# Patient Record
Sex: Female | Born: 1935 | Race: White | Hispanic: No | Marital: Married | Smoking: Never smoker
Health system: Southern US, Community
[De-identification: ages and names within clinical notes are randomized; demographics above are authoritative.]

## PROBLEM LIST (undated history)

## (undated) DIAGNOSIS — M1990 Unspecified osteoarthritis, unspecified site: Secondary | ICD-10-CM

## (undated) DIAGNOSIS — E785 Hyperlipidemia, unspecified: Secondary | ICD-10-CM

## (undated) DIAGNOSIS — Z90710 Acquired absence of both cervix and uterus: Secondary | ICD-10-CM

## (undated) DIAGNOSIS — K648 Other hemorrhoids: Secondary | ICD-10-CM

## (undated) DIAGNOSIS — E559 Vitamin D deficiency, unspecified: Secondary | ICD-10-CM

## (undated) DIAGNOSIS — R51 Headache: Secondary | ICD-10-CM

## (undated) DIAGNOSIS — I951 Orthostatic hypotension: Secondary | ICD-10-CM

## (undated) DIAGNOSIS — K5900 Constipation, unspecified: Secondary | ICD-10-CM

## (undated) DIAGNOSIS — J309 Allergic rhinitis, unspecified: Secondary | ICD-10-CM

## (undated) DIAGNOSIS — K599 Functional intestinal disorder, unspecified: Secondary | ICD-10-CM

## (undated) DIAGNOSIS — I872 Venous insufficiency (chronic) (peripheral): Secondary | ICD-10-CM

## (undated) DIAGNOSIS — I2510 Atherosclerotic heart disease of native coronary artery without angina pectoris: Secondary | ICD-10-CM

## (undated) DIAGNOSIS — I253 Aneurysm of heart: Secondary | ICD-10-CM

## (undated) DIAGNOSIS — Z800 Family history of malignant neoplasm of digestive organs: Secondary | ICD-10-CM

## (undated) DIAGNOSIS — I10 Essential (primary) hypertension: Secondary | ICD-10-CM

## (undated) DIAGNOSIS — F32A Depression, unspecified: Secondary | ICD-10-CM

## (undated) DIAGNOSIS — K219 Gastro-esophageal reflux disease without esophagitis: Secondary | ICD-10-CM

## (undated) DIAGNOSIS — I071 Rheumatic tricuspid insufficiency: Secondary | ICD-10-CM

## (undated) DIAGNOSIS — G8929 Other chronic pain: Secondary | ICD-10-CM

## (undated) DIAGNOSIS — I6523 Occlusion and stenosis of bilateral carotid arteries: Secondary | ICD-10-CM

## (undated) DIAGNOSIS — I87009 Postthrombotic syndrome without complications of unspecified extremity: Secondary | ICD-10-CM

## (undated) DIAGNOSIS — F329 Major depressive disorder, single episode, unspecified: Secondary | ICD-10-CM

## (undated) DIAGNOSIS — I83899 Varicose veins of unspecified lower extremities with other complications: Secondary | ICD-10-CM

## (undated) DIAGNOSIS — R519 Headache, unspecified: Secondary | ICD-10-CM

## (undated) DIAGNOSIS — I4891 Unspecified atrial fibrillation: Secondary | ICD-10-CM

## (undated) DIAGNOSIS — E039 Hypothyroidism, unspecified: Secondary | ICD-10-CM

## (undated) DIAGNOSIS — K625 Hemorrhage of anus and rectum: Secondary | ICD-10-CM

## (undated) DIAGNOSIS — F419 Anxiety disorder, unspecified: Secondary | ICD-10-CM

## (undated) HISTORY — DX: Constipation, unspecified: K59.00

## (undated) HISTORY — DX: Acquired absence of both cervix and uterus: Z90.710

## (undated) HISTORY — DX: Postthrombotic syndrome without complications of unspecified extremity: I87.009

## (undated) HISTORY — DX: Headache, unspecified: R51.9

## (undated) HISTORY — PX: TONSILLECTOMY: SUR1361

## (undated) HISTORY — PX: CARDIAC CATHETERIZATION: SHX172

## (undated) HISTORY — DX: Other hemorrhoids: K64.8

## (undated) HISTORY — PX: NASAL SINUS SURGERY: SHX719

## (undated) HISTORY — PX: KNEE SURGERY: SHX244

## (undated) HISTORY — PX: GANGLION CYST EXCISION: SHX1691

## (undated) HISTORY — DX: Venous insufficiency (chronic) (peripheral): I87.2

## (undated) HISTORY — DX: Varicose veins of unspecified lower extremity with other complications: I83.899

## (undated) HISTORY — DX: Essential (primary) hypertension: I10

## (undated) HISTORY — DX: Depression, unspecified: F32.A

## (undated) HISTORY — DX: Major depressive disorder, single episode, unspecified: F32.9

## (undated) HISTORY — DX: Headache: R51

## (undated) HISTORY — DX: Orthostatic hypotension: I95.1

## (undated) HISTORY — DX: Occlusion and stenosis of bilateral carotid arteries: I65.23

## (undated) HISTORY — DX: Hyperlipidemia, unspecified: E78.5

## (undated) HISTORY — PX: CERVICAL SPINE SURGERY: SHX589

## (undated) HISTORY — DX: Other chronic pain: G89.29

## (undated) HISTORY — DX: Unspecified osteoarthritis, unspecified site: M19.90

## (undated) HISTORY — PX: OTHER SURGICAL HISTORY: SHX169

## (undated) HISTORY — DX: Hemorrhage of anus and rectum: K62.5

## (undated) HISTORY — DX: Unspecified atrial fibrillation: I48.91

## (undated) HISTORY — DX: Allergic rhinitis, unspecified: J30.9

## (undated) HISTORY — DX: Atherosclerotic heart disease of native coronary artery without angina pectoris: I25.10

## (undated) HISTORY — DX: Family history of malignant neoplasm of digestive organs: Z80.0

## (undated) HISTORY — PX: CATARACT EXTRACTION W/ INTRAOCULAR LENS IMPLANT: SHX1309

## (undated) HISTORY — PX: APPENDECTOMY: SHX54

## (undated) HISTORY — DX: Functional intestinal disorder, unspecified: K59.9

## (undated) HISTORY — DX: Vitamin D deficiency, unspecified: E55.9

## (undated) HISTORY — DX: Rheumatic tricuspid insufficiency: I07.1

## (undated) HISTORY — DX: Gastro-esophageal reflux disease without esophagitis: K21.9

## (undated) HISTORY — PX: TOTAL KNEE ARTHROPLASTY: SHX125

## (undated) HISTORY — DX: Anxiety disorder, unspecified: F41.9

## (undated) HISTORY — DX: Aneurysm of heart: I25.3

## (undated) HISTORY — DX: Hypothyroidism, unspecified: E03.9

## (undated) HISTORY — PX: ABDOMINAL HYSTERECTOMY: SHX81

---

## 2001-07-31 ENCOUNTER — Other Ambulatory Visit: Admission: RE | Admit: 2001-07-31 | Discharge: 2001-07-31 | Payer: Self-pay | Admitting: Family Medicine

## 2001-08-25 ENCOUNTER — Encounter: Payer: Self-pay | Admitting: Orthopedic Surgery

## 2001-09-02 ENCOUNTER — Inpatient Hospital Stay (HOSPITAL_COMMUNITY): Admission: RE | Admit: 2001-09-02 | Discharge: 2001-09-07 | Payer: Self-pay | Admitting: Orthopedic Surgery

## 2001-10-15 ENCOUNTER — Encounter: Admission: RE | Admit: 2001-10-15 | Discharge: 2001-11-12 | Payer: Self-pay | Admitting: Orthopedic Surgery

## 2002-06-18 ENCOUNTER — Encounter: Admission: RE | Admit: 2002-06-18 | Discharge: 2002-06-18 | Payer: Self-pay | Admitting: Family Medicine

## 2002-06-18 ENCOUNTER — Encounter: Payer: Self-pay | Admitting: Family Medicine

## 2002-11-10 ENCOUNTER — Encounter: Payer: Self-pay | Admitting: Gastroenterology

## 2003-04-16 ENCOUNTER — Ambulatory Visit (HOSPITAL_COMMUNITY): Admission: RE | Admit: 2003-04-16 | Discharge: 2003-04-16 | Payer: Self-pay | Admitting: Family Medicine

## 2003-04-16 ENCOUNTER — Encounter: Payer: Self-pay | Admitting: Family Medicine

## 2003-07-01 ENCOUNTER — Encounter: Admission: RE | Admit: 2003-07-01 | Discharge: 2003-07-01 | Payer: Self-pay | Admitting: Family Medicine

## 2003-07-01 ENCOUNTER — Encounter: Payer: Self-pay | Admitting: Family Medicine

## 2003-07-19 ENCOUNTER — Ambulatory Visit (HOSPITAL_COMMUNITY): Admission: RE | Admit: 2003-07-19 | Discharge: 2003-07-19 | Payer: Self-pay | Admitting: Cardiology

## 2003-07-19 ENCOUNTER — Encounter: Payer: Self-pay | Admitting: Cardiology

## 2003-08-30 ENCOUNTER — Encounter: Payer: Self-pay | Admitting: Gastroenterology

## 2003-09-13 ENCOUNTER — Ambulatory Visit (HOSPITAL_COMMUNITY): Admission: RE | Admit: 2003-09-13 | Discharge: 2003-09-13 | Payer: Self-pay | Admitting: Gastroenterology

## 2003-09-13 ENCOUNTER — Encounter: Payer: Self-pay | Admitting: Gastroenterology

## 2004-07-06 ENCOUNTER — Encounter: Admission: RE | Admit: 2004-07-06 | Discharge: 2004-07-06 | Payer: Self-pay | Admitting: Family Medicine

## 2004-11-08 ENCOUNTER — Ambulatory Visit: Payer: Self-pay | Admitting: Family Medicine

## 2004-12-18 ENCOUNTER — Ambulatory Visit: Payer: Self-pay | Admitting: Family Medicine

## 2005-03-09 ENCOUNTER — Inpatient Hospital Stay (HOSPITAL_COMMUNITY): Admission: EM | Admit: 2005-03-09 | Discharge: 2005-03-11 | Payer: Self-pay | Admitting: Emergency Medicine

## 2005-03-09 ENCOUNTER — Ambulatory Visit: Payer: Self-pay | Admitting: Cardiology

## 2005-03-11 ENCOUNTER — Ambulatory Visit: Payer: Self-pay

## 2005-03-13 ENCOUNTER — Ambulatory Visit: Payer: Self-pay | Admitting: Cardiology

## 2005-03-20 ENCOUNTER — Ambulatory Visit: Payer: Self-pay | Admitting: Cardiology

## 2005-04-16 ENCOUNTER — Ambulatory Visit: Payer: Self-pay | Admitting: Family Medicine

## 2005-04-16 ENCOUNTER — Encounter: Admission: RE | Admit: 2005-04-16 | Discharge: 2005-04-16 | Payer: Self-pay | Admitting: Family Medicine

## 2005-08-01 ENCOUNTER — Ambulatory Visit: Payer: Self-pay | Admitting: Gastroenterology

## 2005-08-02 ENCOUNTER — Encounter: Admission: RE | Admit: 2005-08-02 | Discharge: 2005-08-02 | Payer: Self-pay | Admitting: Family Medicine

## 2005-08-06 ENCOUNTER — Ambulatory Visit: Payer: Self-pay | Admitting: Cardiology

## 2005-09-03 ENCOUNTER — Ambulatory Visit: Payer: Self-pay | Admitting: Gastroenterology

## 2005-09-10 ENCOUNTER — Ambulatory Visit: Payer: Self-pay | Admitting: Gastroenterology

## 2005-09-24 ENCOUNTER — Ambulatory Visit: Payer: Self-pay | Admitting: Gastroenterology

## 2005-10-08 ENCOUNTER — Encounter (INDEPENDENT_AMBULATORY_CARE_PROVIDER_SITE_OTHER): Payer: Self-pay | Admitting: *Deleted

## 2005-10-08 ENCOUNTER — Ambulatory Visit (HOSPITAL_COMMUNITY): Admission: RE | Admit: 2005-10-08 | Discharge: 2005-10-08 | Payer: Self-pay | Admitting: Obstetrics and Gynecology

## 2005-12-18 ENCOUNTER — Ambulatory Visit: Payer: Self-pay | Admitting: Family Medicine

## 2006-02-20 ENCOUNTER — Encounter: Admission: RE | Admit: 2006-02-20 | Discharge: 2006-02-20 | Payer: Self-pay | Admitting: Otolaryngology

## 2006-07-10 ENCOUNTER — Ambulatory Visit: Payer: Self-pay | Admitting: Cardiology

## 2006-08-05 ENCOUNTER — Encounter: Admission: RE | Admit: 2006-08-05 | Discharge: 2006-08-05 | Payer: Self-pay | Admitting: Family Medicine

## 2006-10-20 ENCOUNTER — Ambulatory Visit: Payer: Self-pay | Admitting: Gastroenterology

## 2006-10-21 ENCOUNTER — Ambulatory Visit: Payer: Self-pay | Admitting: Gastroenterology

## 2006-11-12 ENCOUNTER — Ambulatory Visit: Payer: Self-pay | Admitting: Gastroenterology

## 2006-12-06 ENCOUNTER — Emergency Department (HOSPITAL_COMMUNITY): Admission: EM | Admit: 2006-12-06 | Discharge: 2006-12-06 | Payer: Self-pay | Admitting: Emergency Medicine

## 2006-12-08 ENCOUNTER — Ambulatory Visit: Payer: Self-pay | Admitting: Family Medicine

## 2006-12-11 ENCOUNTER — Ambulatory Visit: Payer: Self-pay | Admitting: Family Medicine

## 2006-12-26 ENCOUNTER — Ambulatory Visit: Payer: Self-pay | Admitting: Family Medicine

## 2006-12-26 LAB — CONVERTED CEMR LAB
ALT: 12 units/L (ref 0–40)
AST: 18 units/L (ref 0–37)
Alkaline Phosphatase: 79 units/L (ref 39–117)
BUN: 18 mg/dL (ref 6–23)
Basophils Relative: 1 % (ref 0.0–1.0)
Calcium: 9.4 mg/dL (ref 8.4–10.5)
Cholesterol: 196 mg/dL (ref 0–200)
Creatinine, Ser: 1.1 mg/dL (ref 0.4–1.2)
HCT: 38.3 % (ref 36.0–46.0)
Hemoglobin: 13.4 g/dL (ref 12.0–15.0)
Lymphocytes Relative: 26.8 % (ref 12.0–46.0)
MCV: 89.9 fL (ref 78.0–100.0)
Platelets: 333 10*3/uL (ref 150–400)
Potassium: 3.6 meq/L (ref 3.5–5.1)
RDW: 12.8 % (ref 11.5–14.6)
Triglyceride fasting, serum: 128 mg/dL (ref 0–149)
VLDL: 26 mg/dL (ref 0–40)
WBC: 5.2 10*3/uL (ref 4.5–10.5)

## 2007-06-30 ENCOUNTER — Ambulatory Visit: Payer: Self-pay | Admitting: Cardiology

## 2007-08-10 ENCOUNTER — Encounter: Admission: RE | Admit: 2007-08-10 | Discharge: 2007-08-10 | Payer: Self-pay | Admitting: *Deleted

## 2007-08-11 ENCOUNTER — Ambulatory Visit: Payer: Self-pay

## 2007-08-12 DIAGNOSIS — M1990 Unspecified osteoarthritis, unspecified site: Secondary | ICD-10-CM

## 2007-08-12 DIAGNOSIS — I10 Essential (primary) hypertension: Secondary | ICD-10-CM

## 2007-08-12 DIAGNOSIS — J309 Allergic rhinitis, unspecified: Secondary | ICD-10-CM

## 2007-08-24 ENCOUNTER — Ambulatory Visit: Payer: Self-pay | Admitting: Cardiology

## 2008-08-05 ENCOUNTER — Ambulatory Visit: Payer: Self-pay | Admitting: Cardiology

## 2008-12-05 ENCOUNTER — Encounter: Admission: RE | Admit: 2008-12-05 | Discharge: 2008-12-05 | Payer: Self-pay | Admitting: Family Medicine

## 2009-03-07 ENCOUNTER — Ambulatory Visit: Payer: Self-pay | Admitting: Cardiology

## 2009-04-08 DIAGNOSIS — I951 Orthostatic hypotension: Secondary | ICD-10-CM

## 2009-04-08 DIAGNOSIS — E785 Hyperlipidemia, unspecified: Secondary | ICD-10-CM | POA: Insufficient documentation

## 2009-12-25 ENCOUNTER — Telehealth: Payer: Self-pay | Admitting: Gastroenterology

## 2009-12-25 ENCOUNTER — Encounter: Payer: Self-pay | Admitting: Gastroenterology

## 2009-12-27 ENCOUNTER — Encounter: Admission: RE | Admit: 2009-12-27 | Discharge: 2009-12-27 | Payer: Self-pay | Admitting: Family Medicine

## 2010-01-22 ENCOUNTER — Ambulatory Visit: Payer: Self-pay | Admitting: Gastroenterology

## 2010-01-23 ENCOUNTER — Ambulatory Visit: Payer: Self-pay | Admitting: Gastroenterology

## 2010-03-15 DIAGNOSIS — I2510 Atherosclerotic heart disease of native coronary artery without angina pectoris: Secondary | ICD-10-CM

## 2010-03-21 ENCOUNTER — Ambulatory Visit: Payer: Self-pay | Admitting: Cardiology

## 2010-08-21 ENCOUNTER — Encounter: Admission: RE | Admit: 2010-08-21 | Discharge: 2010-08-21 | Payer: Self-pay | Admitting: Family Medicine

## 2011-01-03 ENCOUNTER — Encounter
Admission: RE | Admit: 2011-01-03 | Discharge: 2011-01-03 | Payer: Self-pay | Source: Home / Self Care | Attending: Internal Medicine | Admitting: Internal Medicine

## 2011-01-29 NOTE — Procedures (Signed)
Summary: EGD   EGD  Procedure date:  09/13/2003  Findings:      Location: [facility]  211.9: Neoplasia,Benign,GI Tract Patient Name: [name] [name], [name] A. MRN: [id number] Procedure Procedures: Panendoscopy (EGD) CPT: 43235.    with Banding of Polyp Personnel: Endoscopist: Barbette Hair. Arlyce Dice, MD.  Indications  Surveillance of: Gastric polyp(s).  Comments: Bleeding gastric polyp History  Pre-Exam Physical: Performed Sep 13, 2003  Entire physical exam was normal.  Exam Exam Info: Maximum depth of insertion Duodenum, intended Duodenum. Vocal cords visualized. Gastric retroflexion performed. ASA Classification: I. Tolerance: good.  Sedation Meds: Robinul 0.2 given IV. Fentanyl 50 mcg. given IV. Versed 5 mg. given IV. Cetacaine Spray 2 sprays given aerosolized.  Monitoring: BP and pulse monitoring done. Oximetry used. Supplemental O2 given at 2 Liters.  Findings POLYP: in Antrum. Maximum size: 15 mm. ICD9: Neoplasia, Benign, GI Tract, NEC/NOS: 211.9.  - Banding: Antrum. 2 bands were placed, Outcome: successful.   Assessment Abnormal examination, see findings above.  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   Events  Unplanned Intervention: No unplanned interventions were required.  Unplanned Events: There were no complications. Plans Medication(s): Continue current medications.  Scheduling: Office Visit, to [employer]. Arlyce Dice, MD, around Oct 13, 2003.  Blood Tests, CBC on Sep 13, 2003.    This report was created from the original endoscopy report, which was reviewed and signed by the above listed endoscopist.    cc. Tinnie Gens Todd,MD

## 2011-01-29 NOTE — Procedures (Signed)
Summary: Colonoscopy/GCDD  Colonoscopy/GCDD   Imported By: Sherian Rein 01/23/2010 09:58:50  _____________________________________________________________________  External Attachment:    Type:   Image     Comment:   External Document

## 2011-01-29 NOTE — Procedures (Signed)
Summary: Colonoscopy   Colonoscopy  Procedure date:  09/10/2005  Findings:      Results: Hemorrhoids.     Location:  [facility].   Patient Name: [name] [name], [name] A. MRN: [id number] Procedure Procedures: Colonoscopy CPT: [phone].  Personnel: Endoscopist: Barbette Hair. Arlyce Dice, MD.  Patient Consent: Procedure, Alternatives, Risks and Benefits discussed, consent obtained, from patient.  Indications Symptoms: Abdominal pain / bloating. Change in bowel habits.  History  Current Medications: Patient is not currently taking Coumadin.  Pre-Exam Physical: Performed Sep 10, 2005. Cardio-pulmonary exam, HEENT exam , Abdominal exam, Mental status exam WNL.  Exam Exam: Extent of exam reached: Cecum, extent intended: Cecum.  The cecum was identified by IC valve. Colon retroflexion performed. ASA Classification: II. Tolerance: good.  Monitoring: Pulse and BP monitoring, Oximetry used. Supplemental O2 given. at 2 Liters.  Colon Prep Used Miralax for colon prep. Prep results: good.  Sedation Meds: Patient assessed and found to be appropriate for moderate (conscious) sedation. Sedation was managed by the Endoscopist. Fentanyl 100 mcg. given IV. Versed 10 mg. given IV.  Findings - MELANOSIS: Cecum to Rectum.  HEMORRHOIDS: Internal. ICD9: Hemorrhoids, Internal: 455.0.   Assessment Abnormal examination, see findings above.  Diagnoses: 455.0: Hemorrhoids, Internal.   Events  Unplanned Interventions: No intervention was required.  Unplanned Events: There were no complications. Plans  Post Exam Instructions: Post sedation instructions given.  Medication Plan: Fiber supplements: Bran 1 Tbsp QD, starting Sep 10, 2005   Disposition: After procedure patient sent to recovery. After recovery patient sent home.  Scheduling/Referral: Office Visit, to [employer]. Arlyce Dice, MD, around Sep 24, 2005.    This report was created from the original endoscopy report, which was reviewed  and signed by the above listed endoscopist.    cc. Tinnie Gens Todd,MD     Annice Pih

## 2011-01-29 NOTE — Procedures (Signed)
Summary: Colonoscopy  Patient: [name] [name] Note: All result statuses are Final unless otherwise noted.  Tests: (1) Colonoscopy (COL)   COL Colonoscopy           DONE     [facility]     [address]     [place], [place]  [address]           COLONOSCOPY PROCEDURE REPORT           PATIENT:  [name], [name]  MR#:  [id number]     BIRTHDATE:  [birth year], 77 yrs. old  GENDER:  female           ENDOSCOPIST:  Barbette Hair. Arlyce Dice, MD     Referred by:           PROCEDURE DATE:  01/23/2010     PROCEDURE:  Colonoscopy, Diagnostic     ASA CLASS:  Class II     INDICATIONS:  family history of colon cancer (mother), change in     bowel habits, Elevated Risk Screening           MEDICATIONS:   Fentanyl 75 mcg IV, Versed 9 mg IV, Benadryl 12.5     mg IV           DESCRIPTION OF PROCEDURE:   After the risks benefits and     alternatives of the procedure were thoroughly explained, informed     consent was obtained.  Digital rectal exam was performed and     revealed no abnormalities.   The LB [id number] endoscope     was introduced through the anus and advanced to the cecum, which     was identified by both the appendix and ileocecal valve, without     limitations.  The quality of the prep was excellent, using     MoviPrep.  The instrument was then slowly withdrawn as the colon     was fully examined.     <<PROCEDUREIMAGES>>           FINDINGS:  Moderate diverticulosis was found in the sigmoid colon     (see image10). Short segment of diverticula with muscular     hypertrophy and lumenal narrowing  This was otherwise a normal     examination of the colon (see image1, image2, image3, image6,     image7, image8, image11, and image12).   Retroflexed views in the     rectum revealed no abnormalities.    The scope was then withdrawn     from the patient and the procedure completed.           COMPLICATIONS:  None           ENDOSCOPIC IMPRESSION:     1) Moderate diverticulosis in the  sigmoid colon     2) Otherwise normal examination     RECOMMENDATIONS:     1) high fiber diet     2) Given your significant family history of colon cancer, you     should have a repeat colonoscopy in 5 years           REPEAT EXAM:  In 5 year(s) for Colonoscopy.           ______________________________     Barbette Hair. Arlyce Dice, MD           CC:  Joselyn Arrow, MD           n.     Rosalie DoctorBarbette Hair. Mandrell Vangilder at 01/23/2010 04:09 PM  [address] [place], [phone]  Note: An exclamation mark (!) indicates a result that was not dispersed into the flowsheet. Document Creation Date: 01/23/2010 4:09 PM _______________________________________________________________________  (1) Order result status: Final Collection or observation date-time: 01/23/2010 16:01 Requested date-time:  Receipt date-time:  Reported date-time:  Referring Physician:   Ordering Physician: Melvia Heaps [id number]) Specimen Source:  Source: Launa Grill Order Number: [id number] Lab site:   Appended Document: Colonoscopy    Clinical Lists Changes  Observations: Added new observation of COLONNXTDUE: 12/2014 (01/23/2010 17:24)

## 2011-01-29 NOTE — Letter (Signed)
Summary: [facility] Instructions  [facility]  [address] [place], [place] [address]   Phone: [phone]  Fax: [phone]       [name] [name]    [birth year]    MRN: [id number]        Procedure Day /Date:TUESDAY 01/23/2010     Arrival Time:3PM     Procedure Time:4PM    Location of Procedure:                    X  [facility] (4th Floor)   PREPARATION FOR COLONOSCOPY WITH MOVIPREP   Starting 5 days prior to your procedure 01/18/2010 do not eat nuts, seeds, popcorn, corn, beans, peas,  salads, or any raw vegetables.  Do not take any fiber supplements (e.g. Metamucil, Citrucel, and Benefiber).  THE DAY BEFORE YOUR PROCEDURE         DATE:01/22/2010  DAY: MONDAY  1.  Drink clear liquids the entire day-NO SOLID FOOD  2.  Do not drink anything colored red or purple.  Avoid juices with pulp.  No orange juice.  3.  Drink at least 64 oz. (8 glasses) of fluid/clear liquids during the day to prevent dehydration and help the prep work efficiently.  CLEAR LIQUIDS INCLUDE: Water Jello Ice Popsicles Tea (sugar ok, no milk/cream) Powdered fruit flavored drinks Coffee (sugar ok, no milk/cream) Gatorade Juice: apple, white grape, white cranberry  Lemonade Clear bullion, consomm, broth Carbonated beverages (any kind) Strained chicken noodle soup Hard Candy                             4.  In the morning, mix first dose of MoviPrep solution:    Empty 1 Pouch A and 1 Pouch B into the disposable container    Add lukewarm drinking water to the top line of the container. Mix to dissolve    Refrigerate (mixed solution should be used within 24 hrs)  5.  Begin drinking the prep at 5:00 p.m. The MoviPrep container is divided by 4 marks.   Every 15 minutes drink the solution down to the next mark (approximately 8 oz) until the full liter is complete.   6.  Follow completed prep with 16 oz of clear liquid of your choice (Nothing red or purple).  Continue to drink  clear liquids until bedtime.  7.  Before going to bed, mix second dose of MoviPrep solution:    Empty 1 Pouch A and 1 Pouch B into the disposable container    Add lukewarm drinking water to the top line of the container. Mix to dissolve    Refrigerate  THE DAY OF YOUR PROCEDURE      DATE:01/23/2010 DAY: TUESDAY  Beginning at 11:00_a.m. (5 hours before procedure):         1. Every 15 minutes, drink the solution down to the next mark (approx 8 oz) until the full liter is complete.  2. Follow completed prep with 16 oz. of clear liquid of your choice.    3. You may drink clear liquids until 2PM (2 HOURS BEFORE PROCEDURE).   MEDICATION INSTRUCTIONS  Unless otherwise instructed, you should take regular prescription medications with a small sip of water   as early as possible the morning of your procedure.        OTHER INSTRUCTIONS  You will need a responsible adult at least 75 years of age to accompany you and drive you home.  This person must remain in the waiting room during your procedure.  Wear loose fitting clothing that is easily removed.  Leave jewelry and other valuables at home.  However, you may wish to bring a book to read or  an iPod/MP3 player to listen to music as you wait for your procedure to start.  Remove all body piercing jewelry and leave at home.  Total time from sign-in until discharge is approximately 2-3 hours.  You should go home directly after your procedure and rest.  You can resume normal activities the  day after your procedure.  The day of your procedure you should not:   Drive   Make legal decisions   Operate machinery   Drink alcohol   Return to work  You will receive specific instructions about eating, activities and medications before you leave.    The above instructions have been reviewed and explained to me by   _______________________    I fully understand and can verbalize these instructions  _____________________________ Date _________

## 2011-01-29 NOTE — Procedures (Signed)
Summary: EGD   EGD  Procedure date:  10/21/2006  Findings:      Findings: Normal  Location: [facility]   Patient Name: [name] [name], [name] A. MRN: [id number] Procedure Procedures: Panendoscopy (EGD) CPT: 43235.  Personnel: Endoscopist: Barbette Hair. Arlyce Dice, MD.  Indications Symptoms: Abdominal pain,  History  Current Medications: Patient is not currently taking Coumadin.  Comments: Patient history reviewed and updated, pre-procedure physical performed prior to initiation of sedation? Pre-Exam Physical: Performed Oct 21, 2006  Cardio-pulmonary exam, HEENT exam, Abdominal exam, Mental status exam WNL.  Comments: Patient history reviewed and updated, pre-procedure physical performed prior to initiation of sedation?yes Exam Exam Info: Maximum depth of insertion Duodenum, intended Duodenum. Vocal cords visualized. Gastric retroflexion performed. ASA Classification: II. Tolerance: excellent.  Sedation Meds: Fentanyl 50 mcg. given IV. Versed 6 mg. given IV. Cetacaine Spray 2 sprays given aerosolized. Mylicon 1 cc  Monitoring: BP and pulse monitoring done. Oximetry used. Supplemental O2 given at 2 Liters.  Findings - Normal: Proximal Esophagus to Duodenal 2nd Portion.   Assessment Normal examination.  Events  Unplanned Intervention: No unplanned interventions were required.  Unplanned Events: There were no complications. Plans Instructions: No aspirin or non-steroidal containing medications: Hold mobic for 5 days.  Medication(s): Continue current medications.  Comments: Call if not improved in 5 days. Disposition: After procedure patient sent to recovery. After recovery patient sent home.  Scheduling: Office Visit, to [employer]. Arlyce Dice, MD, around Nov 11, 2006.    This report was created from the original endoscopy report, which was reviewed and signed by the above listed endoscopist.     cc.Jeffrey Todd,MD    Annice Pih

## 2011-01-29 NOTE — Procedures (Signed)
Summary: EGD/GCDD  EGD/GCDD   Imported By: Sherian Rein 01/23/2010 10:00:46  _____________________________________________________________________  External Attachment:    Type:   Image     Comment:   External Document

## 2011-01-29 NOTE — Assessment & Plan Note (Signed)
Summary: ABD.PAIN X 3 YEARS. FAMILY HX. COLON CANCER.        [name] [name]    History of Present Illness Visit Type: Initial Visit Primary GI MD: Melvia Heaps MD [facility] Primary Provider: Toney Rakes Requesting Provider: n/a Chief Complaint: Abdominal pain, has small bowel movements and she has pain trying to have a BM History of Present Illness:   [name] [name] has returned for evaluation of change in bowel habits.  Last colonoscopy was 2006 which demonstrated hemorrhoids.  She is now complaining of decreased stool caliber, nonspecific lower bowel discomfort and straining at the stool.  She may develop nausea and limited vomiting and having a bowel movement.  She denies melena or hematochezia.  She is also complaining of weight gain.  Her mother died from colon cancer.   GI Review of Systems    Reports abdominal pain, acid reflux, bloating, heartburn, loss of appetite, nausea, vomiting, and  weight gain.      Denies belching, chest pain, dysphagia with liquids, dysphagia with solids, vomiting blood, and  weight loss.      Reports change in bowel habits, constipation, fecal incontinence, hemorrhoids, and  rectal bleeding.     Denies anal fissure, black tarry stools, diarrhea, diverticulosis, heme positive stool, irritable bowel syndrome, jaundice, light color stool, liver problems, and  rectal pain.    Current Medications (verified): 1)  Trandolapril 1 Mg Tabs (Trandolapril) 2)  Flonase 50 Mcg/act  Susp (Fluticasone Propionate) 3)  Lipitor 20 Mg  Tabs (Atorvastatin Calcium) 4)  Zyrtec Allergy 10 Mg  Tabs (Cetirizine Hcl) 5)  Premarin 0.3 Mg  Tabs (Estrogens Conjugated) 6)  Topamax 100 Mg  Tabs (Topiramate) 7)  Mavik 1 Mg  Tabs (Trandolapril) 8)  Nexium 40 Mg  Pack (Esomeprazole Magnesium) 9)  Levoxyl 100 Mcg  Tabs (Levothyroxine Sodium) 10)  Cymbalta 20 Mg Cpep (Duloxetine Hcl) .Marland Kitchen.. 1 By Mouth Once Daily 11)  Tenoretic 50 50-25 Mg  Tabs (Atenolol-Chlorthalidone)  Allergies (verified): 1)  !  Sulfa 2)  ! Asa 3)  ! Prednisone  Past History:  Past Medical History: Last updated: 01/18/2010 Current Problems:  CAD, UNSPECIFIED SITE (ICD-414.00) HYPERLIPIDEMIA-MIXED (ICD-272.4) HYPOTENSION, ORTHOSTATIC (ICD-458.0) OSTEOARTHRITIS (ICD-715.90) HYPERTENSION (ICD-401.9) FAMILY HISTORY OF COLON CA 1ST DEGREE RELATIVE <60 (ICD-V16.0) ALLERGIC RHINITIS (ICD-477.9) Internal Hemorrhoids Adenomatous Polyp 11/10/2002  Past Surgical History: CB x1 Cardiac Cath Total knee replacement Sinus surgery Cyst Removal on back Tonsillectomy Hysterectomy Appendectomy  Review of Systems       The patient complains of allergy/sinus, arthritis/joint pain, back pain, cough, depression-new, fatigue, headaches-new, hearing problems, itching, shortness of breath, skin rash, and urine leakage.  The patient denies anemia, anxiety-new, blood in urine, breast changes/lumps, change in vision, confusion, coughing up blood, fainting, fever, heart murmur, heart rhythm changes, menstrual pain, muscle pains/cramps, night sweats, nosebleeds, pregnancy symptoms, sleeping problems, sore throat, swelling of feet/legs, swollen lymph glands, thirst - excessive , urination - excessive , urination changes/pain, vision changes, and voice change.         All other systems were reviewed and were negative   Vital Signs:  Patient profile:   77 year old female Height:      66 inches Weight:      183 pounds BMI:     29.64 BSA:     1.93 Pulse rate:   64 / minute Pulse rhythm:   regular BP sitting:   122 / 78  (left arm)  Vitals Entered By: Merri Ray CMA Duncan Dull) (January 22, 2010 2:45 PM)  Physical Exam  Additional Exam:  She is a well-developed well-nourished female  skin: anicteric HEENT: normocephalic; PEERLA; no nasal or pharyngeal abnormalities neck: supple nodes: no cervical lymphadenopathy chest: clear to ausculatation and percussion heart: no murmurs, gallops, or rubs abd: soft, nontender; BS  normoactive; no abdominal masses, organomegaly; there is minimal tenderness in the right and left lower quadrant to palpation rectal: deferred ext: no cynanosis, clubbing, edema skeletal: no deformities neuro: oriented x 3; no focal abnormalities    Impression & Recommendations:  Problem # 1:  OTHER CONSTIPATION (ICD-564.09)  Patient most likely has functional constipation.  A structural abnormality of the colon is less likely.  Recommendations #1 fiber supplementation #2 colonoscopy  Orders: Colonoscopy (Colon)  Problem # 2:  FAMILY HISTORY OF COLON CA 1ST DEGREE RELATIVE <60 (ICD-V16.0)  Plan colonoscopy  Orders: Colonoscopy (Colon)  Patient Instructions: 1)  CC Dr. Elesa Massed 2)  Colonoscopy and Flexible Sigmoidoscopy brochure given.  3)  Conscious Sedation brochure given.  4)  Your colonoscopy is scheduled for 01/23/2010 at 4pm 5)  You can pick up your moviprep from your pharmacy today 6)  The medication list was reviewed and reconciled.  All changed / newly prescribed medications were explained.  A complete medication list was provided to the patient / caregiver. Prescriptions: MOVIPREP 100 GM  SOLR (PEG-KCL-NACL-NASULF-NA ASC-C) As per prep instructions.  #1 x 0   Entered by:   Merri Ray CMA (AAMA)   Authorized by:   Louis Meckel MD   Signed by:   Merri Ray CMA (AAMA) on 01/22/2010   Method used:   Print then Give to Patient   RxID:   [id number]

## 2011-01-29 NOTE — Assessment & Plan Note (Signed)
Summary: yearly f/u /cy  Medications Added LIPITOR 20 MG  TABS (ATORVASTATIN CALCIUM) 1 tab at bedtime PREMARIN 0.3 MG  TABS (ESTROGENS CONJUGATED) 1 tab once daily TOPAMAX 100 MG  TABS (TOPIRAMATE) 1 tab once daily NEXIUM 40 MG  PACK (ESOMEPRAZOLE MAGNESIUM) 1 tab once daily LEVOXYL 100 MCG  TABS (LEVOTHYROXINE SODIUM) 1 tab once daily CYMBALTA 30 MG CPEP (DULOXETINE HCL) 1 tab once daily ATENOLOL 50 MG TABS (ATENOLOL) 1 tab once daily CELEBREX 200 MG CAPS (CELECOXIB) 1 cap once daily        Visit Type:  1 yr f/u Referring Provider:  n/a Primary Provider:  Toney Rakes  CC:  sob w/walking...pt has recently broke out in a rash...edema/left foot...pt says she is having her "veins worked on"..denies any cp.  History of Present Illness: Mrs Hosterman returns today for followup of her history coronary artery disease and hypertension.  She's been doing remarkably well. She is totally asymptomatic. She denies any angina, dyspnea on exertion, orthopnea, PND. She has had some peripheral edema.  Current Medications (verified): 1)  Lipitor 20 Mg  Tabs (Atorvastatin Calcium) .Marland Kitchen.. 1 Tab At Bedtime 2)  Premarin 0.3 Mg  Tabs (Estrogens Conjugated) .Marland Kitchen.. 1 Tab Once Daily 3)  Topamax 100 Mg  Tabs (Topiramate) .Marland Kitchen.. 1 Tab Once Daily 4)  Nexium 40 Mg  Pack (Esomeprazole Magnesium) .Marland Kitchen.. 1 Tab Once Daily 5)  Levoxyl 100 Mcg  Tabs (Levothyroxine Sodium) .Marland Kitchen.. 1 Tab Once Daily 6)  Cymbalta 30 Mg Cpep (Duloxetine Hcl) .Marland Kitchen.. 1 Tab Once Daily 7)  Atenolol 50 Mg Tabs (Atenolol) .Marland Kitchen.. 1 Tab Once Daily 8)  Celebrex 200 Mg Caps (Celecoxib) .Marland Kitchen.. 1 Cap Once Daily  Allergies: 1)  ! Sulfa 2)  ! Asa 3)  ! Prednisone  Past History:  Past Medical History: Last updated: 03/15/2010 CAD, NATIVE VESSEL (ICD-414.01) HYPERLIPIDEMIA-MIXED (ICD-272.4) HYPOTENSION, ORTHOSTATIC (ICD-458.0) HYPERTENSION (ICD-401.9) OSTEOARTHRITIS (ICD-715.90) FAMILY HISTORY OF COLON CA 1ST DEGREE RELATIVE <60 (ICD-V16.0) ALLERGIC RHINITIS  (ICD-477.9) OTHER CONSTIPATION (ICD-564.09)    Past Surgical History: Last updated: 01/22/2010 CB x1 Cardiac Cath Total knee replacement Sinus surgery Cyst Removal on back Tonsillectomy Hysterectomy Appendectomy  Family History: Last updated: 08/12/2007 Family History of Brain Cancer Family History of Flu Epidemic Family History of Colon CA 1st degree relative <60 Family History of Cardiovascular disorder  Social History: Last updated: 08/12/2007 Retired Single Never Smoked Alcohol use-no  Risk Factors: Smoking Status: never (08/12/2007)  Review of Systems       negative other than history of present illness  Vital Signs:  Patient profile:   77 year old female Height:      66 inches Weight:      182 pounds BMI:     29.48 Pulse rate:   61 / minute Pulse rhythm:   irregular BP sitting:   106 / 70  (left arm) Cuff size:   large  Vitals Entered By: Danielle Rankin, CMA (March 21, 2010 2:22 PM)  Physical Exam  General:  obese.   Head:  normocephalic and atraumatic Eyes:  PERRLA/EOM intact; conjunctiva and lids normal. Neck:  Neck supple, no JVD. No masses, thyromegaly or abnormal cervical nodes. Chest Jarrod Mcenery:  no deformities or breast masses noted Lungs:  Clear bilaterally to auscultation and percussion. Heart:  Non-displaced PMI, chest non-tender; regular rate and rhythm, S1, S2 without murmurs, rubs or gallops. Carotid upstroke normal, no bruit. Normal abdominal aortic size, no bruits. Femorals normal pulses, no bruits. Pedals normal pulses. No edema, no varicosities. Abdomen:  Bowel sounds positive; abdomen soft and non-tender without masses, organomegaly, or hernias noted. No hepatosplenomegaly. Msk:  Back normal, normal gait. Muscle strength and tone normal. Pulses:  pulses normal in all 4 extremities Extremities:  trace left pedal edema and 1+ right pedal edema.   Neurologic:  Alert and oriented x 3. Skin:  Intact without lesions or rashes. Psych:  Normal  affect.   EKG  Procedure date:  03/21/2010  Findings:      normal sinus rhythm, low voltage QRS, septal infarct pattern, no change  Impression & Recommendations:  Problem # 1:  CAD, NATIVE VESSEL (ICD-414.01) Assessment Unchanged  The following medications were removed from the medication list:    Trandolapril 1 Mg Tabs (Trandolapril)    Mavik 1 Mg Tabs (Trandolapril) Her updated medication list for this problem includes:    Atenolol 50 Mg Tabs (Atenolol) .Marland Kitchen... 1 tab once daily  Orders: EKG w/ Interpretation (93000)  Problem # 2:  HYPOTENSION, ORTHOSTATIC (ICD-458.0) Assessment: Improved  Problem # 3:  HYPERTENSION (ICD-401.9) Assessment: Improved  The following medications were removed from the medication list:    Trandolapril 1 Mg Tabs (Trandolapril)    Mavik 1 Mg Tabs (Trandolapril) Her updated medication list for this problem includes:    Atenolol 50 Mg Tabs (Atenolol) .Marland Kitchen... 1 tab once daily  Patient Instructions: 1)  Your physician recommends that you schedule a follow-up appointment in: YEAR WITH DR Agatha Duplechain 2)  Your physician recommends that you continue on your current medications as directed. Please refer to the Current Medication list given to you today.

## 2011-05-01 ENCOUNTER — Encounter: Payer: Self-pay | Admitting: Cardiology

## 2011-05-02 ENCOUNTER — Encounter: Payer: Self-pay | Admitting: Cardiology

## 2011-05-02 ENCOUNTER — Encounter: Payer: Self-pay | Admitting: *Deleted

## 2011-05-02 ENCOUNTER — Ambulatory Visit (INDEPENDENT_AMBULATORY_CARE_PROVIDER_SITE_OTHER): Payer: Medicare Other | Admitting: Cardiology

## 2011-05-02 VITALS — BP 150/78 | HR 59 | Ht 64.0 in | Wt 180.0 lb

## 2011-05-02 DIAGNOSIS — I2510 Atherosclerotic heart disease of native coronary artery without angina pectoris: Secondary | ICD-10-CM

## 2011-05-02 DIAGNOSIS — R079 Chest pain, unspecified: Secondary | ICD-10-CM

## 2011-05-02 MED ORDER — ISOSORBIDE MONONITRATE ER 60 MG PO TB24: 60.0000 mg | ORAL_TABLET | ORAL | Status: DC

## 2011-05-02 MED ORDER — NITROGLYCERIN 0.4 MG SL SUBL: 0.4000 mg | SUBLINGUAL_TABLET | SUBLINGUAL | Status: DC | PRN

## 2011-05-02 MED ORDER — ASPIRIN EC 81 MG PO TBEC: 81.0000 mg | DELAYED_RELEASE_TABLET | Freq: Every day | ORAL | Status: DC

## 2011-05-02 NOTE — Patient Instructions (Addendum)
Your physician has requested that you have a cardiac catheterization. Cardiac catheterization is used to diagnose and/or treat various heart conditions. Doctors may recommend this procedure for a number of different reasons. The most common reason is to evaluate chest pain. Chest pain can be a symptom of coronary artery disease (CAD), and cardiac catheterization can show whether plaque is narrowing or blocking your heart's arteries. This procedure is also used to evaluate the valves, as well as measure the blood flow and oxygen levels in different parts of your heart. For further information please visit [email or web address] Please follow instruction sheet, as given.  Your catheterization is scheduled for Monday May 7,2012 at 10:30 am  Your physician has recommended you make the following change in your medication:  START:  Imdur and Aspirin.   Nitroglycerin

## 2011-05-02 NOTE — Assessment & Plan Note (Signed)
Nonobstructive by cath in 2004.  As noted above, proceed with cardiac cath.

## 2011-05-02 NOTE — Progress Notes (Signed)
Addended by: Lisabeth Devoid on: 05/02/2011 04:49 PM   Modules accepted: Orders

## 2011-05-02 NOTE — Assessment & Plan Note (Signed)
She is describing symptoms that sound like exertional angina.  Her EKG is nonacute.  She is not having unstable symptoms or rest symptoms.  She was also seen by Dr. Daleen Squibb today.  We have recommended proceeding with cardiac cath.  Risks and benefits of cardiac catheterization have been discussed with the patient.  These include bleeding, infection, kidney damage, stroke, heart attack, death.  The patient understands these risks and is willing to proceed.  She has been intolerant to ASA in the past due to GI side effects.  She is on Nexium now and will try ASA 81 mg QD.  We will give her NTG to use PRN.  Also, start her on Imdur 60 mg QD.

## 2011-05-02 NOTE — Assessment & Plan Note (Signed)
Managed by PCP

## 2011-05-02 NOTE — Assessment & Plan Note (Signed)
Elevated today.  Start Imdur as noted.

## 2011-05-02 NOTE — Progress Notes (Addendum)
History of Present Illness: Primary Cardiologist:  Dr. Valera Castle  [name] [name] is a 77 y.o. female with a h/o nonobstructive CAD at cath in 2004 who presents for annual follow up.  Cardiac cath in 7/04 demonstrated LAD 30%, then 50-60% leading into the apical LAD, ostial D1 50% and mid D1 50%.  Myoview in 8/08 was negative for ischemia.  She notes over the last few months the onset of exertional chest tightness and dyspnea that resolves with rest.  No radiating symptoms or associated nausea, diaphoresis or syncope.  She does not feel that her symptoms are particularly getting worse.  She denies rest symptoms.  She denies orthopnea, PND or edema.    Past Medical History  Diagnosis Date  . Coronary artery disease   . Hyperlipidemia   . Orthostatic hypotension   . Hypertension   . Osteoarthrosis, unspecified whether generalized or localized, unspecified site   . Family history of malignant neoplasm of gastrointestinal tract   . Allergic rhinitis, cause unspecified   . Unspecified functional disorder of intestine   . H/O: hysterectomy   . Osteoporosis   . Anxiety and depression   . GERD (gastroesophageal reflux disease)   . Hypothyroidism   . Chronic headaches     Current Outpatient Prescriptions  Medication Sig Dispense Refill  . acetaminophen (TYLENOL) 325 MG tablet Take one half tablet by mouth as needed       . alendronate (FOSAMAX) 70 MG tablet Take 70 mg by mouth every 7 (seven) days. Take with a full glass of water on an empty stomach.       Marland Kitchen atenolol (TENORMIN) 50 MG tablet Take 50 mg by mouth daily.        Marland Kitchen atorvastatin (LIPITOR) 20 MG tablet Take 20 mg by mouth daily.        . celecoxib (CELEBREX) 200 MG capsule Take 200 mg by mouth daily.        . DULoxetine (CYMBALTA) 30 MG capsule Take 30 mg by mouth daily.        . ergocalciferol (VITAMIN D2) 50000 UNITS capsule Take 50,000 Units by mouth once a week.        . esomeprazole (NEXIUM) 40 MG capsule Take 40 mg by mouth  daily.        Marland Kitchen estrogens, conjugated, (PREMARIN) 0.3 MG tablet Take 0.3 mg by mouth daily.        Marland Kitchen levothyroxine (SYNTHROID, LEVOTHROID) 112 MCG tablet Take 112 mcg by mouth daily.        . NON FORMULARY Allergy shot weekly, allergic to grass, some trees, dust mites       . topiramate (TOPAMAX) 100 MG tablet Take 100 mg by mouth daily.        Marland Kitchen DISCONTD: levothyroxine (SYNTHROID, LEVOTHROID) 100 MCG tablet Take 100 mcg by mouth daily.          Allergies  Allergen Reactions  . Aspirin Other (See Comments)  . Prednisone   . Sulfonamide Derivatives    History  Substance Use Topics  . Smoking status: Never Smoker   . Smokeless tobacco: Not on file   Comment: was a passive smoker for 15 yrs  . Alcohol Use: No    Family History  Problem Relation Age of Onset  . Cancer Other     brain, colon 1st relative < 60  . Heart disease Other   . Colon cancer Mother   . Heart attack Father 59    ROS:  Please see HPI.  She has had some tingling in her legs.  She has varicosities.  No claudication symptoms.  No fevers, cough, melena, hematochezia, hematuria.  All other systems reviewed and negative.  Vital Signs: BP 150/78  Pulse 59  Ht 5\' 4"  (1.626 m)  Wt 180 lb (81.647 kg)  BMI 30.90 kg/m2  PHYSICAL EXAM: Well nourished, well developed, in no acute distress HEENT: normal Vascular: no carotid bruits Endocrine: no thyromegaly Neck: no JVD Cardiac:  normal S1, S2; RRR; no murmur, no gallops Lungs:  clear to auscultation bilaterally, no wheezing, rhonchi or rales Abd: soft, nontender, no hepatomegaly Ext: no edema, several varicosities and spider veins noted Skin: warm and dry Neuro:  CNs 2-12 intact, no focal abnormalities noted Psych: normal affect  EKG:  Sinus bradycardia, HR 59, normal axis, no ischemic changes.  ASSESSMENT AND PLAN: I have evaluated patient with Tereso Newcomer [facility] and agree with need for adding ASA and Imdur. Diagnostic cath next week recommended to patient.

## 2011-05-03 ENCOUNTER — Telehealth: Payer: Self-pay | Admitting: Cardiology

## 2011-05-03 LAB — BASIC METABOLIC PANEL
BUN: 16 mg/dL (ref 6–23)
Chloride: 107 mEq/L (ref 96–112)
Creatinine, Ser: 1.1 mg/dL (ref 0.4–1.2)
Glucose, Bld: 88 mg/dL (ref 70–99)

## 2011-05-03 LAB — CBC WITH DIFFERENTIAL/PLATELET
Basophils Relative: 2.8 % (ref 0.0–3.0)
Eosinophils Absolute: 0.2 10*3/uL (ref 0.0–0.7)
Eosinophils Relative: 2.5 % (ref 0.0–5.0)
Hemoglobin: 12.3 g/dL (ref 12.0–15.0)
MCHC: 33.8 g/dL (ref 30.0–36.0)
MCV: 89.6 fl (ref 78.0–100.0)
Monocytes Absolute: 0.5 10*3/uL (ref 0.1–1.0)
Neutro Abs: 4.1 10*3/uL (ref 1.4–7.7)
RBC: 4.05 Mil/uL (ref 3.87–5.11)
WBC: 6.8 10*3/uL (ref 4.5–10.5)

## 2011-05-03 LAB — PROTIME-INR: Prothrombin Time: 12.3 s (ref 10.2–12.4)

## 2011-05-03 NOTE — Telephone Encounter (Signed)
MISSED YOUR RETURNED CALL AT HOME NOW. HAS QUESTION ABOUT MEDS

## 2011-05-03 NOTE — Telephone Encounter (Signed)
Pt is schedule for heart cath on Monday. Pt wants to know if she needs any meds before getting the cath done on Monday.

## 2011-05-03 NOTE — Telephone Encounter (Signed)
Pt wants to make sure we know that she normally has to take antibiotics ( Amoxcillin) prior to dental procedures. She had a right total knee replacement 7 years ago ( Dr. Priscille Kluver).  She was told by him to always take antibiotics prior to invasive procedure.  I told her I would forward this to both Dr. Daleen Squibb  And Dr. Excell Seltzer.  Also, if she were to need pre-cath antibiotics they would administer those IV prior to procedure.  Pt reassured. Mylo Red RN

## 2011-05-06 ENCOUNTER — Inpatient Hospital Stay (HOSPITAL_BASED_OUTPATIENT_CLINIC_OR_DEPARTMENT_OTHER)
Admission: RE | Admit: 2011-05-06 | Discharge: 2011-05-06 | Disposition: A | Discharge status: Home / Self Care | Payer: Medicare Other | Source: Ambulatory Visit | Attending: Cardiovascular Disease | Admitting: Cardiovascular Disease

## 2011-05-06 DIAGNOSIS — I2510 Atherosclerotic heart disease of native coronary artery without angina pectoris: Secondary | ICD-10-CM | POA: Insufficient documentation

## 2011-05-06 DIAGNOSIS — E785 Hyperlipidemia, unspecified: Secondary | ICD-10-CM | POA: Insufficient documentation

## 2011-05-06 DIAGNOSIS — R0789 Other chest pain: Secondary | ICD-10-CM | POA: Insufficient documentation

## 2011-05-06 DIAGNOSIS — R0989 Other specified symptoms and signs involving the circulatory and respiratory systems: Secondary | ICD-10-CM | POA: Insufficient documentation

## 2011-05-06 DIAGNOSIS — R0609 Other forms of dyspnea: Secondary | ICD-10-CM | POA: Insufficient documentation

## 2011-05-06 DIAGNOSIS — I10 Essential (primary) hypertension: Secondary | ICD-10-CM | POA: Insufficient documentation

## 2011-05-12 NOTE — Cardiovascular Report (Signed)
[name] [name], [name] [name]                 ACCOUNT NO.:  [id number]  MEDICAL RECORD NO.:  [id number]           PATIENT TYPE:  LOCATION:                                 FACILITY:  PHYSICIAN:  Verne Carrow, MDDATE OF BIRTH:  09-09-1933  DATE OF PROCEDURE:  05/06/2011 DATE OF DISCHARGE:                           CARDIAC CATHETERIZATION   PRIMARY CARDIOLOGIST:  Maisie Fus C. Wall, MD, [facility]  PROCEDURE PERFORMED: 1. Left heart catheterization. 2. Selective coronary angiography. 3. Left ventricular angiogram.  OPERATOR:  Verne Carrow, MD  INDICATIONS:  This is a 76 year old Caucasian female with a history of coronary artery disease by cath 2004 as well as a history of hyperlipidemia and hypertension who has had recent complaints of exertional chest tightness and dyspnea that resolves with rest.  The patient was seen in the office by Tereso Newcomer, PA-C and it was felt that a diagnostic cardiac catheterization was indicated to exclude progression of her moderate nonobstructive coronary artery disease.  I was asked to perform the diagnostic catheterization.  PROCEDURE IN DETAIL:  The patient was brought to the outpatient cardiac catheterization laboratory after signing informed consent for the procedure.  The right groin was prepped and draped in sterile fashion. Lidocaine 1% was used for local anesthesia.  A 4-French sheath was inserted into the right femoral artery without difficulty.  Selective coronary angiography was performed with standard diagnostic catheters. A pigtail catheter was used to perform a left ventricular angiogram. The patient tolerated the procedure well and was taken to the recovery area in stable condition.  HEMODYNAMIC FINDINGS:  Central aortic pressure 158/68.  Left ventricular pressure 178/16/18.  ANGIOGRAPHIC FINDINGS: 1. The left main coronary artery bifurcated into the circumflex and     left anterior descending artery and had no obstructive  disease. 2. The left anterior descending was a large vessel that coursed to the     apex and gave off a moderate-sized diagonal branch that arose from     the proximal portion of the vessel and a moderate-sized diagonal     branch that arose from the midportion of the vessel.  The first     diagonal branch had a 50% stenosis.  The second diagonal branch had     plaque disease only.  The left anterior descending artery had an     ostial 20% stenosis.  There were serial 30% lesions throughout the     mid vessel.  None of these lesions appear to be flow-limiting. 3. Circumflex artery gave off a small-caliber early obtuse marginal     branch and a moderate-sized second obtuse marginal branch.  There     were no lesions noted in this vessel. 4. The right coronary artery was a large dominant vessel with several     30% stenoses in the mid vessel but no flow-limiting lesions. 5. Left ventricular angiogram was performed in the RAO projection and     showed normal left ventricular systolic function with ejection     fraction of 65%. IMPRESSION: 1. Mild-to-moderate nonobstructive coronary artery disease. 2. Normal left ventricular systolic function.  RECOMMENDATIONS:  I do not  see any lesions that would be causing chest pain and dyspnea in this patient.  I would recommend continued medical management.     Verne Carrow, MD     CM/MEDQ  D:  05/06/2011  T:  05/07/2011  Job:  [id number]  cc:   Thomas C. Daleen Squibb, MD, [facility]  Electronically Signed by Verne Carrow MD on 05/12/2011 10:15:23 PM

## 2011-05-14 NOTE — Assessment & Plan Note (Signed)
[facility]                            CARDIOLOGY OFFICE NOTE   NAME:[name] [name], [name] [name]                        MRN:          [id number]  DATE:06/30/2007                            DOB:          11/02/1933    REASON FOR VISIT:  Ms. Garver returns today for management of the  following issues:  1. Nonobstructive coronary disease by heart catheterization July 19, 2003.  She is having no angina or ischemic equivalence at present.      Last stress Myoview March 11, 2005 with ejection fraction 70% with      no ischemia.  2. Hyperlipidemia, being followed by primary care.  3. Hypertension.   Her biggest complaint is that of dizziness when she gets up from a  sitting position or gets out of her car.  She says her blood pressure  has been running lower than usual.  She denies any other symptoms.   MEDICATIONS:  Her medications are:  1. Mavik 1 mg per day.  2. Premarin.  3. Nexium 40 mg daily.  4. Lipitor 20 mg daily.  5. Levoxyl 100 mcg per day.  6. Cymbalta 30 mg a day.  7. Celebrex 200 mg a day.  8. Allergy shots q. weekly.   PHYSICAL EXAMINATION:  VITAL SIGNS:  Her blood pressure was initially  80/58.  We rechecked it after she had been in for a while and in the  left arm it was about 102 systolic.  Her pulse was 67 and regular.  Weight is 181.  HEENT:  Normocephalic, atraumatic.  Pupils equal, round, reactive to  light and accommodation. Extraocular movements intact. Sclerae clear.  Facial symmetry is normal.  NECK:  Carotids are full without bruits.  LUNGS:  Clear.  HEART:  Reveals a regular rate and rhythm without murmurs, rubs or  gallops.  PMI was poorly appreciated.  ABDOMEN:  Soft with good bowel sounds.  EXTREMITIES:  Revealed no edema.  Pulses are intact.  NEUROLOGICAL:  Exam is intact.   CLINICAL DATA:  Electrocardiogram is normal.   IMPRESSION AND PLANS:  Ms. Winburn is worrisome in that her blood pressure  is running low and she is  symptomatic with orthostatic hypotension.  I  have asked her to eliminate her Mavik since she has normal left  ventricular function and has no diabetes.  She will continue with her  Atenolol/HCTZ.   I have arranged for her to have an exercise rest stress Myoview for  followup.  If this is negative for ischemia, I will see her back in a  year.     Thomas C. Daleen Squibb, MD, [facility]  Electronically Signed    TCW/MedQ  DD: 06/30/2007  DT: 07/01/2007  Job #: [id number]

## 2011-05-14 NOTE — Assessment & Plan Note (Signed)
[facility]                            CARDIOLOGY OFFICE NOTE   NAME:[name] [name]                        MRN:          [id number]  DATE:08/05/2008                            DOB:          08-25-1933    [name] [name] returns today for management of the following issues:  1. Nonobstructive coronary artery disease by cath, July 19, 2003.  She      is currently having no angina or ischemic equivalents.  Stress      Myoview August 11, 2007, showed good exercise tolerance, EF 80%.      There is a false drop in her O2 sat which we confirmed later by      walking her in the office.  2. History of orthostatic hypotension.  3. Hypertension.  4. Hyperlipidemia being followed by primary care.   Last time she was here, we discontinued her Mavik.  She feels better.  Her only cardiac meds at present are atenolol 50 mg a day and Lipitor 20  mg daily.  Her labs have been stable.   She denies any orthopnea, PND, peripheral edema, and no orthostatic  symptoms.   PHYSICAL EXAMINATION:  VITAL SIGNS:  Her blood pressure today is 136/75.  Her pulse is 65 and regular.  Weight is 178, down 5.  HEENT:  Unchanged.  NECK:  Carotids upstrokes are equal bilaterally without bruits.  No JVD.  Thyroid is not enlarged.  Trachea is midline.  LUNGS:  Clear.  HEART:  Regular rate and rhythm.  No gallop.  ABDOMEN:  Soft, good bowel sounds.  EXTREMITIES:  No cyanosis, clubbing, or edema.  Pulses are intact.  NEURO:  Intact.   EKG shows sinus rhythm with no ST-segment changes.   [name] [name] is doing well.  I have made no changes in her medical program.  We will plan on seeing her back again in a year.     Thomas C. Daleen Squibb, MD, [facility]  Electronically Signed    TCW/MedQ  DD: 08/05/2008  DT: 08/06/2008  Job #: [id number]

## 2011-05-14 NOTE — Assessment & Plan Note (Signed)
[name] [name]                            CARDIOLOGY OFFICE NOTE   NAME:[name] [name], [name] [name]                        MRN:          [id number]  DATE:03/07/2009                            DOB:          08/10/1933    [name] [name] comes in today for followup.  She has had some atypical well-  localized chest discomfort.  At this time, she describes as a pinprick.  She has no exertional discomfort.   She has a history of nonobstructive coronary artery disease by cath in  July 2004.  Stress Myoview on August 11, 2007, showed good exercise  tolerance, EF of 80%.   She has a history of orthostatic hypotension.  Her blood pressure is  actually elevated today.  We had to stop her Mavik in the past because  of the orthostasis.   MEDICATIONS:  1. Cymbalta 30 mg daily.  2. Celebrex 200 mg per day.  3. Allergy shots every week.  4. Atenolol 50 mg a day.  5. Nexium 40 mg a day.  6. Topamax 100 mg per day.  7. Zyrtec 10 mg per day.  8. Premarin 0.3 mg per day.  9. Lipitor 20 mg per day.  10.Levoxyl 112 mcg a day.   PHYSICAL EXAMINATION:  VITAL SIGNS:  Her blood pressure today is 158/90,  pulse is 60 and regular, and weight is 177.  HEENT:  Normal.  NECK:  Supple.  Carotid upstrokes were equal bilaterally without bruits,  no JVD.  Thyroid is not enlarged.  Trachea is midline.  LUNGS:  Clear to  auscultation and percussion.  HEART:  A poorly appreciated PMI.  Normal S1 and S2.  ABDOMEN:  Soft, good bowel sounds.  No midline bruit.  No tenderness.  EXTREMITIES:  No cyanosis or clubbing, but there is trace edema.  Pulses  are intact.  NEURO:  Intact.   EKG showed sinus rhythm with septal infarct pattern, but no change from  before.   ASSESSMENT AND PLAN:  [name] [name] is stable from my standpoint.  I have  asked to watch her blood pressure closely and if it is running  consistently above 140/90, to let us know or her primary care physician.  I will plan on seeing her back  again in a year.      Thomas C. Daleen Squibb, MD, [facility]  Electronically Signed    TCW/MedQ  DD: 03/07/2009  DT: 03/08/2009  Job #: [id number]

## 2011-05-17 NOTE — Cardiovascular Report (Signed)
NAMEKAMIRAH, SHUGRUE                             ACCOUNT NO.:  [id number]   MEDICAL RECORD NO.:  [id number]                   PATIENT TYPE:  OIB   LOCATION:  2899                                 FACILITY:  [facility]   PHYSICIAN:  Arturo Morton. Riley Kill, M.D.             DATE OF BIRTH:  06-30-1933   DATE OF PROCEDURE:  07/19/2003  DATE OF DISCHARGE:                              CARDIAC CATHETERIZATION   INDICATIONS:  Ms. Hipps is a 77 year old who has had some recurrent episodes  of chest pain.  Her Cardiolite was nonischemic.  However, she is referred  for further evaluation because of continued discomfort.  The current study  was done to assess coronary anatomy.   PROCEDURES:  1. Left heart catheterization.  2. Selective coronary arteriography.  3. Selective left ventriculography.  4. Intracoronary nitroglycerin.   DESCRIPTION OF PROCEDURE:  The procedure was performed from the right  femoral artery using 6 French catheters.  She tolerated the procedure well.  She was taken to the holding area in satisfactory clinical condition.   HEMODYNAMIC DATA:  1. Central aorta:  167/73, mean 109.  2. Left ventricle:  161/11.  3. No gradient on pullback across the aortic valve.   ANGIOGRAPHIC DATA:  1. Left ventriculography revealed normal global systolic function.  No     segmental abnormalities or contraction were identified.  Ejection     fraction was felt to be normal on post PVC beat.  2. The left main coronary artery was free of critical disease.  3. There is a focal eccentric stenosis of 30% in the mid vessel. Distally     after the second diagonal takeoff, there is an eccentric 50-60% stenosis     leading to the apical LAD.  The apical LAD is widely patent.  There is a     first diagonal which also has about 50% ostial narrowing and a 50% mid     narrowing.  The remainder of the vessel is without critical narrowing.  4. The circumflex provides two marginal branches and is free of  critical     disease.  5. The right coronary artery demonstrates posterior descending and posterior     lateral system, all of which are free of critical disease.   CONCLUSIONS:  1. Well preserved left ventricular function.  2. Scattered lesions of the left coronary system as noted above.    DISPOSITION:  I would favor medical regimen.  The LAD lesions do not appear  to be critical enough to recommend a percutaneous stent procedure.  The  ostium of the diagonal is at least moderate and could be the source of chest  pain, especially if its exercise provoked. A medical regimen would be most  appropriate for this at the present time.  I will have her see Dr. Corinda Gubler  back in followup to reassess her progress.  Arturo Morton. Riley Kill, M.D.    TDS/MEDQ  D:  07/19/2003  T:  07/19/2003  Job:  [id number]  CV laboratory   E. Graceann Congress, M.D.   cc:   CV laboratory   E. Graceann Congress, M.D.

## 2011-05-17 NOTE — Op Note (Signed)
NAME:  [name] [name]                 ACCOUNT NO.:  [id number]   MEDICAL RECORD NO.:  [id number]          PATIENT TYPE:  AMB   LOCATION:  SDC                           FACILITY:  [facility]   PHYSICIAN:  Leighton Roach Meisinger, M.D.DATE OF BIRTH:  Aug 08, 1933   DATE OF PROCEDURE:  10/08/2005  DATE OF DISCHARGE:                                 OPERATIVE REPORT   PREOPERATIVE DIAGNOSIS:  Abdominal and pelvic pain and possible retained  ovaries.   POSTOPERATIVE DIAGNOSIS:  Abdominal and pelvic adhesions and probable  retained portions of bilateral fallopian tubes.   PROCEDURE:  Open laparoscopy with adhesiolysis and removal of bilateral  pelvic masses.   SURGEON:  Zenaida Niece, M.D.   ASSISTANTS:  None.   ANESTHESIA:  General endotracheal tube.   SPECIMENS:  Bilateral pelvic masses.   ESTIMATED BLOOD LOSS:  Minimal.   COMPLICATIONS:  None.   FINDINGS:  She had extensive adhesions of the omentum and sigmoid colon to  the left anterior abdominal wall and pelvis. There was a tubular structure  on the right pelvic sidewall that appeared to be a retained portion on the  right fallopian tube. There was a structure in the left pelvis which  appeared to be a retained portion of the distal fallopian tube.   PROCEDURE IN DETAIL:  The patient was taken to the operating room and placed  in the dorsal supine position. She was then placed in mobile stirrups.  General anesthesia was induced. The abdomen was prepped and draped in the  usual sterile fashion and a Foley catheter inserted. Infraumbilical skin was  infiltrated with 0.25% Marcaine and a 3 cm horizontal incision was made. The  fascia was identified and elevated and entered sharply. The peritoneum was  entered bluntly. There were no significant adhesions beneath this incision.  A pursestring suture of #0 Vicryl was placed around this and the Hassan  cannula was placed. The abdomen was insufflated with CO2 gas. A 5 mm port  was then placed  under direct visualization on the left side. Inspection  revealed the above-mentioned findings. Using the harmonic scalpel Ace  device, the adhesions of the omentum to the anterior abdominal wall were  taken down to the pelvis. There were extensive adhesions of the omentum and  sigmoid colon to the left pelvis and across to the right side. These were  taken down with careful dissection with the harmonic scalpel. A small  portion of these adhesions remained in the right pelvis that were too dense  to take down through the scope. Most of the adhesions were freed up. There  was a structure along the right pelvic sidewall which appeared to be a  retained portion on the right fallopian tube. This was grasped with a  grasper and then using the harmonic scalpel, it was removed from the pelvic  sidewall. The ureter was visualized inferior to any incision lines.  Hemostasis was achieved with the harmonic scalpel and this mass was removed  through the scope and sent as a specimen from the right pelvic sidewall.  This appeared again to be  a portion of the right fallopian tube. In the left  pelvis, there was a structure which appeared to be the distal left fallopian  tube. This was grasped and freed from surrounding tissues with the harmonic  scalpel with adequate hemostasis. Indigo carmine was given IV and when she  did have blue urine the right ureter appeared intact and I was not able to  identify but do not feel I was close to the left ureter. The bladder also  appeared intact. All sites were irrigated and found to be hemostatic. The 5  mm port was then removed under direct visualization. The Hassan cannula was  then removed and gas allowed to deflate from the abdomen. The previously  placed pursestring suture was tied. There was a small defect in the fascia  closed with another suture of zero Vicryl. After this the fascia was felt to  be intact. Skin incisions were closed with interrupted  subcuticular sutures  of 4-0 Vicryl followed by Dermabond. The patient was extubated in the  operating room and taken to the recovery room in stable condition after  tolerating the procedure well. The counts were correct x2 and she had PAS  hose on throughout the procedure. She did receive 1 gram of Ancef  prophylaxis due to the fact that she has had a total right knee replacement.      Zenaida Niece, M.D.  Electronically Signed     TDM/MEDQ  D:  10/08/2005  T:  10/08/2005  Job:  [id number]

## 2011-05-17 NOTE — Assessment & Plan Note (Signed)
 HEALTHCARE                           GASTROENTEROLOGY OFFICE NOTE   NAME:Jarrett, [name] [name]                        MRN:          [id number]  DATE:11/12/2006                            DOB:          March 04, 1933    Ms. Scaletta has returned for a scheduled follow up.  She underwent upper  endoscopy October 23 which was entirely unremarkable.  She has been pain  free since discontinuing her Mobic.  She continues to feel well.   PHYSICAL EXAMINATION:  VITAL SIGNS:  Pulse 64, blood pressure 100/66, weight  175.  HEENT:  EOMI. PERRLA. Sclerae are anicteric.  Conjunctivae are pink.  NECK:  Supple without thyromegaly, adenopathy or carotid bruits.  CHEST:  Clear to auscultation and percussion without adventitious sounds.  CARDIAC:  Regular rhythm; normal S1 S2.  There are no murmurs, gallops or  rubs.  ABDOMEN:  Bowel sounds are normoactive.  Abdomen is soft, non-tender and non-  distended.  There are no abdominal masses, tenderness, splenic enlargement  or hepatomegaly.  EXTREMITIES:  Full range of motion.  No cyanosis, clubbing or edema.  RECTAL:  There are no masses.  Stool is Hemoccult negative.   IMPRESSION:  Abdominal discomfort - very likely related to her NSAIDs.   RECOMMENDATION:  Trial of Celebrex 200 mg a day.  Should she develop  recurrent abdominal pain, then this will have to be discontinued, as well.     Barbette Hair. Arlyce Dice, MD,FACG  Electronically Signed    RDK/MedQ  DD: 11/13/2006  DT: 11/13/2006  Job #: [id number]   cc:   Tinnie Gens A. Tawanna Cooler, MD

## 2011-05-17 NOTE — Discharge Summary (Signed)
[place]. [facility]  Patient:    JERMIYA, REICHL Visit Number: [id number] MRN: [id number]          Service Type: SUR Location: [id number] Attending Physician:  Carolan Shiver Ii Dictated by:   Jamelle Rushing, P.A. Admit Date:  09/02/2001 Discharge Date: 09/07/2001   CC:         Evette Georges, M.D. [facility]   Discharge Summary  ADMISSION DIAGNOSES: 1. End-stage osteoarthritis, right knee. 2. Hypertension. 3. Hypothyroidism. 4. Asthma. 5. Migraines. 6. History of cervical cancer.  DISCHARGE DIAGNOSES: 1. Right total knee arthroplasty. 2. Postoperative blood loss, anemia. 3. Hypertension. 4. Hyperthyroidism. 5. Asthma 6. Migraines.  HISTORY OF PRESENT ILLNESS:  The patient is a 77 year old white female who presents with a 1-1/2-year history of gradual onset of progressively worsening right knee pain. The patient has no history of surgery or a fall on her knee in the past.  The pain is described as a constant ache with sharp pains over the joint with radiation to the proximal tibia. It increases with walking and decreases with Darvocet. She does state that it gives way.  She does have edema, night pain, and she is currently using a cane for ambulation.  DRUG ALLERGIES:  SULFA  CURRENT MEDICATIONS:  1. Ziac 25 mg p.o.q.d.  2. Levoxyl 0.125 mg q.d.  3. Darvocet q.6 h. p.r.n.  4. Vioxx 25 mg q.h.s.  5. Topamax 50 mg 2 tablets p.o. q.h.s.  6. Premarin 0.625 mg q.h.s.  7. Celexa 20 mg q.h.s.  8. Vitaplex Plus 1 tablet q.h.s.  9. Zomig 2.5 mg p.r.n. 10. Claritin 10 mg q.h.s. 11. Flonase 1 spray each nare q.d. p.r.n. 12. Azmacort 2 puffs q.d. p.r.n.  SURGICAL PROCEDURE:  On September 07, 2001, the patient was taken to the OR by Jonny Ruiz L. Rendall, M.D.  assisted by Arnoldo Morale, P.A.C.. Under general anesthesia, the patient underwent a right LCS total knee replacement.  The patient tolerated the procedure well. There were no complications.  Operative time was 1 hour and 10 minutes.  A femoral nerve block was in place prior to leaving the recovery room and the patient was transferred to the recovery room in good condition.  CONSULTS:  On September 02, 2001, the following routine consults were requested:  Physical therapy, occupational therapy, rehab, care management.  HOSPITAL COURSE:  On September 02, 2001, the patient was admitted to [facility] in the care of Dr. Jonny Ruiz L. Rendall. The patient was taken to the OR where a right total knee arthroplasty cemented was performed. The patient tolerated this procedure well and was transferred to the recovery room in good condition.  The patient was placed on Arixtra 2.5 mg subcu q.24 h. for a total of seven days for routine DVT prophylaxis. The patient incurred a five day postoperative course in which she did develop some postoperative blood loss anemia. Her H&H dropped to 7.7/22.8, but after two units of packed blood cells she did improve to 9.2/26.5.  The patient had no other untoward events during her hospitalization. She worked well with physical therapy and it was felt on postop day #5, she was ready for discharge to home with vital signs stable, her leg neurovascularly intact, her wound benign without any signs of infection.  LABORATORIES:  On September 6th, the venous Doppler was performed to rule out DVT. There is no evidence of a DVT thrombus or Bakers cyst bilaterally. EKG on admission: Sinus bradycardia, anterior infarct, age  undetermined at 55 beats per minute.  On September 9th, WBC 7.0, hemoglobin 8.0, hematocrit 23.2, platelets 257.  Routine chemistries: Sodium 130, potassium 3.8, glucose 108, BUN 8, creatinine 0.9.  Urinalysis: Normal on admission.  MEDICATIONS ON DISCHARGE FROM HOSPITAL:  1. Tylenol 650 mg p.o. q.4 h. p.r.n.  2. Beclomethasone spray 1 spray q.d. p.r.n.  3. Ziac 1 tab p.o. q.d.  4. Celexa 20 mg p.o. q.h.s.  5. Premarin 0.625 mg  p.o. q.h.s.  6. Benadryl 25 mg p.o. q.6 h. p.r.n.  7. Colace 100 mg p.o. b.i.d.  8. Flovent one puff MDI p.r.n.  9. Arixtra 2.5 mg subcu q.24 h. for a total of seven days. 10. Synthroid 0.125 mg p.o. q.d. 11. Claritin 10 mg p.o. q.h.s. 12. Multivitamins with minerals one capsule p.o. q.h.s. 13. Zomig 2.5 mg p.o. p.r.n. 14. Percocet 1 or 2 tablets every 4 to 6 hours p.r.n. pain. 15. Topamax 50 mg p.o. q.h.s. 16. Trinsicon 1 capsule p.o.t.i.d.  DISCHARGE INSTRUCTIONS: MEDICATIONS:  1. Arixtra 2.5 mg subcu for two more days.  2. Percocet 1 tablet by mouth every four hours for pain if needed.  3. OxyContin CR 10 mg 1 tablet every 12 hours for pain.  4. Enteric-coated aspirin after completion of Arixtra for one month.  ACTIVITY:  Weightbearing as tolerated to the right leg using a walker.  DIET:  No restrictions  WOUND CARE:  Keep wound clean and dry. Change dressing as needed.  SPECIAL INSTRUCTIONS:  Call doctor if develop fever greater than 101.5, chills, pain not relieved by pain meds, foul-smelling discharge from incision.  FOLLOWUP:  Call office at [phone] for a follow-up appointment with Dr. Priscille Kluver in nine days.  CONDITION ON DISCHARGE: The patients condition on discharge to home is listed as good. Dictated by:   Jamelle Rushing, P.A. Attending Physician:  Carolan Shiver Ii DD:  10/12/01 TD:  10/12/01 Job: [id number]

## 2011-05-17 NOTE — H&P (Signed)
[place]. [facility]  Patient:    [name] [name], [name] [name] Visit Number: [id number] MRN: [id number]          Service Type: Attending:  Carlisle Beers. Dorothyann Gibbs, M.D. Dictated by:   Arnoldo Morale, P.A. Adm. Date:  09/02/01                           History and Physical  DATE OF BIRTH:  09/03/1933  CHIEF COMPLAINT:  Progressively worsening right knee pain for the last year and a half.  HISTORY OF PRESENT ILLNESS:  This 77 year old, white, female patient presented to John L. Rendall III, M.D., with a one and a half year history of gradual onset, but progressively worsening right knee pain.  She does have a history of a fall on her right knee in approximately 2000 or 2001, but no injury since that time.  She has had no surgery on her knee.  At this point, the pain in her knee is described as a constant ache or hurt that becomes sharp at times. It is located diffusely about the joint with some radiation into the proximal tibia.  The pain increases with any walking or if she tries to carry any objects in her arm and the pain decreases with the use of Darvocet.  The knee does feel like it may give way at times and it does swell, but there is no popping, catching, grinding, locking, or paresthesias associated with the pain.  She does have pain that keep her up at night at times and she does use a cane as needed.  She has tried Vioxx, Darvocet, and intra-articular cortisone injections for pain relief and those have all helped her short term.  ALLERGIES:  SULFA causes itching and hives.  CURRENT MEDICATIONS:  1. Ziac 25 mg one tablet p.o. q.d.  2. Levoxyl 0.125 mg one tablet p.o. q.d.  3. Darvocet-N 100 one tablet p.o. q.6h. p.r.n. for pain.  4. Vioxx 25 mg one tablet p.o. q.h.s.  5. Topamax 50 mg two tablets p.o. q.h.s.  6. Premarin 0.625 mg one tablet p.o. q.d.  7. Celexa 20 mg one tablet p.o. q.h.s.  8. ______ Plus one tablet p.o. q.h.s.  9. Zomig 2.5 mg p.o. q.d. p.r.n.  migraine. 10. Claritin 10 mg one tablet p.o. q.h.s. 11. Flonase nasal spray one spray to each naris q.d. 12. Azmacort inhaler two puffs inhaled q.d. p.r.n. asthma attack.  PAST MEDICAL HISTORY:  She was diagnosed with hypertension about six years ago.  She does have hypothyroidism and has had that for about 10 years.  She has had asthma all of her life and that is pretty well controlled with minimal use of her inhaler.  She also has had migraines all of her life and at this point it is controlled with the Topamax and Zomig.  She does have a history of bronchitis in the past and did have cervical cancer, which was treated with a hysterectomy.  Her current medical doctor is Evette Georges, M.D.  She denies any history of diabetes mellitus, hiatal hernia, reflux, peptic ulcer disease, heart disease, or any other chronic medical condition other than noted previously.  PAST SURGICAL HISTORY: 1. Tonsillectomy in 1955. 2. Partial abdominal hysterectomy in 1958. 3. Surgery on her sinuses in 1995. 4. Removal of remaining ovary and fallopian tube in 1965. 5. Excision of a ganglion cyst on both wrists. 6. Excision of a ganglion cyst on her lumbar spine.  SOCIAL HISTORY:  She denies any history of cigarette smoking, alcohol use, or drug use.  She is a widow and does not have any children.  She moved down here to live near her sister.  She currently lives by herself in a two-story townhouse with her master bedroom on the first floor.  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  Her medical doctor is Evette Georges, M.D., at [facility].  FAMILY HISTORY:  Her mother died at the age of 58 with colon cancer, thyroid disease, and coronary artery disease.  Her father died at the age of 24 with a myocardial infarction.  She has one sister who is living at age 22 with allergies.  REVIEW OF SYSTEMS:  She does complain of some intermittent hoarseness due to her allergies.   She does occasionally have some mild low back pain.  She wears a partial plate of dentures on her lower jaw line.  She does wear glasses. She does have a living will, but does not have a power of attorney.  All other systems are negative and noncontributory at this time.  PHYSICAL EXAMINATION:  A well-developed, well-nourished, thin, white female who walks without a limp.  Mood and affect are appropriate.  Accompanied by her sister.  Talks easily with the examiner.  HEIGHT:  5 feet 5-1/2 inches.  WEIGHT:  148 pounds.  BODY MASS INDEX:  23.5.  VITAL SIGNS:  Temperature 97.6 degrees Fahrenheit, pulse 60, respirations 20, BP 156/90.  HEENT:  Normocephalic and atraumatic without frontal or maxillary sinus tenderness to palpation.  Conjunctivae pink.  Sclerae anicteric.  PERRLA. EOMs intact.  The funduscopic exam shows visible red reflex bilaterally with normal retinal vasculature, optic disk, and cup.  No visible external ear deformities.  Hearing grossly intact.  Tympanic membranes pearly gray bilaterally with good light reflex.  Nose and nasal septum midline.  Nasal mucosa pink and moist without exudates or polyps noted.  Buccal mucosa pink and moist.  Good dentition.  Pharynx without erythema or exudates.  Tongue and uvula midline.  Tongue without vesiculations and the uvula rises equally with phonation.  NECK:  No visual masses or lesions noted.  Trachea midline.  No palpable lymphadenopathy.  No thyromegaly.  Carotids +2 bilaterally without bruits. Slightly decreased range of motion with lateral bending, but otherwise full range of motion of the cervical spine with minimal pain with lateral bending to the right.  Nontender to palpation along the cervical spine.  CARDIOVASCULAR:  Heart rate and rhythm regular.  S1 and S2 present without rubs, clicks, or murmurs noted.  RESPIRATORY:  Respirations even and unlabored.  Breath sounds clear to auscultation bilaterally without rales  or wheezes noted.  ABDOMEN:  Rounded abdominal contour.  Bowel sounds present x 4 quadrants.  Soft and nontender to palpation without hepatosplenomegaly nor CVA tenderness. Nontender to palpation along the entire length of the vertebral column. Femoral pulses are +2 bilaterally.  BREASTS:  Exam deferred at this time.  GENITOURINARY:  Exam deferred at this time.  RECTAL:  Exam deferred at this time.  PELVIC:  Exam deferred at this time.  MUSCULOSKELETAL:  No obvious deformities of the bilateral upper extremities with full range of motion of these extremities without pain.  Radial pulses +2 bilaterally.  She has full range of motion of her hips, ankles, and toes bilaterally without pain.  DP and PT pulses are +2.  She has no obvious deformity of her left knee.  She has  full extension and flexion to 140 degrees without crepitus.  She has minimal pain with palpation along the medial joint line of the left knee, but no effusion.  The collateral ligaments are stable. She has full extension and flexion to 120 degrees of the right knee.  There is a moderate amount of crepitus with range of motion of the right knee.  She is tender to palpation on both the medial and lateral joint line with the medial joint line being more tender than the lateral.  There is a +1 effusion noted of the right knee.  Collateral ligaments stable.  NEUROLOGIC:  Alert and oriented x 3.  Cranial nerves II-XII are grossly intact.  Strength 5/5 in bilateral upper and lower extremities.  Rapid alternating movements intact.  Deep tendon reflexes 2+ in bilateral upper and lower extremities.  Sensation intact to light touch.  RADIOLOGIC FINDINGS:  X-rays taken in our office in July of 2002 of the right knee show end-stage osteoarthritis with bone on bone contact in the medial compartment with spurring of the right knee.  IMPRESSION: 1. End-stage osteoarthritis, right knee. 2. Hypertension. 3. Hypothyroidism. 4.  Asthma. 5. Migraines. 6. Allergies. 7. History of cervical cancer.  PLAN:  Ms. Monica will be admitted to [facility] on September 02, 2001, where she will undergo a right total knee arthroplasty by Jonny Ruiz L. Dorothyann Gibbs, M.D.  She will undergo all of the routine preoperative laboratory tests and studies prior to this procedure.  She has been seen by Evette Georges, M.D., preoperatively and has been cleared for surgery. Dictated by:   Arnoldo Morale, P.A. Attending:  Carlisle Beers. Dorothyann Gibbs, M.D. DD:  08/26/01 TD:  08/26/01 Job: [id number]

## 2011-05-17 NOTE — Discharge Summary (Signed)
NAME:  [name] [name], [name] [name]                 ACCOUNT NO.:  [id number]   MEDICAL RECORD NO.:  [id number]          PATIENT TYPE:  INP   LOCATION:  3703                         FACILITY:  [facility]   PHYSICIAN:  Thomas C. Wall, M.D.   DATE OF BIRTH:  December 01, 1933   DATE OF ADMISSION:  03/09/2005  DATE OF DISCHARGE:  03/11/2005                                 DISCHARGE SUMMARY   DISCHARGE DIAGNOSIS:  Chest pain. Negative cardiac enzymes. No objective  evidence of ischemia. EKG repeated without change.   PAST MEDICAL HISTORY:  1.  Exertional chest pain.  2.  Nonobstructive coronary artery disease.  3.  Arthritis.  4.  Status post total right knee replacement in 2002.  5.  Hypothyroidism.  6.  Seasonal allergies.  7.  Migraines.  8.  Hysterectomy.  9.  Status post cyst removal from wrist.  10. Tonsillectomy.  11. Colon polyps, status post colonoscopy.  12. Hypertension.   PROCEDURE DONE THIS ADMISSION:  None.   DISCHARGE MEDICATIONS:  As previously taken by patient including Zyrtec,  Levoxyl 125 mg daily, Mavik one daily, atenolol 50 mg daily, chlorthalidone  25 mg daily, Topamax 50 mg daily, Lipitor 20 mg daily, Premarin 0.03 mg  daily.   DISCHARGE INSTRUCTIONS:  The patient is instructed to continue medications  as previously taken, holding beta blocker this morning. Pain management will  be Tylenol for general discomfort and nitroglycerin for chest discomfort.   ACTIVITY:  As tolerated.   DIET:  As previously instructed, however nothing to eat or drink this a.m.  prior to stress test this afternoon at our office.   SPECIAL INSTRUCTIONS:  She is to arrive in our office at 1 p.m. for a gated  stress test.   FOLLOWUP:  I have scheduled her for a CMET to be drawn Wednesday, March 15th  in our office and a follow-up appointment with Dr. Daleen Squibb on March 22nd at 9  a.m.   HOSPITAL COURSE:  This 77 year old female with history of as stated above  presented to [facility] emergency room after  sudden onset of what she  described as a crushing substernal chest pain like someone sitting on her  chest, radiating through her chest and into her back. Her arms felt heavy.  She had a mild headache, shortness of breath with it. Her chest pain  resolved en route to the hospital; however, the patient was admitted to rule  out MI. Cardiac enzymes were cycled. She was placed on heparin drip.  Medications continued, including statins, aspirin, beta blockers. EKG  without change. Point of care enzymes negative. The patient able to ambulate  in hall without further chest pain. Dr. Rollene Rotunda was in to see the  patient on the morning of discharge. Plan is to discharge the patient home  and follow up in our office for outpatient gated stress test that has been  scheduled and follow up with Dr. Daleen Squibb as instructed.      MB/MEDQ  D:  03/11/2005  T:  03/11/2005  Job:  [id number]

## 2011-05-17 NOTE — Assessment & Plan Note (Signed)
[facility]                              CARDIOLOGY OFFICE NOTE   OLINDA, NOLA                        MRN:          [id number]  DATE:07/10/2006                            DOB:          1933/04/03    [name] [name] returns today for further management of her nonobstructive coronary  artery disease.  She has had one episode of chest discomfort that woke her  at about 3 a.m. in the morning.  It was in the center of her sternum.  She  felt some discomfort in her left neck.   She does not have exertional chest discomfort or any other symptoms  consistent with ischemia.  She does have a history of gastroesophageal  reflux and apparently has a remote ulcer.   She had a stress Myoview a year ago which was negative for ischemia with EF  of 70%.   Her medications are outlined on the maintenance medication list.  They are  all the same from last year except she is now on atenolol/chlorthalidone at  50/25 daily for her blood pressure.  She is also taking some fish oil and  black cherry juice as well as Mobic 7 mg a day.   Her blood pressure today is 109/66, her pulse is 58 and regular.  EKG is  normal with sinus brady.  Weight is 182.  She is in no acute distress.  Carotids are full without bruits.  There is no JVD.  Thyroid is not  enlarged.  Trachea is midline.  Lungs are clear.  Heart reveals a regular  rate and rhythm, soft S1 S2.  Abdominal exam is soft with good bowel sounds.  Extremities without edema.  Pulse are intact.   Mrs. Remund is having noncardiac precordial pain.  It sounds mostly like  reflux.  She is overdue a visit with Dr. Barnet Pall which she promises to  get an appointment soon.  I have placed her on a proton pump inhibitor of  choice for a month.  No refills.   I have asked her to continue her current medications.  We will see her back  again in a year.                               Thomas C. Daleen Squibb, MD, [facility]    TCW/MedQ  DD:   07/10/2006  DT:  07/10/2006  Job #:  [id number]   cc:   Tinnie Gens A. Tawanna Cooler, MD  Barbette Hair. Arlyce Dice, MD, [facility]

## 2011-05-17 NOTE — Assessment & Plan Note (Signed)
[place] [facility]                           GASTROENTEROLOGY OFFICE NOTE   NAME:Ben, [name] [name]                        MRN:          [id number]  DATE:10/20/2006                            DOB:          [birth year]    Ms. Sen has returned still complaining of upper abdominal pain.  She  describes a burning chest discomfort, often postprandial.  She has  intermittent nausea with vomiting.  She is slightly improved with Nexium,  though symptoms remain.  She denies dysphagia or odynophagia.  She was  evaluated by Dr. Daleen Squibb in July who felt this was not cardiac pain.  When last  seen by GI she was complaining of lower abdominal pain only.   She does take Mobic daily.  This is in addition to her Nexium.  Other  medications include Levoxyl, Topamax, Mavik, Premarin, citalopram, atenolol,  and Lipitor.   She is allergic to CIPRO and SULFA.   PHYSICAL EXAMINATION:  VITAL SIGNS:  Pulse 80, blood pressure 132/60, weight  178.  HEENT:  EOMI. PERRLA. Sclerae are anicteric.  Conjunctivae are pink.  NECK:  Supple without thyromegaly, adenopathy or carotid bruits.  CHEST:  Clear to auscultation and percussion without adventitious sounds.  CARDIAC:  Regular rhythm; normal S1 S2.  There are no murmurs, gallops or  rubs.  ABDOMEN:  She has mild mid epigastric tenderness without guarding or  rebound.  There are no abdominal masses or organomegaly.  Bowel sounds are  normoactive.  EXTREMITIES:  Full range of motion.  No cyanosis, clubbing or edema.  RECTAL:  Deferred.   IMPRESSION:  Persistent upper abdominal pain with pyrosis.  I suspect  symptoms are acid-related.  She could have active peptic disease.   RECOMMENDATIONS:  1. Continue Nexium.  2. Upper endoscopy.       Barbette Hair. Arlyce Dice, MD,FACG    RDK/MedQ  DD:  10/20/2006  DT:  10/20/2006  Job #:  [id number]   cc:   Tinnie Gens A. Tawanna Cooler, MD  Jesse Sans. Wall, MD, [facility]

## 2011-05-17 NOTE — H&P (Signed)
NAME:  [name] [name], [name] [name]                 ACCOUNT NO.:  [id number]   MEDICAL RECORD NO.:  [id number]          PATIENT TYPE:  EMS   LOCATION:  MAJO                         FACILITY:  [facility]   PHYSICIAN:  Larry A. Freida Busman, M.D. [facility] OF BIRTH:  Feb 09, 1933   DATE OF ADMISSION:  03/09/2005  DATE OF DISCHARGE:                                HISTORY & PHYSICAL   PHYSICIANS:  Cardiologist:  Jesse Sans. Wall, M.D.  Primary care physician:  Eugenio Hoes. Tawanna Cooler, M.D.   CHIEF COMPLAINT:  Substernal chest pressure.   HISTORY OF PRESENT ILLNESS:  The patient is a 77 year old female with  minimal cardiac risk factors including a history of hypertension and family  history for CAD, but no known diabetes and no history of tobacco.  She had  some chest discomfort back in 2004 when she started walking a new dog that  she got.  She was eventually referred to Dr. Daleen Squibb.  She underwent stress  testing which was inadequate. She then underwent cardiac catheterization  which showed nonobstructive coronary artery disease, but she did have 50 to  60% stenoses in the mid LAD as well as significant diagonal #1.  Mavik was  added to her regimen as well as a statin, and she was already on atenolol  for hypertension.  Since that time, she has had some intermittent exertional  chest pain but has done relatively well.  Given all of those findings, Dr.  Daleen Squibb had planned to repeat a stress test in April 2006.   The patient was relatively stable until January 2006.  When she was home  alone one evening, she had 15 minutes of chest pain which was fairly severe  but then resolved on its own.  She did not seek medical attention at that  time.  This past week, she had some mild medical issues.  She had a rash on  her chin and saw her allergist who discontinued her NSAIDs as well as  hydrochlorothiazide.  She started prednisone she thinks 20 mg b.i.d.  She  also had some chills and mildly increased fatigue.  However, her thyroid  medications had been decreased in December due to a mildly depressed TSH.  Yesterday she also had some mild shortness of breath, worsening fatigue, and  so she stopped prednisone after four days.   Today at 3 p.m. while doing some light housework, she had sudden onset of  what she described as crushing substernal chest pressure like someone  sitting on her chest.  It radiate throughout her chest and into her back.  Her arms also felt heavy.  She had a mild headache and shortness of breath.  There was no nausea or vomiting and no diaphoresis.  This resolved on its  own after about 15 minutes.  She had a second episode which occurred at  about 7 p.m. and was also severe in nature and lasted about 45 minutes.  Her  husband drove her to the emergency department, and her chest pain resolved  en route.   In the ED, EKG was normal sinus rhythm without significant changes.  Initial  cardiac markers were negative.  Given her history, she was admitted to  [facility].   PAST MEDICAL HISTORY:  1.  Exertional chest pain.  2.  Nonobstructive coronary artery disease.  3.  Arthritis, status post right total knee replacement in 2002.  4.  Hypothyroidism.  5.  Seasonal allergies, followed by allergist, Dr. Corinda Gubler.  6.  Migraines.  7.  Hysterectomy for fibroids.  8.  Status post cyst removal from wrist.  9.  Tonsillectomy.  10. Colon polyps on colonoscopy.  11. Hypertension.   ALLERGIES:  1.  SULFA.  2.  Question HYDROCHLOROTHIAZIDE causing rash.   CURRENT MEDICATIONS:  1.  Zyrtec 10 mg daily.  2.  Levoxyl 125 mcg daily.  3.  Mavik 1 mg daily.  4.  Atenolol 50 mg daily.  5.  Chlorthalidone 25 mg daily.  6.  Topamax 50 mg daily.  7.  Lipitor 20 mg daily.  8.  Premarin 0.03 mg daily.  9.  The patient recently discontinued hydrochlorothiazide 25 mg and also      discontinued Mobic 7 mg.  10. She was on prednisone the last 4 days.   SOCIAL HISTORY:  The patient lives in  [place] for the last three years  with her husband.  She previously worked Special educational needs teacher. She has never  smoked and never used alcohol significantly.  She does not use drugs.  She  does not exercise regularly.  She uses green tea but no other herbal  supplements.   FAMILY HISTORY:  The patient's mother died of sudden cardiac death and  myocardial infarction at age 52.  Father died of colon cancer at age 15, but  did have a myocardial infarction in his late 10s.  She has one sister who is  alive and well.  Her maternal aunts and uncles all had CAD and died in their  42s to 2s.   REVIEW OF SYSTEMS:  Positive for chills, a 20-pound weight gain over the  last year, fatigue, headache, rash, chest pain, shortness of breath,  abdominal distention, arthralgias.  Otherwise negative for fevers, edema,  palpitations, syncope, weakness, nausea, melena, or other symptoms.  All  other systems negative in detail.   PHYSICAL EXAMINATION:  VITAL SIGNS:  Temperature 97.4, pulse 52, blood  pressure 140/60, respirations 19, O2 saturation 97% on room air.  GENERAL:  The patient was in no apparent distress, conversant.  She is not  having active chest pain.  She has very mild redness on her neck but no  evidence of frank rash.  She has had extensive dental work.  NECK:  JVP was visible and mildly elevated at 7 cm.  No thyromegaly.  HEART:  Regular rate and rhythm with normal S1 and S2.  A 2/6 soft murmur at  the right upper sternal border.  LUNGS: Clear to auscultation bilaterally.  ABDOMEN:  Mildly obese, however, soft and nontender.  No hepatomegaly.  RECTAL:  Deferred.  EXTREMITIES:  Trace edema with 2+ right femoral pulse and no bruit.  NEUROLOGIC:  Intact.   LABORATORY DATA AND OTHER STUDIES:  Chest x-ray not available.   EKG:  Normal sinus rhythm with normal axis, normal intervals, and no ST-T  changes.  White blood cell count 10, hematocrit 40, platelets 341. CK-MB less than 1,  troponin  I not detectable.  Remaining labs pending.   Catheterization July 19, 2003, showed LV EDP of 11 with normal left  ventricular function on left ventriculogram.  Left  main was normal.  Left  circumflex was normal.  RCA and PDA were normal.  LAD had a 30% mid and 50  to 60% distal lesion with a diagonal #1 with a 50% ostial lesion.   IMPRESSION:  A 77 year old female with history of nonobstructive coronary  artery disease who presents with multiple somatic complaints over the last  week and now presents with concerning substernal chest pressure episodes x 2  at rest.   PLAN:  1.  Rule out myocardial infarction.  2.  Telemetry.  3.  Aspirin and heparin.  4.  A statin, ACE inhibitor, and beta blocker.  5.  Hypertension control.  6.  Nitroglycerin as needed.  7.  If workup is negative, would likely proceed to adenosine Cardiolite on      Monday.  However, I think it would be prudent to review her old      catheterization films and get a better feel for her previous LAD      disease.  Given her overall symptoms, it would not be unreasonable to      take her directly to cardiac catheterization.      LAA/MEDQ  D:  03/09/2005  T:  03/09/2005  Job:  [id number]   cc:   Thomas C. Wall, M.D.   Jeffrey A. Tawanna Cooler, M.D. [facility]

## 2011-05-17 NOTE — Op Note (Signed)
[place]  Patient:    [name] [name], [name] [name] Visit Number: [id number] MRN: [id number]          Service Type: SUR Location: [id number] Attending Physician:  Carolan Shiver Ii Proc. Date: 09/02/01 Admit Date:  09/02/2001                             Operative Report  PREOPERATIVE DIAGNOSIS:  Osteoarthritis, right knee.  PROCEDURE:  Right LCS total knee replacement.  POSTOPERATIVE DIAGNOSIS:  Osteoarthritis, right knee.  SURGEON:  John L. Dorothyann Gibbs, M.D.  ASSISTANT:  Arnoldo Morale, P.A.  ANESTHESIA:  General anesthesia.  PATHOLOGY:  The patient has bone-on-bone medial compartment right knee and patellofemoral osteoarthritis as well.  She has unremitting pain nonresponsive to conservative measures.  JUSTIFICATION FOR PROCEDURE:  Unremitting pain.  DESCRIPTION OF PROCEDURE:  Under general anesthesia, the right leg was prepped with Duraprep and draped as a sterile field.  A 16 cm incision is made from the tibial tubercles vertically pretty much midline.  Dissection is carried medial parapatella.  The patella is everted.  The femur is sized at a standard. Proximal tibial resection is carried out using the tibial guide. Using the first femoral guide, an interchondylar drillhole is placed and using the second guide, the anterior and posterior flare of the distal femur are resected with a 15 mm flexion gap.  Using the intermedullary guide, a distal femoral cut is made with a 15 mm extension gap.  Recessing guide is then used. Lamina spreader is inserted and remnants of the cruciates and menisci are resected.  Following this, the tibia is sized at a 3, center peg-hole is placed.  Trial reduction of a #3 tibia 15 bearing and standard femur reveals slight laxity of the bearing and 17.5 gave a much better fit.  Patella is osteotomized and three peg-holes placed.  Bony surfaces are then cleaned with pulse irrigation and permanent components are  cemented in place.  That was a #3 tibia, 17.5 rotating bearing, and standard femur and standard patella. Following cement hardening, all excess is removed.  Tourniquet is let down at 53 minutes.  The multiple small vessels are cauterized.  Bleeding is not from any one vessel, but from numerous small vessels and it was felt appropriate to insert a medium hemovac drain.  The knee was then closed in layers with #1 Surgidek, 0 and 2-0 Vicryl, and skin clips.  Operative time approximately an hour and 10 minutes.  The patient tolerated the procedure well and received a femoral block before return to the recovery room. Attending Physician:  Carolan Shiver Ii DD:  09/02/01 TD:  09/02/01 Job: [id number]

## 2011-05-17 NOTE — H&P (Signed)
NAME:  [name] [name], [name] [name]                 ACCOUNT NO.:  [id number]   MEDICAL RECORD NO.:  [id number]          PATIENT TYPE:  AMB   LOCATION:  SDC                           FACILITY:  [facility]   PHYSICIAN:  Zenaida Niece, M.D.DATE OF BIRTH:  January 26, 1933   DATE OF ADMISSION:  10/08/2005  DATE OF DISCHARGE:                                HISTORY & PHYSICAL   CHIEF COMPLAINT:  Abdominal/pelvic pain and possible retained ovaries.   HISTORY OF PRESENT ILLNESS:  This is a 77 year old white female gravida 0  who was referred to me on August 29, 2005. She complains of right lower  quadrant pain and pulling and this has been going on for approximately 2  years. She has constant discomfort with occasional exacerbations. The  exacerbations are unpredictable. She does have occasional discomfort with  bowel movements and has had a normal colonoscopy. She has normal urinary  function with occasional urgency. She did have a CT scan performed by  [place] which did reveal possible ovarian cyst. However she had a pelvic  ultrasound performed here in the office which revealed bilateral small  adnexal cysts which could possibly be of ovarian origin. Due to her pain and  the possible persistence of ovaries, she wishes to undergo surgical removal.   PAST OB HISTORY:  She is nulligravid.   PAST MEDICAL HISTORY:  1.  Hypothyroidism.  2.  Hypertension.  3.  Hypercholesterolemia.  4.  Depression.  5.  Migraine headaches.  6.  Arthritis.  7.  She has had a blood transfusion.   PAST SURGICAL HISTORY:  1.  In 1958 she had a hysterectomy and unilateral salpingo-oophorectomy for      abnormal bleeding and myomas.  2.  In 1965 she is under the impression she had the other tube and ovary      removed.  3.  In 1987 she had sinus surgery.  4.  In 2002 she had a knee replaced.   ALLERGIES:  SULFA AND POSSIBLY TO PREDNISONE.   CURRENT MEDICATIONS:  Premarin 0.3 mg. She has recently been on Sulindac  which she  changed from Mobic for her arthritis as well as Geographical information systems officer for  hemorrhoids. She was instructed to stop the Sulindac 1 week prior to  surgery.   FAMILY HISTORY:  Noncontributory.   REVIEW OF SYSTEMS:  Is otherwise noted.   SOCIAL HISTORY:  She denies alcohol, tobacco or drug use.   PHYSICAL EXAMINATION:  VITAL SIGNS: Weight is 182 pounds. Blood pressure is  110/62, pulse is 80.  GENERAL:  This is a well-developed female in no acute distress.  NECK:  Supple without lymphadenopathy or thyromegaly.  LUNGS:  Clear to auscultation.  HEART:  Regular rate and rhythm without murmur.  ABDOMEN:  Soft, nondistended with mild tenderness in bilateral upper  quadrants without palpable masses. She does have a right paramedian scar.  EXTREMITIES:  No edema and nontender.  PELVIC EXAM:  External genitalia has no lesions. On speculum exam she has a  normal well-healed vaginal cuff. On bimanual exam there are no masses and  she is  nontender.   ASSESSMENT:  Abdominal/pelvic pain with possible ovarian remnants. All  options have been discussed with patient and she wishes to undergo surgical  evaluation. Risks of surgery including bleeding, infection and damage to  surrounding organs have been discussed with the patient. She understands  these risks.   PLAN:  Admit the patient on the day of surgery for an open laparoscopy with  possible bilateral salpingo-oophorectomy and evaluation of the pelvis for  these possible ovarian remnants.      Zenaida Niece, M.D.  Electronically Signed     TDM/MEDQ  D:  10/07/2005  T:  10/07/2005  Job:  [id number]

## 2011-05-29 ENCOUNTER — Ambulatory Visit (INDEPENDENT_AMBULATORY_CARE_PROVIDER_SITE_OTHER): Payer: Medicare Other | Admitting: Cardiology

## 2011-05-29 ENCOUNTER — Encounter: Payer: Self-pay | Admitting: Cardiology

## 2011-05-29 VITALS — BP 122/70 | HR 62 | Ht 65.0 in | Wt 178.0 lb

## 2011-05-29 DIAGNOSIS — I2510 Atherosclerotic heart disease of native coronary artery without angina pectoris: Secondary | ICD-10-CM

## 2011-05-29 MED ORDER — ASPIRIN 81 MG PO TBEC: 81.0000 mg | DELAYED_RELEASE_TABLET | Freq: Every day | ORAL | Status: DC

## 2011-05-29 NOTE — Patient Instructions (Signed)
Your physician has recommended you make the following change in your medication:  Restart your aspirin 81mg  daily Your physician recommends that you schedule a follow-up appointment in: 1 year with Dr. Daleen Squibb

## 2011-05-29 NOTE — Progress Notes (Signed)
HPI  [name] [name] returns post catheterization for followup. She did not tolerate isosorbide because of headaches. She is no longer taking. She'll stop her aspirin which we have started.  Her cardiac catheterization showed nonobstructive disease. I have reviewed her anatomy with her today. Her LDL is not a goal yet her HDL is 60.   She's having no further chest discomfort.  EKG today shows normal sinus rhythm no changes.  Past Medical History  Diagnosis Date  . Coronary artery disease   . Hyperlipidemia   . Orthostatic hypotension   . Hypertension   . Osteoarthrosis, unspecified whether generalized or localized, unspecified site   . Family history of malignant neoplasm of gastrointestinal tract   . Allergic rhinitis, cause unspecified   . Unspecified functional disorder of intestine   . H/O: hysterectomy   . Osteoporosis   . Anxiety and depression   . GERD (gastroesophageal reflux disease)   . Hypothyroidism   . Chronic headaches     Past Surgical History  Procedure Date  . Cardiac catheterization   . Total knee arthroplasty   . Nasal sinus surgery   . Tonsillectomy   . Appendectomy   . Cyst removal on back     Family History  Problem Relation Age of Onset  . Cancer Other     brain, colon 1st relative < 60  . Heart disease Other   . Colon cancer Mother   . Heart attack Father 42    History   Social History  . Marital Status: Married    Spouse Name: N/A    Number of Children: N/A  . Years of Education: N/A   Occupational History  . retired    Social History Main Topics  . Smoking status: Never Smoker   . Smokeless tobacco: Not on file   Comment: was a passive smoker for 15 yrs  . Alcohol Use: No  . Drug Use: Not on file  . Sexually Active: Not on file   Other Topics Concern  . Not on file   Social History Narrative  . No narrative on file    Allergies  Allergen Reactions  . Aspirin Other (See Comments)  . Prednisone   . Sulfonamide Derivatives       Current Outpatient Prescriptions  Medication Sig Dispense Refill  . acetaminophen (TYLENOL) 325 MG tablet Take one half tablet by mouth as needed       . alendronate (FOSAMAX) 70 MG tablet Take 70 mg by mouth every 7 (seven) days. Take with a full glass of water on an empty stomach.       Marland Kitchen atenolol (TENORMIN) 50 MG tablet Take 50 mg by mouth daily.        Marland Kitchen atorvastatin (LIPITOR) 20 MG tablet Take 20 mg by mouth daily.        . celecoxib (CELEBREX) 200 MG capsule Take 200 mg by mouth daily.        . DULoxetine (CYMBALTA) 30 MG capsule Take 30 mg by mouth daily.        . ergocalciferol (VITAMIN D2) 50000 UNITS capsule Take 50,000 Units by mouth once a week.        . esomeprazole (NEXIUM) 40 MG capsule Take 40 mg by mouth daily.        Marland Kitchen estrogens, conjugated, (PREMARIN) 0.3 MG tablet Take 0.3 mg by mouth daily.        Marland Kitchen levothyroxine (SYNTHROID, LEVOTHROID) 112 MCG tablet Take 112 mcg by mouth daily.        Marland Kitchen  nitroGLYCERIN (NITROSTAT) 0.4 MG SL tablet Place 1 tablet (0.4 mg total) under the tongue every 5 (five) minutes as needed for chest pain.  90 tablet  12  . NON FORMULARY Allergy shot weekly, allergic to grass, some trees, dust mites       . topiramate (TOPAMAX) 100 MG tablet Take 100 mg by mouth daily.        Marland Kitchen DISCONTD: aspirin EC 81 MG EC tablet Take 1 tablet (81 mg total) by mouth daily.  150 tablet  2  . DISCONTD: isosorbide mononitrate (IMDUR) 60 MG 24 hr tablet Take 1 tablet (60 mg total) by mouth every morning.  30 tablet  11    ROS Negative other than HPI.   PE  General Appearance: well developed, well nourished in no acute distress, obese HEENT: symmetrical face, PERRLA, good dentition  Neck: no JVD, thyromegaly, or adenopathy, trachea midline Chest: symmetric without deformity Cardiac: PMI non-displaced, RRR, normal S1, S2, no gallop or murmur Lung: clear to ausculation and percussion Vascular: all pulses full without bruits  Abdominal: nondistended, nontender, good  bowel sounds, no HSM, no bruits Extremities: no cyanosis, clubbing or edema, no sign of DVT, no varicosities  Skin: normal color, no rashes Neuro: alert and oriented x 3, non-focal Pysch: normal affect  Filed Vitals:   05/29/11 1438  BP: 122/70  Pulse: 62  Height: 5\' 5"  (1.651 m)  Weight: 178 lb (80.74 kg)    EKG  Labs and Studies Reviewed.   Lab Results  Component Value Date   WBC 6.8 05/02/2011   HGB 12.3 05/02/2011   HCT 36.3 05/02/2011   MCV 89.6 05/02/2011   PLT 254.0 05/02/2011      Chemistry      Component Value Date/Time   NA 139 05/02/2011 1640   K 4.3 05/02/2011 1640   CL 107 05/02/2011 1640   CO2 25 05/02/2011 1640   BUN 16 05/02/2011 1640   CREATININE 1.1 05/02/2011 1640      Component Value Date/Time   CALCIUM 8.6 05/02/2011 1640   ALKPHOS 79 12/26/2006 1227   AST 18 12/26/2006 1227   ALT 12 12/26/2006 1227       Lab Results  Component Value Date   CHOL 196 12/26/2006   Lab Results  Component Value Date   HDL 60.3 12/26/2006   Lab Results  Component Value Date   LDLCALC 110* 12/26/2006   No results found for this basename: TRIG   No results found for this basename: CHOLHDL   No results found for this basename: HGBA1C   Lab Results  Component Value Date   ALT 12 12/26/2006   AST 18 12/26/2006   ALKPHOS 79 12/26/2006   Lab Results  Component Value Date   TSH 2.31 12/26/2006  I

## 2011-05-29 NOTE — Assessment & Plan Note (Signed)
Stable. I have asked her to restart her aspirin. Followup in one year.

## 2011-07-30 ENCOUNTER — Encounter: Payer: Self-pay | Admitting: Gastroenterology

## 2011-07-30 ENCOUNTER — Ambulatory Visit (INDEPENDENT_AMBULATORY_CARE_PROVIDER_SITE_OTHER): Payer: Medicare Other | Admitting: Gastroenterology

## 2011-07-30 DIAGNOSIS — Z800 Family history of malignant neoplasm of digestive organs: Secondary | ICD-10-CM

## 2011-07-30 DIAGNOSIS — K648 Other hemorrhoids: Secondary | ICD-10-CM

## 2011-07-30 HISTORY — DX: Other hemorrhoids: K64.8

## 2011-07-30 MED ORDER — HYDROCORTISONE 2.5 % RE CREA: TOPICAL_CREAM | Freq: Two times a day (BID) | RECTAL | Status: AC

## 2011-07-30 NOTE — Assessment & Plan Note (Signed)
Her rectal complaints are likely secondary to hemorrhoid and exacerbated by constipation.  Recommendations #1 fiber supplementation daily #2 Anusol HC suppositories

## 2011-07-30 NOTE — Patient Instructions (Signed)
Please schedule an return office appointment for 1 month

## 2011-07-30 NOTE — Progress Notes (Signed)
History of Present Illness: [name] [name] has returned for evaluation of rectal bleeding and constipation. Last colonoscopy in January 2011 demonstrated diverticulosis.  She tends to have small caliber stools and feels constipated. She has problems with staining her underclothes and sees small amounts of blood at times with her bowel movement. She also leaks small amount of fluid.  Family history pertinent for colon cancer in her mother.    Review of Systems: Pertinent positive and negative review of systems were noted in the above HPI section. All other review of systems were otherwise negative.    Current Medications, Allergies, Past Medical History, Past Surgical History, Family History and Social History were reviewed in [employer] [facility] electronic medical record  Vital signs were reviewed in today's medical record. Physical Exam: General: Well developed , well nourished, no acute distress Head: Normocephalic and atraumatic Eyes:  sclerae anicteric, EOMI Ears: Normal auditory acuity Mouth: No deformity or lesions Lungs: Clear throughout to auscultation Heart: Regular rate and rhythm; no murmurs, rubs or bruits Abdomen: Soft, non tender and non distended. No masses, hepatosplenomegaly or hernias noted. Normal Bowel sounds Rectal: There is one grade 4 hemorrhoid Musculoskeletal: Symmetrical with no gross deformities  Pulses:  Normal pulses noted Extremities: No clubbing, cyanosis, edema or deformities noted Neurological: Alert oriented x 4, grossly nonfocal Psychological:  Alert and cooperative. Normal mood and affect

## 2011-08-30 ENCOUNTER — Other Ambulatory Visit: Payer: Self-pay | Admitting: Family Medicine

## 2011-08-30 DIAGNOSIS — IMO0002 Reserved for concepts with insufficient information to code with codable children: Secondary | ICD-10-CM

## 2011-08-30 DIAGNOSIS — M5412 Radiculopathy, cervical region: Secondary | ICD-10-CM

## 2011-09-06 ENCOUNTER — Ambulatory Visit
Admission: RE | Admit: 2011-09-06 | Discharge: 2011-09-06 | Disposition: A | Discharge status: Home / Self Care | Payer: Medicare Other | Source: Ambulatory Visit | Attending: Family Medicine | Admitting: Family Medicine

## 2011-09-06 DIAGNOSIS — IMO0002 Reserved for concepts with insufficient information to code with codable children: Secondary | ICD-10-CM

## 2011-09-06 DIAGNOSIS — M5412 Radiculopathy, cervical region: Secondary | ICD-10-CM

## 2011-10-02 ENCOUNTER — Other Ambulatory Visit (HOSPITAL_COMMUNITY): Payer: Self-pay | Admitting: Neurosurgery

## 2011-10-02 ENCOUNTER — Encounter (HOSPITAL_COMMUNITY)
Admission: RE | Admit: 2011-10-02 | Discharge: 2011-10-02 | Disposition: A | Discharge status: Home / Self Care | Payer: Medicare Other | Source: Ambulatory Visit | Attending: Neurosurgery | Admitting: Neurosurgery

## 2011-10-02 DIAGNOSIS — M4802 Spinal stenosis, cervical region: Secondary | ICD-10-CM

## 2011-10-02 LAB — DIFFERENTIAL
Basophils Absolute: 0.1 10*3/uL (ref 0.0–0.1)
Eosinophils Relative: 2 % (ref 0–5)
Lymphocytes Relative: 25 % (ref 12–46)
Lymphs Abs: 2.1 10*3/uL (ref 0.7–4.0)
Monocytes Absolute: 0.7 10*3/uL (ref 0.1–1.0)
Monocytes Relative: 8 % (ref 3–12)

## 2011-10-02 LAB — CBC
HCT: 38.6 % (ref 36.0–46.0)
Hemoglobin: 12.7 g/dL (ref 12.0–15.0)
MCH: 28.5 pg (ref 26.0–34.0)
MCHC: 32.9 g/dL (ref 30.0–36.0)
MCV: 86.5 fL (ref 78.0–100.0)
RDW: 14.1 % (ref 11.5–15.5)

## 2011-10-02 LAB — BASIC METABOLIC PANEL
BUN: 18 mg/dL (ref 6–23)
Calcium: 9.5 mg/dL (ref 8.4–10.5)
Creatinine, Ser: 1.01 mg/dL (ref 0.50–1.10)
GFR calc Af Amer: 61 mL/min — ABNORMAL LOW (ref >90)
GFR calc non Af Amer: 53 mL/min — ABNORMAL LOW (ref >90)
Glucose, Bld: 85 mg/dL (ref 70–99)

## 2011-10-02 LAB — ABO/RH: ABO/RH(D): A NEG

## 2011-10-04 ENCOUNTER — Observation Stay (HOSPITAL_COMMUNITY): Payer: Medicare Other

## 2011-10-04 ENCOUNTER — Inpatient Hospital Stay (HOSPITAL_COMMUNITY)
Admission: RE | Admit: 2011-10-04 | Discharge: 2011-10-05 | DRG: 473 | Disposition: A | Discharge status: Home / Self Care | Payer: Medicare Other | Source: Ambulatory Visit | Attending: Neurosurgery | Admitting: Neurosurgery

## 2011-10-04 DIAGNOSIS — I10 Essential (primary) hypertension: Secondary | ICD-10-CM | POA: Diagnosis present

## 2011-10-04 DIAGNOSIS — Z79899 Other long term (current) drug therapy: Secondary | ICD-10-CM

## 2011-10-04 DIAGNOSIS — Z01818 Encounter for other preprocedural examination: Secondary | ICD-10-CM

## 2011-10-04 DIAGNOSIS — K219 Gastro-esophageal reflux disease without esophagitis: Secondary | ICD-10-CM | POA: Diagnosis present

## 2011-10-04 DIAGNOSIS — Z23 Encounter for immunization: Secondary | ICD-10-CM

## 2011-10-04 DIAGNOSIS — M4712 Other spondylosis with myelopathy, cervical region: Principal | ICD-10-CM | POA: Diagnosis present

## 2011-10-04 DIAGNOSIS — Z882 Allergy status to sulfonamides status: Secondary | ICD-10-CM

## 2011-10-04 DIAGNOSIS — I2510 Atherosclerotic heart disease of native coronary artery without angina pectoris: Secondary | ICD-10-CM | POA: Diagnosis present

## 2011-10-04 DIAGNOSIS — Z01812 Encounter for preprocedural laboratory examination: Secondary | ICD-10-CM

## 2011-10-15 NOTE — Op Note (Signed)
NAMEKYOMI, HECTOR                 ACCOUNT NO.:  [id number]  MEDICAL RECORD NO.:  [id number]  LOCATION:  3524                         FACILITY:  [facility]  PHYSICIAN:  Sherilyn Cooter A. Aeisha Minarik, M.D.    DATE OF BIRTH:  June 22, 1933  DATE OF PROCEDURE:  10/04/2011 DATE OF DISCHARGE:                              OPERATIVE REPORT   PREOPERATIVE DIAGNOSIS:  C5-C6 stenosis with myelopathy.  POSTOPERATIVE DIAGNOSIS:  C5-C6 stenosis with myelopathy.  PROCEDURE NOTE:  C5-C6 anterior cervical diskectomy and fusion with allograft and plating.  SURGEON:  Kathaleen Maser. Kariann Wecker, MD  ASSISTANT:  None.  ANESTHESIA:  General.  IDENTIFICATION:  Ms. Virgo is a 77 year old female with history of neck and bilateral shoulder symptoms with some intermittent numbness and paresthesias extending to the upper extremities.  Workup demonstrates evidence of marked cervical spondylosis with severe stenosis at C5-C6 and spinal cord compression.  The patient presents now for C5-C6 anterior cervical diskectomy and fusion with allograft and anterior plating.  OPERATIVE DETAILS:  The patient was brought the operating room and placed on the operative table in supine position.  After adequate level of anesthesia was achieved, the patient was placed supine, neck slightly extended, and held in place in halter traction.  The patient's anterior cervical region was prepped and draped sterilely.  A #10 blade was used to make a curvilinear skin incision overlying the C5-C6 interspace. This was carried down sharply to the platysma.  The platysma then divided vertically and dissection proceeded along the medial border of the sternocleidomastoid muscle and carotid sheath.  Trachea and esophagus are mobilized, tracked towards the left.  Prevertebral fascia stripped off the anterior spinal column.  Longus colli muscles elevated bilaterally using electrocautery.  Deep self-retaining retractor was placed.  Intraoperative fluoroscopy was used and  levels were confirmed. Disk space at C5-C6 was then incised with #15 blade in a rectangular fashion.  A wide disk space clean-out was then achieved using pituitary rongeurs, forward and backward angled Karlin curettes, Kerrison rongeurs, high-speed drill.  Microscope was then brought into the field and used throughout the remainder of the diskectomy and remaining osteophytes were removed using high-speed drill down to the level of the posterior longitudinal ligament.  The posterior longitudinal ligament was then elevated and resected in a piecemeal fashion using Kerrison rongeurs.  The underlying thecal sac was then identified.  Wide central decompression was then performed by undercutting the bodies of C5 and C6.  Decompression was then proceeded out to the edges of the neural foramen.  Wide anterior foraminotomies were then performed along the course of the C6 nerve roots bilaterally.  At this point, a very thorough decompression was achieved.  There was no evidence of injury to thecal sac or nerve roots.  Wound was then irrigated with antibiotic solution.  Gelfoam was placed topically.  Hemostasis was found to be good.  A 7-mm Cornerstone allograft wedge was then impacted into place, recessed approximately 1 mm from the anterior cortical margin.  A 25-mm Atlantis anterior cervical plate was then placed over the C5-C6 levels. This was then attached under fluoroscopic guidance using 13-mm variable angle screws, two each at both levels.  All four  screws were given final tightening and found to be solid within the bone.  The locking screws were engaged at both levels.  Final images revealed good position of the bone grafts, hardware at proper operative level with normal alignment of spine.  The wound was then irrigated with antibiotic solution. Hemostasis was ensured with bipolar electrocautery.  Wound was then closed in typical fashion.  Steri-Strips and sterile dressings were applied.   There were no complications.  The patient tolerated the procedure well, and she returns to the recovery room postoperatively.          ______________________________ Kathaleen Maser Keshon Markovitz, M.D.     HAP/MEDQ  D:  10/04/2011  T:  10/04/2011  Job:  [id number]  Electronically Signed by Julio Sicks M.D. on 10/15/2011 11:36:46 AM

## 2011-12-04 ENCOUNTER — Other Ambulatory Visit: Payer: Self-pay | Admitting: Neurosurgery

## 2011-12-04 ENCOUNTER — Ambulatory Visit
Admission: RE | Admit: 2011-12-04 | Discharge: 2011-12-04 | Disposition: A | Discharge status: Home / Self Care | Payer: Medicare Other | Source: Ambulatory Visit | Attending: Neurosurgery | Admitting: Neurosurgery

## 2011-12-04 DIAGNOSIS — M542 Cervicalgia: Secondary | ICD-10-CM

## 2012-01-06 DIAGNOSIS — J309 Allergic rhinitis, unspecified: Secondary | ICD-10-CM | POA: Diagnosis not present

## 2012-01-15 DIAGNOSIS — J309 Allergic rhinitis, unspecified: Secondary | ICD-10-CM | POA: Diagnosis not present

## 2012-01-17 DIAGNOSIS — J309 Allergic rhinitis, unspecified: Secondary | ICD-10-CM | POA: Diagnosis not present

## 2012-01-21 DIAGNOSIS — I10 Essential (primary) hypertension: Secondary | ICD-10-CM | POA: Diagnosis not present

## 2012-01-21 DIAGNOSIS — E559 Vitamin D deficiency, unspecified: Secondary | ICD-10-CM | POA: Diagnosis not present

## 2012-01-21 DIAGNOSIS — F329 Major depressive disorder, single episode, unspecified: Secondary | ICD-10-CM | POA: Diagnosis not present

## 2012-01-21 DIAGNOSIS — F3289 Other specified depressive episodes: Secondary | ICD-10-CM | POA: Diagnosis not present

## 2012-01-21 DIAGNOSIS — R6889 Other general symptoms and signs: Secondary | ICD-10-CM | POA: Diagnosis not present

## 2012-01-21 DIAGNOSIS — E039 Hypothyroidism, unspecified: Secondary | ICD-10-CM | POA: Diagnosis not present

## 2012-01-21 DIAGNOSIS — L989 Disorder of the skin and subcutaneous tissue, unspecified: Secondary | ICD-10-CM | POA: Diagnosis not present

## 2012-01-21 DIAGNOSIS — E785 Hyperlipidemia, unspecified: Secondary | ICD-10-CM | POA: Diagnosis not present

## 2012-01-21 DIAGNOSIS — J309 Allergic rhinitis, unspecified: Secondary | ICD-10-CM | POA: Diagnosis not present

## 2012-01-29 DIAGNOSIS — J301 Allergic rhinitis due to pollen: Secondary | ICD-10-CM | POA: Diagnosis not present

## 2012-01-29 DIAGNOSIS — J3089 Other allergic rhinitis: Secondary | ICD-10-CM | POA: Diagnosis not present

## 2012-01-29 DIAGNOSIS — J309 Allergic rhinitis, unspecified: Secondary | ICD-10-CM | POA: Diagnosis not present

## 2012-01-29 DIAGNOSIS — Z91018 Allergy to other foods: Secondary | ICD-10-CM | POA: Diagnosis not present

## 2012-02-02 ENCOUNTER — Emergency Department (HOSPITAL_COMMUNITY)
Admission: EM | Admit: 2012-02-02 | Discharge: 2012-02-02 | Disposition: A | Discharge status: Home / Self Care | Payer: Medicare Other | Attending: Emergency Medicine | Admitting: Emergency Medicine

## 2012-02-02 ENCOUNTER — Encounter (HOSPITAL_COMMUNITY): Payer: Self-pay | Admitting: Family Medicine

## 2012-02-02 DIAGNOSIS — R110 Nausea: Secondary | ICD-10-CM | POA: Insufficient documentation

## 2012-02-02 DIAGNOSIS — F341 Dysthymic disorder: Secondary | ICD-10-CM | POA: Diagnosis not present

## 2012-02-02 DIAGNOSIS — E039 Hypothyroidism, unspecified: Secondary | ICD-10-CM | POA: Insufficient documentation

## 2012-02-02 DIAGNOSIS — M810 Age-related osteoporosis without current pathological fracture: Secondary | ICD-10-CM | POA: Insufficient documentation

## 2012-02-02 DIAGNOSIS — I10 Essential (primary) hypertension: Secondary | ICD-10-CM | POA: Insufficient documentation

## 2012-02-02 DIAGNOSIS — Z79899 Other long term (current) drug therapy: Secondary | ICD-10-CM | POA: Diagnosis not present

## 2012-02-02 DIAGNOSIS — K219 Gastro-esophageal reflux disease without esophagitis: Secondary | ICD-10-CM | POA: Diagnosis not present

## 2012-02-02 DIAGNOSIS — Z7982 Long term (current) use of aspirin: Secondary | ICD-10-CM | POA: Diagnosis not present

## 2012-02-02 DIAGNOSIS — I2510 Atherosclerotic heart disease of native coronary artery without angina pectoris: Secondary | ICD-10-CM | POA: Diagnosis not present

## 2012-02-02 DIAGNOSIS — E785 Hyperlipidemia, unspecified: Secondary | ICD-10-CM | POA: Insufficient documentation

## 2012-02-02 DIAGNOSIS — G44209 Tension-type headache, unspecified, not intractable: Secondary | ICD-10-CM | POA: Diagnosis not present

## 2012-02-02 LAB — POCT I-STAT, CHEM 8
BUN: 9 mg/dL (ref 6–23)
Creatinine, Ser: 0.9 mg/dL (ref 0.50–1.10)
Potassium: 3.4 mEq/L — ABNORMAL LOW (ref 3.5–5.1)
Sodium: 139 mEq/L (ref 135–145)
TCO2: 25 mmol/L (ref 0–100)

## 2012-02-02 MED ORDER — ACETAMINOPHEN 325 MG PO TABS
650.0000 mg | ORAL_TABLET | Freq: Once | ORAL | Status: AC
  Administered 2012-02-02: 650 mg via ORAL
  Filled 2012-02-02: qty 2

## 2012-02-02 MED ORDER — LISINOPRIL 10 MG PO TABS
10.0000 mg | ORAL_TABLET | Freq: Once | ORAL | Status: AC
  Administered 2012-02-02: 10 mg via ORAL
  Filled 2012-02-02: qty 1

## 2012-02-02 MED ORDER — ONDANSETRON 4 MG PO TBDP
4.0000 mg | ORAL_TABLET | Freq: Once | ORAL | Status: AC
  Administered 2012-02-02: 4 mg via ORAL
  Filled 2012-02-02: qty 1

## 2012-02-02 MED ORDER — LISINOPRIL 10 MG PO TABS: 10.0000 mg | ORAL_TABLET | Freq: Once | ORAL | Status: DC

## 2012-02-02 NOTE — ED Notes (Signed)
Per pt having HTN that started this am. Did not take her HTN medication. sts has a HA and some nausea. Denies chest pain, SOB. Pt A&Ox3.

## 2012-02-02 NOTE — ED Provider Notes (Signed)
History     CSN: [id number]  Arrival date & time 02/02/12  1478   First MD Initiated Contact with Patient 02/02/12 [phone]      Chief Complaint  Patient presents with  . Hypertension    (Consider location/radiation/quality/duration/timing/severity/associated sxs/prior treatment) Patient is a 78 y.o. female presenting with hypertension. The history is provided by the patient and the spouse.  Hypertension Associated symptoms include headaches. Pertinent negatives include no chest pain, no abdominal pain and no shortness of breath.   the patient is a 78 year old, female, who complains of a squeezing, headache since last night, associated with nausea.  She also is concerned that her blood pressure is elevated.  She did not take her atenolol this morning.  She denies vomiting, fever, rash, vision changes, weakness.  She denies chest pain, abdominal pain, back pain, or urinary tract symptoms.  She is not taking anything for her headache.  Past Medical History  Diagnosis Date  . Coronary artery disease   . Hyperlipidemia   . Orthostatic hypotension   . Hypertension   . Osteoarthrosis, unspecified whether generalized or localized, unspecified site   . Family history of malignant neoplasm of gastrointestinal tract   . Allergic rhinitis, cause unspecified   . Unspecified functional disorder of intestine   . H/O: hysterectomy   . Osteoporosis   . Anxiety and depression   . GERD (gastroesophageal reflux disease)   . Hypothyroidism   . Chronic headaches     Past Surgical History  Procedure Date  . Cardiac catheterization   . Total knee arthroplasty   . Nasal sinus surgery   . Tonsillectomy   . Appendectomy   . Cyst removal on back   . Cervical spine surgery     Family History  Problem Relation Age of Onset  . Cancer Other     brain, colon 1st relative < 60  . Heart disease Other   . Colon cancer Mother   . Heart attack Father 46    History  Substance Use Topics  . Smoking  status: Never Smoker   . Smokeless tobacco: Not on file   Comment: was a passive smoker for 15 yrs  . Alcohol Use: No    OB History    Grav Para Term Preterm Abortions TAB SAB Ect Mult Living                  Review of Systems  Constitutional: Negative for fever and chills.  HENT: Negative for neck pain and neck stiffness.   Eyes: Negative for photophobia and visual disturbance.  Respiratory: Negative for cough and shortness of breath.   Cardiovascular: Negative for chest pain.  Gastrointestinal: Positive for nausea. Negative for vomiting, abdominal pain and diarrhea.  Genitourinary: Negative for dysuria.  Skin: Negative for rash.  Neurological: Positive for headaches. Negative for dizziness and weakness.  Psychiatric/Behavioral: Negative for confusion.  All other systems reviewed and are negative.    Allergies  Aspirin; Prednisone; and Sulfonamide derivatives  Home Medications   Current Outpatient Rx  Name Route Sig Dispense Refill  . ACETAMIN PO Oral Take 0.5 tablets by mouth daily as needed. For headache    . ALENDRONATE SODIUM 70 MG PO TABS Oral Take 70 mg by mouth every 7 (seven) days. Take with a full glass of water on an empty stomach. On Mondays    . ASPIRIN 81 MG PO TBEC Oral Take 1 tablet (81 mg total) by mouth daily. 30 tablet 11  . ATENOLOL 50  MG PO TABS Oral Take 50 mg by mouth daily.     . ATORVASTATIN CALCIUM 20 MG PO TABS Oral Take 20 mg by mouth daily.     Marland Kitchen REFRESH OP Both Eyes Place 1 drop into both eyes daily as needed. For dry eyes    . CELECOXIB 200 MG PO CAPS Oral Take 200 mg by mouth daily.      . DULOXETINE HCL 30 MG PO CPEP Oral Take 30 mg by mouth daily.     . ERGOCALCIFEROL 50000 UNITS PO CAPS Oral Take 50,000 Units by mouth once a week. On Tuesdays    . ESOMEPRAZOLE MAGNESIUM 40 MG PO CPDR Oral Take 40 mg by mouth daily.     Marland Kitchen ESTROGENS CONJUGATED 0.3 MG PO TABS Oral Take 0.3 mg by mouth daily.     Marland Kitchen LEVOTHYROXINE SODIUM 125 MCG PO TABS Oral  Take 125 mcg by mouth daily.    . NON FORMULARY  Allergy shot weekly, allergic to grass, some trees, dust mites  On Wednesdays    . PRESCRIPTION MEDICATION Topical Apply 1 application topically daily as needed. 0.25 % cream for itching in shoulder    . TOPIRAMATE 100 MG PO TABS Oral Take 100 mg by mouth daily.     Marland Kitchen NITROGLYCERIN 0.4 MG SL SUBL Sublingual Place 1 tablet (0.4 mg total) under the tongue every 5 (five) minutes as needed for chest pain. 90 tablet 12    BP 162/88  Pulse 65  Temp(Src) 97.5 F (36.4 C) (Oral)  Resp 21  SpO2 96%  Physical Exam  Constitutional: She is oriented to person, place, and time. She appears well-developed and well-nourished.  HENT:  Head: Normocephalic and atraumatic.  Eyes: EOM are normal. Pupils are equal, round, and reactive to light.  Neck: Normal range of motion. Neck supple.       No nuchal rigidity  Cardiovascular: Normal rate and regular rhythm.   No murmur heard. Pulmonary/Chest: Effort normal and breath sounds normal.  Abdominal: Soft. Bowel sounds are normal. There is no tenderness.  Musculoskeletal: Normal range of motion.  Neurological: She is alert and oriented to person, place, and time. No cranial nerve deficit.  Skin: Skin is warm and dry. No rash noted.  Psychiatric: She has a normal mood and affect.    ED Course  Procedures (including critical care time) 78 year old, female, with constricting-type headache, with mild elevation in blood pressure.  She has a normal mental status.  Normal.  Neurological examination, and no signs of systemic illness.  There is no indication for doing a CAT scan of her head.  Because of her hypertension.  We will check her creatinine level.  I will also treat her headache with Tylenol.  Labs Reviewed - No data to display No results found.   No diagnosis found.  Ha resolved.  SBP 154.  MDM  Tension headache - resolved.  No alteration in mental status.  Normal.  Neurological examination.  No  signs of systemic illness. Hypertension- improved with no evidence of endorgan        Nicholes Stairs, MD 02/02/12 1051

## 2012-02-02 NOTE — ED Notes (Signed)
Pt placed in gown, on monitor, with  Continuous blood pressure and pulse oximetry

## 2012-02-03 ENCOUNTER — Other Ambulatory Visit: Payer: Self-pay | Admitting: Family Medicine

## 2012-02-03 DIAGNOSIS — Z1231 Encounter for screening mammogram for malignant neoplasm of breast: Secondary | ICD-10-CM

## 2012-02-04 DIAGNOSIS — I10 Essential (primary) hypertension: Secondary | ICD-10-CM | POA: Diagnosis not present

## 2012-02-07 ENCOUNTER — Ambulatory Visit
Admission: RE | Admit: 2012-02-07 | Discharge: 2012-02-07 | Disposition: A | Discharge status: Home / Self Care | Payer: Medicare Other | Source: Ambulatory Visit | Attending: Family Medicine | Admitting: Family Medicine

## 2012-02-07 DIAGNOSIS — Z1231 Encounter for screening mammogram for malignant neoplasm of breast: Secondary | ICD-10-CM | POA: Diagnosis not present

## 2012-02-12 DIAGNOSIS — J309 Allergic rhinitis, unspecified: Secondary | ICD-10-CM | POA: Diagnosis not present

## 2012-02-17 DIAGNOSIS — J069 Acute upper respiratory infection, unspecified: Secondary | ICD-10-CM | POA: Diagnosis not present

## 2012-02-17 DIAGNOSIS — I10 Essential (primary) hypertension: Secondary | ICD-10-CM | POA: Diagnosis not present

## 2012-03-03 DIAGNOSIS — J309 Allergic rhinitis, unspecified: Secondary | ICD-10-CM | POA: Diagnosis not present

## 2012-03-06 DIAGNOSIS — J309 Allergic rhinitis, unspecified: Secondary | ICD-10-CM | POA: Diagnosis not present

## 2012-03-10 DIAGNOSIS — I10 Essential (primary) hypertension: Secondary | ICD-10-CM | POA: Diagnosis not present

## 2012-03-12 DIAGNOSIS — J309 Allergic rhinitis, unspecified: Secondary | ICD-10-CM | POA: Diagnosis not present

## 2012-03-16 ENCOUNTER — Telehealth: Payer: Self-pay | Admitting: Cardiology

## 2012-03-16 NOTE — Telephone Encounter (Signed)
Please return call to patient at hm# [phone]  Patient is concerned about blood pressure.  She left a list of BP readings 3/12- 3/18  170/91 pulse 61    March 12 195/91 pulse 66    March  13 185/98 pulse 66    March   14 195/99 pulse 65    March    14 195/100 Pulse 64  March    15  182/106 pulse 64  March 15 176/86 pulse 71    March 15 155/91 pulse 64    March 16 172/93 pulse 64    March 16 181/102 pulse 67  March 17 173/96 pulse 67    March 17 170/90 pulse 67    March 18

## 2012-03-16 NOTE — Telephone Encounter (Signed)
Pt was notified and will come in tomorrow for BP check and labs.

## 2012-03-16 NOTE — Telephone Encounter (Signed)
Pt is complaining of increased bp and increased swelling lower extremities.  Her pcp gave her hctz 25mg  daily which has helped with the swelling but she is concerned about her bp.  She is taking atenolol 50mg  qd.  She denies dizziness or sob.

## 2012-03-16 NOTE — Telephone Encounter (Signed)
Pt to come in for a cbc, bmet and bp check per Dr Tenny Craw.  N/A, LMTC at pt number.

## 2012-03-16 NOTE — Telephone Encounter (Signed)
Fu call °Patient returning your call °

## 2012-03-17 ENCOUNTER — Ambulatory Visit (INDEPENDENT_AMBULATORY_CARE_PROVIDER_SITE_OTHER): Payer: Medicare Other | Admitting: *Deleted

## 2012-03-17 ENCOUNTER — Encounter: Payer: Self-pay | Admitting: *Deleted

## 2012-03-17 VITALS — BP 160/90 | HR 68 | Resp 18 | Wt 180.0 lb

## 2012-03-17 DIAGNOSIS — E785 Hyperlipidemia, unspecified: Secondary | ICD-10-CM

## 2012-03-17 DIAGNOSIS — I10 Essential (primary) hypertension: Secondary | ICD-10-CM | POA: Diagnosis not present

## 2012-03-17 DIAGNOSIS — J309 Allergic rhinitis, unspecified: Secondary | ICD-10-CM | POA: Diagnosis not present

## 2012-03-17 LAB — BASIC METABOLIC PANEL
CO2: 32 mEq/L (ref 19–32)
Chloride: 99 mEq/L (ref 96–112)
Glucose, Bld: 83 mg/dL (ref 70–99)
Potassium: 5.3 mEq/L — ABNORMAL HIGH (ref 3.5–5.1)
Sodium: 137 mEq/L (ref 135–145)

## 2012-03-17 LAB — CBC
RBC: 4.15 Mil/uL (ref 3.87–5.11)
RDW: 14.5 % (ref 11.5–14.6)
WBC: 6.2 10*3/uL (ref 4.5–10.5)

## 2012-03-17 NOTE — Progress Notes (Signed)
Pt here to have manual bp compared to her personal bp machine.  Bp on machine reads 160/93, manual reads 160/90.  Pt just started an increased dose of atenolol per Dr. Tenny Craw and her PCP.  Advised pt to keep strict log of blood pressure's twice daily for 1 week and then report back to her PCP.  Pt agrees.  Also educate pt on reducing salt intake.

## 2012-03-20 ENCOUNTER — Telehealth: Payer: Self-pay | Admitting: *Deleted

## 2012-03-20 DIAGNOSIS — E876 Hypokalemia: Secondary | ICD-10-CM

## 2012-03-20 DIAGNOSIS — J309 Allergic rhinitis, unspecified: Secondary | ICD-10-CM | POA: Diagnosis not present

## 2012-03-20 NOTE — Telephone Encounter (Deleted)
03/20/12--error--nt

## 2012-03-20 NOTE — Telephone Encounter (Signed)
error 

## 2012-03-20 NOTE — Telephone Encounter (Signed)
03/20/12--spoke with [name] [name] and gave results of labs--advised her to decrease foods high in K+, increase fluids and come back in 1 month for repeat BMET--pt agrees--nt

## 2012-03-20 NOTE — Telephone Encounter (Signed)
Gave ms Mcminn some instructions concerning nausea--nt

## 2012-03-20 NOTE — Telephone Encounter (Signed)
This encounter was created in error - please disregard.

## 2012-03-24 ENCOUNTER — Telehealth: Payer: Self-pay | Admitting: Cardiology

## 2012-03-24 DIAGNOSIS — J309 Allergic rhinitis, unspecified: Secondary | ICD-10-CM | POA: Diagnosis not present

## 2012-03-24 DIAGNOSIS — B009 Herpesviral infection, unspecified: Secondary | ICD-10-CM | POA: Diagnosis not present

## 2012-03-24 DIAGNOSIS — D235 Other benign neoplasm of skin of trunk: Secondary | ICD-10-CM | POA: Diagnosis not present

## 2012-03-24 DIAGNOSIS — I872 Venous insufficiency (chronic) (peripheral): Secondary | ICD-10-CM | POA: Diagnosis not present

## 2012-03-24 NOTE — Telephone Encounter (Signed)
Patient wants to know what she should eat to lower per K+ level.

## 2012-03-25 NOTE — Telephone Encounter (Signed)
I spoke with [name] and reviewed foods that are high in potassium to decrease. Mylo Red RN

## 2012-04-02 DIAGNOSIS — J309 Allergic rhinitis, unspecified: Secondary | ICD-10-CM | POA: Diagnosis not present

## 2012-04-09 DIAGNOSIS — M4802 Spinal stenosis, cervical region: Secondary | ICD-10-CM | POA: Diagnosis not present

## 2012-04-10 DIAGNOSIS — J309 Allergic rhinitis, unspecified: Secondary | ICD-10-CM | POA: Diagnosis not present

## 2012-04-14 DIAGNOSIS — J309 Allergic rhinitis, unspecified: Secondary | ICD-10-CM | POA: Diagnosis not present

## 2012-04-21 DIAGNOSIS — E559 Vitamin D deficiency, unspecified: Secondary | ICD-10-CM | POA: Diagnosis not present

## 2012-04-22 ENCOUNTER — Other Ambulatory Visit (INDEPENDENT_AMBULATORY_CARE_PROVIDER_SITE_OTHER): Payer: Medicare Other

## 2012-04-22 DIAGNOSIS — E876 Hypokalemia: Secondary | ICD-10-CM | POA: Diagnosis not present

## 2012-04-22 DIAGNOSIS — J309 Allergic rhinitis, unspecified: Secondary | ICD-10-CM | POA: Diagnosis not present

## 2012-04-22 LAB — BASIC METABOLIC PANEL
BUN: 25 mg/dL — ABNORMAL HIGH (ref 6–23)
CO2: 26 mEq/L (ref 19–32)
Chloride: 104 mEq/L (ref 96–112)
Glucose, Bld: 86 mg/dL (ref 70–99)
Potassium: 3.8 mEq/L (ref 3.5–5.1)

## 2012-04-30 DIAGNOSIS — M7040 Prepatellar bursitis, unspecified knee: Secondary | ICD-10-CM | POA: Diagnosis not present

## 2012-05-05 DIAGNOSIS — M1710 Unilateral primary osteoarthritis, unspecified knee: Secondary | ICD-10-CM | POA: Diagnosis not present

## 2012-05-06 DIAGNOSIS — J309 Allergic rhinitis, unspecified: Secondary | ICD-10-CM | POA: Diagnosis not present

## 2012-05-07 DIAGNOSIS — M7040 Prepatellar bursitis, unspecified knee: Secondary | ICD-10-CM | POA: Diagnosis not present

## 2012-05-11 ENCOUNTER — Encounter: Payer: Self-pay | Admitting: *Deleted

## 2012-05-22 ENCOUNTER — Ambulatory Visit (INDEPENDENT_AMBULATORY_CARE_PROVIDER_SITE_OTHER): Payer: Medicare Other | Admitting: Cardiology

## 2012-05-22 ENCOUNTER — Encounter: Payer: Self-pay | Admitting: Cardiology

## 2012-05-22 VITALS — BP 176/86 | HR 56 | Ht 64.5 in | Wt 183.0 lb

## 2012-05-22 DIAGNOSIS — I10 Essential (primary) hypertension: Secondary | ICD-10-CM | POA: Diagnosis not present

## 2012-05-22 DIAGNOSIS — E785 Hyperlipidemia, unspecified: Secondary | ICD-10-CM

## 2012-05-22 DIAGNOSIS — I2510 Atherosclerotic heart disease of native coronary artery without angina pectoris: Secondary | ICD-10-CM

## 2012-05-22 MED ORDER — LISINOPRIL-HYDROCHLOROTHIAZIDE 20-25 MG PO TABS: 1.0000 | ORAL_TABLET | Freq: Every day | ORAL | Status: DC

## 2012-05-22 MED ORDER — LISINOPRIL-HYDROCHLOROTHIAZIDE 10-12.5 MG PO TABS: 1.0000 | ORAL_TABLET | Freq: Every day | ORAL | Status: DC

## 2012-05-22 NOTE — Assessment & Plan Note (Signed)
Poor control. Even on HCTZ and atenolol it was still under poor control. We'll change her to lisinopril HCTZ. We'll check electrolytes in a week to 10 days. Patient has been noted to eat a lot of potassium rich foods in the past. We have reinforced moderation.

## 2012-05-22 NOTE — Patient Instructions (Addendum)
Your physician has recommended you make the following change in your medication:  Start Lisinopril/HCTZ 20/25mg  1 tablet daily.  Stop HCTZ  Your physician recommends that you return for lab work on June 3,2013 for a bmp.  You do not have to be fasting for this lab.  Your physician recommends that you follow-up with your primary care doctor about your blood pressure.  Remember to bring your blood pressure log with you.  Your physician wants you to follow-up in: 1 year. You will receive a reminder letter in the mail two months in advance. If you don't receive a letter, please call our office to schedule the follow-up appointment.   It is also recommended that you reduce the amount of salt and potassium in your diet. Please see the information below.  Foods Rich in Potassium Food / Potassium (mg)  Apricots, dried,  cup / 378 mg   Apricots, raw, 1 cup halves / 401 mg   Avocado,  / 487 mg   Banana, 1 large / 487 mg   Beef, lean, round, 3 oz / 202 mg   Cantaloupe, 1 cup cubes / 427 mg   Dates, medjool, 5 whole / 835 mg   Ham, cured, 3 oz / 212 mg   Lentils, dried,  cup / 458 mg   Lima beans, frozen,  cup / 258 mg   Orange, 1 large / 333 mg   Orange juice, 1 cup / 443 mg   Peaches, dried,  cup / 398 mg   Peas, split, cooked,  cup / 355 mg   Potato, boiled, 1 medium / 515 mg   Prunes, dried, uncooked,  cup / 318 mg   Raisins,  cup / 309 mg   Salmon, pink, raw, 3 oz / 275 mg   Sardines, canned , 3 oz / 338 mg   Tomato, raw, 1 medium / 292 mg   Tomato juice, 6 oz / 417 mg   [place], 3 oz / 349 mg  Document Released: 12/16/2005 Document Revised: 08/28/2011 Document Reviewed: 05/01/2009 [facility] Patient Information 2012 [place], [place].

## 2012-05-22 NOTE — Progress Notes (Signed)
Addended by: Lisabeth Devoid F on: 05/22/2012 02:19 PM   Modules accepted: Orders

## 2012-05-22 NOTE — Progress Notes (Signed)
HPI [name] [name] returns today for evaluation and management of her history of coronary artery disease and most recently  lower extremity edema and hypertension.  She was emergency room in February with chest pain. She ruled out. She has not had any further chest pain.  He was placed on HCTZ by primary care for edema and hypertension. It helped the edema but her blood pressure remained elevated. She is now the medicine and not refilled it.  She denies orthopnea, PND. Her edema has gotten worse since not taking her meds.  Past Medical History  Diagnosis Date  . Coronary artery disease   . Hyperlipidemia   . Orthostatic hypotension   . Hypertension   . Osteoarthrosis, unspecified whether generalized or localized, unspecified site   . Family history of malignant neoplasm of gastrointestinal tract   . Allergic rhinitis, cause unspecified   . Unspecified functional disorder of intestine   . H/O: hysterectomy   . Osteoporosis   . Anxiety and depression   . GERD (gastroesophageal reflux disease)   . Hypothyroidism   . Chronic headaches     Current Outpatient Prescriptions  Medication Sig Dispense Refill  . Acetaminophen (ACETAMIN PO) Take 0.5 tablets by mouth daily as needed. For headache       . alendronate (FOSAMAX) 70 MG tablet Take 70 mg by mouth every 7 (seven) days. Take with a full glass of water on an empty stomach. On Mondays      . atenolol (TENORMIN) 50 MG tablet Take 50 mg by mouth daily.       Marland Kitchen atorvastatin (LIPITOR) 20 MG tablet Take 40 mg by mouth daily.       . Carboxymethylcellulose Sodium (REFRESH OP) Place 1 drop into both eyes daily as needed. For dry eyes       . celecoxib (CELEBREX) 200 MG capsule Take 200 mg by mouth daily.        . DULoxetine (CYMBALTA) 30 MG capsule Take 30 mg by mouth daily.       . ergocalciferol (VITAMIN D2) 50000 UNITS capsule Take 50,000 Units by mouth once a week. On Tuesdays      . esomeprazole (NEXIUM) 40 MG capsule Take 40 mg by mouth  daily.       Marland Kitchen estrogens, conjugated, (PREMARIN) 0.3 MG tablet Take 0.3 mg by mouth daily.       . hydrochlorothiazide (HYDRODIURIL) 25 MG tablet Take 1 tablet by mouth Daily.      Marland Kitchen levothyroxine (SYNTHROID, LEVOTHROID) 125 MCG tablet Take 125 mcg by mouth daily.      . NON FORMULARY Allergy shot weekly, allergic to grass, some trees, dust mites  On Wednesdays      . PRESCRIPTION MEDICATION Apply 1 application topically daily as needed. 0.25 % cream for itching in shoulder       . topiramate (TOPAMAX) 100 MG tablet Take 100 mg by mouth daily.       . nitroGLYCERIN (NITROSTAT) 0.4 MG SL tablet Place 1 tablet (0.4 mg total) under the tongue every 5 (five) minutes as needed for chest pain.  90 tablet  12    Allergies  Allergen Reactions  . Aspirin Other (See Comments)    Baby aspirin is tolerated, itching in the past  . Prednisone Other (See Comments)    Heart palpitations  . Sulfonamide Derivatives Other (See Comments)    Unknown reaction a long time ago    Family History  Problem Relation Age of Onset  . Brain  cancer Other     1st degree relative  . Heart disease Other   . Colon cancer Mother   . Heart attack Father 13  . Colon cancer      1st degree relative    History   Social History  . Marital Status: Married    Spouse Name: N/A    Number of Children: 0  . Years of Education: N/A   Occupational History  . retired    Social History Main Topics  . Smoking status: Never Smoker   . Smokeless tobacco: Not on file   Comment: was a passive smoker for 15 yrs  . Alcohol Use: No  . Drug Use: No  . Sexually Active: Not on file   Other Topics Concern  . Not on file   Social History Narrative  . No narrative on file    ROS ALL NEGATIVE EXCEPT THOSE NOTED IN HPI  PE  General Appearance: well developed, well nourished in no acute distress, obese HEENT: symmetrical face, PERRLA, good dentition  Neck: no JVD, thyromegaly, or adenopathy, trachea midline Chest:  symmetric without deformity Cardiac: PMI non-displaced, RRR, normal S1, S2, no gallop or murmur Lung: clear to ausculation and percussion Vascular: all pulses full without bruits  Abdominal: nondistended, nontender, good bowel sounds, no HSM, no bruits Extremities: no cyanosis, clubbing, 2+ pitting edema, no sign of DVT, no varicosities  Skin: normal color, no rashes Neuro: alert and oriented x 3, non-focal Pysch: normal affect  EKG Sinus bradycardia, no acute changes. BMET    Component Value Date/Time   NA 138 04/22/2012 1009   K 3.8 04/22/2012 1009   CL 104 04/22/2012 1009   CO2 26 04/22/2012 1009   GLUCOSE 86 04/22/2012 1009   GLUCOSE 93 12/26/2006 1227   BUN 25* 04/22/2012 1009   CREATININE 1.0 04/22/2012 1009   CALCIUM 8.8 04/22/2012 1009   GFRNONAA 53* 10/02/2011 1142   GFRAA 61* 10/02/2011 1142    Lipid Panel     Component Value Date/Time   CHOL 196 12/26/2006 1227   HDL 60.3 12/26/2006 1227   VLDL 26 12/26/2006 1227   LDLCALC 110* 12/26/2006 1227    CBC    Component Value Date/Time   WBC 6.2 03/17/2012 1439   RBC 4.15 03/17/2012 1439   HGB 11.8* 03/17/2012 1439   HCT 36.0 03/17/2012 1439   PLT 266.0 03/17/2012 1439   MCV 86.7 03/17/2012 1439   MCH 28.5 10/02/2011 1142   MCHC 32.6 03/17/2012 1439   RDW 14.5 03/17/2012 1439   LYMPHSABS 2.1 10/02/2011 1142   MONOABS 0.7 10/02/2011 1142   EOSABS 0.2 10/02/2011 1142   BASOSABS 0.1 10/02/2011 1142

## 2012-05-22 NOTE — Assessment & Plan Note (Signed)
Stable. Continue aggressive secondary preventive therapy. Nitroglycerin updated.

## 2012-05-26 DIAGNOSIS — J309 Allergic rhinitis, unspecified: Secondary | ICD-10-CM | POA: Diagnosis not present

## 2012-05-28 DIAGNOSIS — M7040 Prepatellar bursitis, unspecified knee: Secondary | ICD-10-CM | POA: Diagnosis not present

## 2012-06-01 ENCOUNTER — Ambulatory Visit (INDEPENDENT_AMBULATORY_CARE_PROVIDER_SITE_OTHER): Payer: Medicare Other | Admitting: *Deleted

## 2012-06-01 DIAGNOSIS — I2510 Atherosclerotic heart disease of native coronary artery without angina pectoris: Secondary | ICD-10-CM | POA: Diagnosis not present

## 2012-06-01 DIAGNOSIS — I10 Essential (primary) hypertension: Secondary | ICD-10-CM | POA: Diagnosis not present

## 2012-06-01 DIAGNOSIS — E785 Hyperlipidemia, unspecified: Secondary | ICD-10-CM

## 2012-06-01 LAB — BASIC METABOLIC PANEL
CO2: 29 mEq/L (ref 19–32)
Calcium: 8.9 mg/dL (ref 8.4–10.5)
GFR: 68.86 mL/min (ref >60.00)
Sodium: 131 mEq/L — ABNORMAL LOW (ref 135–145)

## 2012-06-03 DIAGNOSIS — X58XXXA Exposure to other specified factors, initial encounter: Secondary | ICD-10-CM | POA: Diagnosis not present

## 2012-06-03 DIAGNOSIS — S83289A Other tear of lateral meniscus, current injury, unspecified knee, initial encounter: Secondary | ICD-10-CM | POA: Diagnosis not present

## 2012-06-03 DIAGNOSIS — Y929 Unspecified place or not applicable: Secondary | ICD-10-CM | POA: Diagnosis not present

## 2012-06-03 DIAGNOSIS — IMO0002 Reserved for concepts with insufficient information to code with codable children: Secondary | ICD-10-CM | POA: Diagnosis not present

## 2012-06-03 DIAGNOSIS — M9420 Chondromalacia, unspecified site: Secondary | ICD-10-CM | POA: Diagnosis not present

## 2012-06-03 DIAGNOSIS — M2240 Chondromalacia patellae, unspecified knee: Secondary | ICD-10-CM | POA: Diagnosis not present

## 2012-06-04 ENCOUNTER — Telehealth: Payer: Self-pay | Admitting: *Deleted

## 2012-06-04 NOTE — Telephone Encounter (Signed)
Message copied by Barrie Folk on Thu Jun 04, 2012 12:07 PM ------      Message from: [place] C      Created: Mon Jun 01, 2012  2:29 PM       Looks ok. How is her BP?

## 2012-06-04 NOTE — Telephone Encounter (Signed)
Pt aware of lab results. She has had her arthroscopic knee surgery this week and is doing well. States her blood pressure sys 120's/ and "it has come down. The swelling in my legs is much better!" Mylo Red RN

## 2012-06-09 DIAGNOSIS — R269 Unspecified abnormalities of gait and mobility: Secondary | ICD-10-CM | POA: Diagnosis not present

## 2012-06-09 DIAGNOSIS — M25569 Pain in unspecified knee: Secondary | ICD-10-CM | POA: Diagnosis not present

## 2012-06-09 DIAGNOSIS — M25539 Pain in unspecified wrist: Secondary | ICD-10-CM | POA: Diagnosis not present

## 2012-06-11 DIAGNOSIS — M25569 Pain in unspecified knee: Secondary | ICD-10-CM | POA: Diagnosis not present

## 2012-06-11 DIAGNOSIS — R269 Unspecified abnormalities of gait and mobility: Secondary | ICD-10-CM | POA: Diagnosis not present

## 2012-06-11 DIAGNOSIS — M25539 Pain in unspecified wrist: Secondary | ICD-10-CM | POA: Diagnosis not present

## 2012-06-15 DIAGNOSIS — M25569 Pain in unspecified knee: Secondary | ICD-10-CM | POA: Diagnosis not present

## 2012-06-15 DIAGNOSIS — M25539 Pain in unspecified wrist: Secondary | ICD-10-CM | POA: Diagnosis not present

## 2012-06-15 DIAGNOSIS — R269 Unspecified abnormalities of gait and mobility: Secondary | ICD-10-CM | POA: Diagnosis not present

## 2012-06-17 DIAGNOSIS — R269 Unspecified abnormalities of gait and mobility: Secondary | ICD-10-CM | POA: Diagnosis not present

## 2012-06-17 DIAGNOSIS — M25569 Pain in unspecified knee: Secondary | ICD-10-CM | POA: Diagnosis not present

## 2012-06-17 DIAGNOSIS — M25539 Pain in unspecified wrist: Secondary | ICD-10-CM | POA: Diagnosis not present

## 2012-06-21 ENCOUNTER — Inpatient Hospital Stay (HOSPITAL_COMMUNITY)
Admission: EM | Admit: 2012-06-21 | Discharge: 2012-06-25 | DRG: 300 | Disposition: A | Discharge status: Home / Self Care | Payer: Medicare Other | Attending: Internal Medicine | Admitting: Internal Medicine

## 2012-06-21 ENCOUNTER — Observation Stay (HOSPITAL_COMMUNITY): Payer: Medicare Other

## 2012-06-21 ENCOUNTER — Encounter (HOSPITAL_COMMUNITY): Payer: Self-pay | Admitting: *Deleted

## 2012-06-21 DIAGNOSIS — E860 Dehydration: Secondary | ICD-10-CM

## 2012-06-21 DIAGNOSIS — E039 Hypothyroidism, unspecified: Secondary | ICD-10-CM | POA: Diagnosis present

## 2012-06-21 DIAGNOSIS — M1990 Unspecified osteoarthritis, unspecified site: Secondary | ICD-10-CM | POA: Diagnosis present

## 2012-06-21 DIAGNOSIS — E782 Mixed hyperlipidemia: Secondary | ICD-10-CM | POA: Diagnosis present

## 2012-06-21 DIAGNOSIS — K648 Other hemorrhoids: Secondary | ICD-10-CM | POA: Diagnosis not present

## 2012-06-21 DIAGNOSIS — I82409 Acute embolism and thrombosis of unspecified deep veins of unspecified lower extremity: Secondary | ICD-10-CM | POA: Diagnosis not present

## 2012-06-21 DIAGNOSIS — R0602 Shortness of breath: Secondary | ICD-10-CM | POA: Diagnosis not present

## 2012-06-21 DIAGNOSIS — K625 Hemorrhage of anus and rectum: Secondary | ICD-10-CM

## 2012-06-21 DIAGNOSIS — K219 Gastro-esophageal reflux disease without esophagitis: Secondary | ICD-10-CM | POA: Diagnosis present

## 2012-06-21 DIAGNOSIS — D500 Iron deficiency anemia secondary to blood loss (chronic): Secondary | ICD-10-CM | POA: Diagnosis present

## 2012-06-21 DIAGNOSIS — I10 Essential (primary) hypertension: Secondary | ICD-10-CM

## 2012-06-21 DIAGNOSIS — F3289 Other specified depressive episodes: Secondary | ICD-10-CM | POA: Diagnosis present

## 2012-06-21 DIAGNOSIS — M7989 Other specified soft tissue disorders: Secondary | ICD-10-CM

## 2012-06-21 DIAGNOSIS — M810 Age-related osteoporosis without current pathological fracture: Secondary | ICD-10-CM | POA: Diagnosis present

## 2012-06-21 DIAGNOSIS — F411 Generalized anxiety disorder: Secondary | ICD-10-CM | POA: Diagnosis present

## 2012-06-21 DIAGNOSIS — F329 Major depressive disorder, single episode, unspecified: Secondary | ICD-10-CM | POA: Diagnosis present

## 2012-06-21 DIAGNOSIS — J309 Allergic rhinitis, unspecified: Secondary | ICD-10-CM

## 2012-06-21 DIAGNOSIS — R7309 Other abnormal glucose: Secondary | ICD-10-CM | POA: Diagnosis not present

## 2012-06-21 DIAGNOSIS — E876 Hypokalemia: Secondary | ICD-10-CM | POA: Diagnosis not present

## 2012-06-21 DIAGNOSIS — Z800 Family history of malignant neoplasm of digestive organs: Secondary | ICD-10-CM

## 2012-06-21 DIAGNOSIS — I2510 Atherosclerotic heart disease of native coronary artery without angina pectoris: Secondary | ICD-10-CM | POA: Diagnosis present

## 2012-06-21 DIAGNOSIS — M7120 Synovial cyst of popliteal space [Baker], unspecified knee: Secondary | ICD-10-CM | POA: Diagnosis present

## 2012-06-21 DIAGNOSIS — R079 Chest pain, unspecified: Secondary | ICD-10-CM

## 2012-06-21 DIAGNOSIS — E871 Hypo-osmolality and hyponatremia: Secondary | ICD-10-CM | POA: Diagnosis not present

## 2012-06-21 DIAGNOSIS — K5909 Other constipation: Secondary | ICD-10-CM

## 2012-06-21 DIAGNOSIS — I749 Embolism and thrombosis of unspecified artery: Secondary | ICD-10-CM | POA: Diagnosis not present

## 2012-06-21 DIAGNOSIS — Z79899 Other long term (current) drug therapy: Secondary | ICD-10-CM

## 2012-06-21 DIAGNOSIS — Z96659 Presence of unspecified artificial knee joint: Secondary | ICD-10-CM

## 2012-06-21 DIAGNOSIS — I951 Orthostatic hypotension: Secondary | ICD-10-CM

## 2012-06-21 DIAGNOSIS — D689 Coagulation defect, unspecified: Secondary | ICD-10-CM

## 2012-06-21 DIAGNOSIS — I824Z9 Acute embolism and thrombosis of unspecified deep veins of unspecified distal lower extremity: Principal | ICD-10-CM | POA: Diagnosis present

## 2012-06-21 DIAGNOSIS — M79609 Pain in unspecified limb: Secondary | ICD-10-CM

## 2012-06-21 DIAGNOSIS — E785 Hyperlipidemia, unspecified: Secondary | ICD-10-CM | POA: Diagnosis present

## 2012-06-21 DIAGNOSIS — E878 Other disorders of electrolyte and fluid balance, not elsewhere classified: Secondary | ICD-10-CM | POA: Diagnosis present

## 2012-06-21 DIAGNOSIS — J9819 Other pulmonary collapse: Secondary | ICD-10-CM | POA: Diagnosis not present

## 2012-06-21 DIAGNOSIS — I87009 Postthrombotic syndrome without complications of unspecified extremity: Secondary | ICD-10-CM

## 2012-06-21 LAB — PROTIME-INR: Prothrombin Time: 14.4 seconds (ref 11.6–15.2)

## 2012-06-21 LAB — CBC
HCT: 31.3 % — ABNORMAL LOW (ref 36.0–46.0)
Hemoglobin: 10.5 g/dL — ABNORMAL LOW (ref 12.0–15.0)
MCH: 27.9 pg (ref 26.0–34.0)
MCHC: 33.5 g/dL (ref 30.0–36.0)
MCV: 83 fL (ref 78.0–100.0)
RDW: 15.8 % — ABNORMAL HIGH (ref 11.5–15.5)

## 2012-06-21 LAB — RETICULOCYTES
RBC.: 3.69 MIL/uL — ABNORMAL LOW (ref 3.87–5.11)
Retic Count, Absolute: 66.4 10*3/uL (ref 19.0–186.0)
Retic Ct Pct: 1.8 % (ref 0.4–3.1)

## 2012-06-21 LAB — BASIC METABOLIC PANEL
BUN: 11 mg/dL (ref 6–23)
Calcium: 8.9 mg/dL (ref 8.4–10.5)
Creatinine, Ser: 0.76 mg/dL (ref 0.50–1.10)
GFR calc Af Amer: >90 mL/min (ref >90)
GFR calc non Af Amer: 79 mL/min — ABNORMAL LOW (ref >90)

## 2012-06-21 LAB — DIFFERENTIAL
Basophils Absolute: 0 10*3/uL (ref 0.0–0.1)
Basophils Relative: 0 % (ref 0–1)
Eosinophils Absolute: 0.1 10*3/uL (ref 0.0–0.7)
Eosinophils Relative: 1 % (ref 0–5)
Monocytes Absolute: 0.5 10*3/uL (ref 0.1–1.0)
Monocytes Relative: 7 % (ref 3–12)
Neutro Abs: 5.6 10*3/uL (ref 1.7–7.7)

## 2012-06-21 MED ORDER — LEVOTHYROXINE SODIUM 125 MCG PO TABS
125.0000 ug | ORAL_TABLET | Freq: Every day | ORAL | Status: DC
  Administered 2012-06-22 – 2012-06-25 (×4): 125 ug via ORAL
  Filled 2012-06-21 (×5): qty 1

## 2012-06-21 MED ORDER — ACETAMINOPHEN 325 MG PO TABS
650.0000 mg | ORAL_TABLET | Freq: Four times a day (QID) | ORAL | Status: DC | PRN
  Filled 2012-06-21: qty 2

## 2012-06-21 MED ORDER — ATORVASTATIN CALCIUM 40 MG PO TABS
40.0000 mg | ORAL_TABLET | Freq: Every day | ORAL | Status: DC
  Administered 2012-06-22 – 2012-06-25 (×4): 40 mg via ORAL
  Filled 2012-06-21 (×4): qty 1

## 2012-06-21 MED ORDER — RIVAROXABAN 10 MG PO TABS: 20.0000 mg | ORAL_TABLET | Freq: Every day | ORAL | Status: DC

## 2012-06-21 MED ORDER — ONDANSETRON HCL 4 MG/2ML IJ SOLN: 4.0000 mg | Freq: Four times a day (QID) | INTRAMUSCULAR | Status: DC | PRN

## 2012-06-21 MED ORDER — HYDROCODONE-ACETAMINOPHEN 5-325 MG PO TABS
1.0000 | ORAL_TABLET | ORAL | Status: DC | PRN
  Administered 2012-06-22 – 2012-06-25 (×11): 1 via ORAL
  Filled 2012-06-21 (×11): qty 1

## 2012-06-21 MED ORDER — DULOXETINE HCL 30 MG PO CPEP
30.0000 mg | ORAL_CAPSULE | Freq: Every day | ORAL | Status: DC
  Administered 2012-06-22 – 2012-06-25 (×4): 30 mg via ORAL
  Filled 2012-06-21 (×4): qty 1

## 2012-06-21 MED ORDER — RIVAROXABAN 15 MG PO TABS
15.0000 mg | ORAL_TABLET | Freq: Two times a day (BID) | ORAL | Status: DC
  Administered 2012-06-22 – 2012-06-25 (×7): 15 mg via ORAL
  Filled 2012-06-21 (×9): qty 1

## 2012-06-21 MED ORDER — ACETAMINOPHEN 650 MG RE SUPP: 650.0000 mg | Freq: Four times a day (QID) | RECTAL | Status: DC | PRN

## 2012-06-21 MED ORDER — PANTOPRAZOLE SODIUM 40 MG PO TBEC
40.0000 mg | DELAYED_RELEASE_TABLET | Freq: Every day | ORAL | Status: DC
  Administered 2012-06-21 – 2012-06-25 (×5): 40 mg via ORAL
  Filled 2012-06-21 (×5): qty 1

## 2012-06-21 MED ORDER — IOHEXOL 350 MG/ML SOLN
100.0000 mL | Freq: Once | INTRAVENOUS | Status: AC | PRN
  Administered 2012-06-21: 100 mL via INTRAVENOUS

## 2012-06-21 MED ORDER — ATENOLOL 50 MG PO TABS
50.0000 mg | ORAL_TABLET | Freq: Every day | ORAL | Status: DC
  Administered 2012-06-22 – 2012-06-25 (×4): 50 mg via ORAL
  Filled 2012-06-21 (×4): qty 1

## 2012-06-21 MED ORDER — SODIUM CHLORIDE 0.9 % IV SOLN
INTRAVENOUS | Status: DC
  Administered 2012-06-21: 23:00:00 via INTRAVENOUS

## 2012-06-21 MED ORDER — ONDANSETRON HCL 4 MG PO TABS: 4.0000 mg | ORAL_TABLET | Freq: Four times a day (QID) | ORAL | Status: DC | PRN

## 2012-06-21 MED ORDER — LISINOPRIL 20 MG PO TABS
20.0000 mg | ORAL_TABLET | Freq: Every day | ORAL | Status: DC
  Filled 2012-06-21: qty 1

## 2012-06-21 MED ORDER — SODIUM CHLORIDE 0.9 % IV BOLUS (SEPSIS)
250.0000 mL | Freq: Once | INTRAVENOUS | Status: AC
  Administered 2012-06-21: 250 mL via INTRAVENOUS

## 2012-06-21 MED ORDER — NITROGLYCERIN 0.4 MG SL SUBL: 0.4000 mg | SUBLINGUAL_TABLET | SUBLINGUAL | Status: DC | PRN

## 2012-06-21 MED ORDER — RIVAROXABAN 15 MG PO TABS
15.0000 mg | ORAL_TABLET | ORAL | Status: AC
  Administered 2012-06-21: 15 mg via ORAL
  Filled 2012-06-21: qty 1

## 2012-06-21 NOTE — Progress Notes (Signed)
*  PRELIMINARY RESULTS* Vascular Ultrasound Left lower extremity venous duplex has been completed.  Preliminary findings: Left= Evidence of DVT involving the left posterior tibial veins in the proximal segment only. A baker's cyst is seen in the medial popliteal fossa.  Farrel Demark RDMS 06/21/2012, 6:27 PM

## 2012-06-21 NOTE — ED Provider Notes (Signed)
History     CSN: [id number]  Arrival date & time 06/21/12  1415   First MD Initiated Contact with Patient 06/21/12 1624      Chief Complaint  Patient presents with  . Leg Swelling    (Consider location/radiation/quality/duration/timing/severity/associated sxs/prior treatment) Patient is a 78 y.o. female presenting with leg pain. The history is provided by the patient.  Leg Pain   78 y/o female INAD c/o swelling to LLE s/p  Arthroscopic knee surgery on 6/5. Pt reports calf pain with non-acute onset of swelling, rednesess and pain increasing gradually x7 days ago.  Pt reports fleeting SOB, denies CP. Pt is taking premarin daily. Denies h/o DVT or cancer.   Past Medical History  Diagnosis Date  . Coronary artery disease   . Hyperlipidemia   . Orthostatic hypotension   . Hypertension   . Osteoarthrosis, unspecified whether generalized or localized, unspecified site   . Family history of malignant neoplasm of gastrointestinal tract   . Allergic rhinitis, cause unspecified   . Unspecified functional disorder of intestine   . H/O: hysterectomy   . Osteoporosis   . Anxiety and depression   . GERD (gastroesophageal reflux disease)   . Hypothyroidism   . Chronic headaches     Past Surgical History  Procedure Date  . Cardiac catheterization   . Total knee arthroplasty   . Nasal sinus surgery   . Tonsillectomy   . Appendectomy   . Cyst removal on back   . Cervical spine surgery   . Knee surgery     Family History  Problem Relation Age of Onset  . Brain cancer Other     1st degree relative  . Heart disease Other   . Colon cancer Mother   . Heart attack Father 64  . Colon cancer      1st degree relative    History  Substance Use Topics  . Smoking status: Never Smoker   . Smokeless tobacco: Not on file   Comment: was a passive smoker for 15 yrs  . Alcohol Use: No    OB History    Grav Para Term Preterm Abortions TAB SAB Ect Mult Living                   Review of Systems  All other systems reviewed and are negative.    Allergies  Aspirin; Prednisone; and Sulfonamide derivatives  Home Medications   Current Outpatient Rx  Name Route Sig Dispense Refill  . ACETAMIN PO Oral Take 0.5 tablets by mouth daily as needed. For headache     . ALENDRONATE SODIUM 70 MG PO TABS Oral Take 70 mg by mouth every 7 (seven) days. Take with a full glass of water on an empty stomach. On Mondays    . ATENOLOL 50 MG PO TABS Oral Take 50 mg by mouth daily.     . ATORVASTATIN CALCIUM 20 MG PO TABS Oral Take 40 mg by mouth daily.     Marland Kitchen REFRESH OP Both Eyes Place 1 drop into both eyes daily as needed. For dry eyes     . CELECOXIB 200 MG PO CAPS Oral Take 200 mg by mouth daily.      . DULOXETINE HCL 30 MG PO CPEP Oral Take 30 mg by mouth daily.     . ERGOCALCIFEROL 50000 UNITS PO CAPS Oral Take 50,000 Units by mouth once a week. On Tuesdays    . ESOMEPRAZOLE MAGNESIUM 40 MG PO CPDR Oral Take 40  mg by mouth daily.     Marland Kitchen ESTROGENS CONJUGATED 0.3 MG PO TABS Oral Take 0.3 mg by mouth daily.     Marland Kitchen LEVOTHYROXINE SODIUM 125 MCG PO TABS Oral Take 125 mcg by mouth daily.    Marland Kitchen LISINOPRIL-HYDROCHLOROTHIAZIDE 20-25 MG PO TABS Oral Take 1 tablet by mouth daily. 90 tablet 3  . NITROGLYCERIN 0.4 MG SL SUBL Sublingual Place 1 tablet (0.4 mg total) under the tongue every 5 (five) minutes as needed for chest pain. 90 tablet 12  . NON FORMULARY  Allergy shot weekly, allergic to grass, some trees, dust mites  On Wednesdays    . PRESCRIPTION MEDICATION Topical Apply 1 application topically daily as needed. 0.25 % cream for itching in shoulder     . TOPIRAMATE 100 MG PO TABS Oral Take 100 mg by mouth daily.       BP 115/71  Pulse 62  Temp 97.7 F (36.5 C) (Oral)  Resp 12  SpO2 100%  Physical Exam  Vitals reviewed. Constitutional: She is oriented to person, place, and time. She appears well-developed and well-nourished. No distress.  HENT:  Head: Normocephalic.  Eyes:  Conjunctivae and EOM are normal. Pupils are equal, round, and reactive to light.  Cardiovascular: Normal rate, regular rhythm and normal heart sounds.   Pulmonary/Chest: Effort normal.  Musculoskeletal: Normal range of motion. She exhibits edema.       Left calf significantly larger than right, with positive Holman's Sign. Pt has superficial collaterals and unilateral pitting edema to upper shin. Pt has bruising to thigh. Skin is atrophic and shiny, no induration. Neurovascularly intact. Bilateral mild/moderate varicosities  Neurological: She is alert and oriented to person, place, and time.  Psychiatric: She has a normal mood and affect.    ED Course  Procedures (including critical care time)  Labs Reviewed - No data to display No results found.   No diagnosis found.    MDM  78 y/o female with left  unilateral leg swelling, calf tenderness s/p arthroscopic knee surgery x18 days ago. Pt has pitting edema with shiny surface to LLE which is significantly larger than the right leg. Positive Holman's sign.  Pt has superficial collateral veins. Pt is high risk (6points) by Wells DVT criteria. Will expedite venous doppler. Pt will be transferred to CDU pending study .       Wynetta Emery, PA-C 06/21/12 1749

## 2012-06-21 NOTE — ED Notes (Signed)
Pt resting quietly at the time. Vital signs stable. Pt updated on plan of care. No signs of distress noted.

## 2012-06-21 NOTE — H&P (Addendum)
[name] [name] is an 78 y.o. female.   PCP - Claude Manges NP at [facility]. Cardiologist - Dr,Wall. Chief Complaint: Left lower extremity swelling. HPI: 78 year-old female who has had a recent left knee arthroscopy on June 5 by [facility] started noticing some swelling in the left lower extremity over the last few weeks. Last one week it worsened. Last evening when she took her dog for a walk she got short of breath and had to take rest at her friend's house and called her husband and who picked her and took her home. Today she came to the ER because of worsening left lower extremity swelling. In the ER patient had Doppler, official results are still pending, showed Baker's cyst and posterior tibial vein DVT. In addition patient was found to be mildly hyponatremic. Patient did have shortness of breath yesterday but none today. Denies any chest pain. Did not have any nausea vomiting or diarrhea. Denies any fever chills. Patient has been admitted for further observation.  Past Medical History  Diagnosis Date  . Coronary artery disease   . Hyperlipidemia   . Orthostatic hypotension   . Hypertension   . Osteoarthrosis, unspecified whether generalized or localized, unspecified site   . Family history of malignant neoplasm of gastrointestinal tract   . Allergic rhinitis, cause unspecified   . Unspecified functional disorder of intestine   . H/O: hysterectomy   . Osteoporosis   . Anxiety and depression   . GERD (gastroesophageal reflux disease)   . Hypothyroidism   . Chronic headaches     Past Surgical History  Procedure Date  . Cardiac catheterization   . Total knee arthroplasty   . Nasal sinus surgery   . Tonsillectomy   . Appendectomy   . Cyst removal on back   . Cervical spine surgery   . Knee surgery     Family History  Problem Relation Age of Onset  . Brain cancer Other     1st degree relative  . Heart disease Other   . Colon cancer Mother   . Heart attack Father 68   . Colon cancer      1st degree relative   Social History:  reports that she has never smoked. She does not have any smokeless tobacco history on file. She reports that she does not drink alcohol or use illicit drugs.  Allergies:  Allergies  Allergen Reactions  . Aspirin Other (See Comments)    Baby aspirin is tolerated, itching in the past  . Prednisone Other (See Comments)    Heart palpitations  . Sulfonamide Derivatives Other (See Comments)    Unknown reaction a long time ago     (Not in a hospital admission)  Results for orders placed during the hospital encounter of 06/21/12 (from the past 48 hour(s))  CBC     Status: Abnormal   Collection Time   06/21/12  5:01 PM      Component Value Range Comment   WBC 7.5  4.0 - 10.5 K/uL    RBC 3.77 (*) 3.87 - 5.11 MIL/uL    Hemoglobin 10.5 (*) 12.0 - 15.0 g/dL    HCT 21.3 (*) 08.6 - 46.0 %    MCV 83.0  78.0 - 100.0 fL    MCH 27.9  26.0 - 34.0 pg    MCHC 33.5  30.0 - 36.0 g/dL    RDW 57.8 (*) 46.9 - 15.5 %    Platelets 338  150 - 400 K/uL  DIFFERENTIAL     Status: Normal   Collection Time   06/21/12  5:01 PM      Component Value Range Comment   Neutrophils Relative 74  43 - 77 %    Neutro Abs 5.6  1.7 - 7.7 K/uL    Lymphocytes Relative 17  12 - 46 %    Lymphs Abs 1.3  0.7 - 4.0 K/uL    Monocytes Relative 7  3 - 12 %    Monocytes Absolute 0.5  0.1 - 1.0 K/uL    Eosinophils Relative 1  0 - 5 %    Eosinophils Absolute 0.1  0.0 - 0.7 K/uL    Basophils Relative 0  0 - 1 %    Basophils Absolute 0.0  0.0 - 0.1 K/uL   BASIC METABOLIC PANEL     Status: Abnormal   Collection Time   06/21/12  5:01 PM      Component Value Range Comment   Sodium 125 (*) 135 - 145 mEq/L    Potassium 3.9  3.5 - 5.1 mEq/L    Chloride 88 (*) 96 - 112 mEq/L    CO2 28  19 - 32 mEq/L    Glucose, Bld 92  70 - 99 mg/dL    BUN 11  6 - 23 mg/dL    Creatinine, Ser 1.61  0.50 - 1.10 mg/dL    Calcium 8.9  8.4 - 09.6 mg/dL    GFR calc non Af Amer 79 (*) >90  mL/min    GFR calc Af Amer >90  >90 mL/min   PROTIME-INR     Status: Normal   Collection Time   06/21/12  5:01 PM      Component Value Range Comment   Prothrombin Time 14.4  11.6 - 15.2 seconds    INR 1.10  0.00 - 1.49   APTT     Status: Abnormal   Collection Time   06/21/12  5:01 PM      Component Value Range Comment   aPTT 38 (*) 24 - 37 seconds    No results found.  Review of Systems  Constitutional: Negative.   HENT: Negative.   Eyes: Negative.   Respiratory: Positive for shortness of breath.   Cardiovascular: Negative.   Gastrointestinal: Negative.   Genitourinary: Negative.   Musculoskeletal:       Left lower extremity swelling.  Skin: Negative.   Neurological: Negative.   Endo/Heme/Allergies: Negative.   Psychiatric/Behavioral: Negative.     Blood pressure 146/72, pulse 58, temperature 97.6 F (36.4 C), temperature source Oral, resp. rate 16, height 5' 4.57" (1.64 m), weight 83 kg (182 lb 15.7 oz), SpO2 99.00%. Physical Exam  Constitutional: She is oriented to person, place, and time. She appears well-developed and well-nourished. No distress.  HENT:  Head: Normocephalic and atraumatic.  Right Ear: External ear normal.  Left Ear: External ear normal.  Nose: Nose normal.  Mouth/Throat: Oropharynx is clear and moist. No oropharyngeal exudate.  Eyes: Conjunctivae are normal. Pupils are equal, round, and reactive to light. Right eye exhibits no discharge. Left eye exhibits no discharge. No scleral icterus.  Neck: Normal range of motion. Neck supple.  Cardiovascular: Normal rate and regular rhythm.   Respiratory: Effort normal and breath sounds normal. No respiratory distress. She has no wheezes. She has no rales.  GI: Soft. Bowel sounds are normal. She exhibits no distension. There is no tenderness. There is no rebound.  Musculoskeletal: She exhibits edema. She exhibits no tenderness.  Left leg swollen below knee. Has bruises in the posterior aspect of knee and  thigh which patient states has been there since her arthroscopy.  Neurological: She is alert and oriented to person, place, and time.       Moves all extremities.  Skin: She is not diaphoretic.  Psychiatric: Her behavior is normal.     Assessment/Plan #1. DVT of the left lower extremity involving the left posterior tibial vein - given her recent surgery and swelling there is significant risk that this DVT can progress proximally. The patient has been started on xarelto. Since patient also complained of shortness of breath which made her stop her walk, we have ordered CT angiogram of the chest to rule out PE. Check cardiac enzymes and BNP. Her risk factor for DVT is the recent surgery and patient being on Premarin which will be discontinued. #2. Hyponatremia - cause is not clear. We will discontinue HCTZ. Gently hydrate. If there is any further worsening of sodium may need further workup. #3. Anemia - patient states she was not aware that she was anemic. She states she did not lose blood anywhere. And has been closely followed by Dr. Arlyce Dice. She does have history of hemorrhoids. She's due for next colonoscopy in 2016. The last one was normal. Check anemia panel. #4. History of CAD - denies chest pain. Continue home medications. #5. Hypertension - continue home medications except for HCTZ. #6. Hypothyroidism - check TSH. Continue Synthroid.  CODE STATUS - full code.  Eduard Clos 06/21/2012, 9:19 PM

## 2012-06-21 NOTE — Plan of Care (Signed)
Problem: Phase I Progression Outcomes Goal: OOB as tolerated unless otherwise ordered Outcome: Progressing Pt is on bedrest for DVT

## 2012-06-21 NOTE — ED Notes (Signed)
Reports having left knee surgery on 6/5. Having bruising and swelling to leg since. Moderate swelling noted today, painful to back of calf.

## 2012-06-21 NOTE — ED Provider Notes (Signed)
Pt seen with PA, she is here for left LE swelling s/p knee arthoscopy Concern for DVT She has distal pulses and distal motor/sensory intact Will get [place] to r/o DVT  Joya Gaskins, MD 06/21/12 [phone]

## 2012-06-21 NOTE — ED Notes (Signed)
Pt presents to department for evaluation of LLE pain and swelling. States she recently had L knee arthroscopy by Dr. Madelon Lips on 06/03/12 . Now states pain and swelling have increased since. States 8/10 pain behind L knee and to entire leg. Bruising noted to L knee. Pedal pulses present.

## 2012-06-21 NOTE — ED Notes (Signed)
Vascular lab called, they are on the way to perform venous duplex. Pt updated on plan of care. She is resting quietly at the time. No signs of distress noted.

## 2012-06-21 NOTE — Progress Notes (Signed)
ANTICOAGULATION CONSULT NOTE - Initial Consult  Pharmacy Consult for Xarelto Indication: DVT  Allergies  Allergen Reactions  . Aspirin Other (See Comments)    Baby aspirin is tolerated, itching in the past  . Prednisone Other (See Comments)    Heart palpitations  . Sulfonamide Derivatives Other (See Comments)    Unknown reaction a long time ago    Patient Measurements: Height: 5' 4.57" (164 cm) Weight: 182 lb 15.7 oz (83 kg) IBW/kg (Calculated) : 56  Heparin Dosing Weight:   Vital Signs: Temp: 97.6 F (36.4 C) (06/23 1830) Temp src: Oral (06/23 1830) BP: 146/72 mmHg (06/23 1830) Pulse Rate: 58  (06/23 1830)  Labs:  Basename 06/21/12 1701  HGB 10.5*  HCT 31.3*  PLT 338  APTT 38*  LABPROT 14.4  INR 1.10  HEPARINUNFRC --  CREATININE 0.76  CKTOTAL --  CKMB --  TROPONINI --    Estimated Creatinine Clearance: 62.1 ml/min (by C-G formula based on Cr of 0.76).   Medical History: Past Medical History  Diagnosis Date  . Coronary artery disease   . Hyperlipidemia   . Orthostatic hypotension   . Hypertension   . Osteoarthrosis, unspecified whether generalized or localized, unspecified site   . Family history of malignant neoplasm of gastrointestinal tract   . Allergic rhinitis, cause unspecified   . Unspecified functional disorder of intestine   . H/O: hysterectomy   . Osteoporosis   . Anxiety and depression   . GERD (gastroesophageal reflux disease)   . Hypothyroidism   . Chronic headaches     Medications:   (Not in a hospital admission)  Assessment: 25 YOF presents with c/o leg swelling, LE dopplers reveal L posterior tibial vein.  Hgb = 10.5, platelets = 3338.  CrCl = 34ml/min.  No major drug interactions with xarelto and prior to admission meds, watch with celebrex.   Goal of Therapy:  Resolution of DVT Monitor platelets by anticoagulation protocol: Yes   Plan:  1. Xarelto 15mg  BIDWC x 3weeks then 20mg  daily with food.  2. Check CBC q72h if  admitted.  Dannielle Huh 06/21/2012,8:29 PM

## 2012-06-22 DIAGNOSIS — I749 Embolism and thrombosis of unspecified artery: Secondary | ICD-10-CM

## 2012-06-22 DIAGNOSIS — E871 Hypo-osmolality and hyponatremia: Secondary | ICD-10-CM | POA: Diagnosis not present

## 2012-06-22 DIAGNOSIS — E782 Mixed hyperlipidemia: Secondary | ICD-10-CM

## 2012-06-22 DIAGNOSIS — I10 Essential (primary) hypertension: Secondary | ICD-10-CM | POA: Diagnosis not present

## 2012-06-22 LAB — PRO B NATRIURETIC PEPTIDE: Pro B Natriuretic peptide (BNP): 1224 pg/mL — ABNORMAL HIGH (ref 0–450)

## 2012-06-22 LAB — CBC
MCH: 28.1 pg (ref 26.0–34.0)
MCHC: 34.1 g/dL (ref 30.0–36.0)
Platelets: 324 10*3/uL (ref 150–400)
RBC: 3.52 MIL/uL — ABNORMAL LOW (ref 3.87–5.11)
RDW: 16 % — ABNORMAL HIGH (ref 11.5–15.5)

## 2012-06-22 LAB — MAGNESIUM: Magnesium: 1.6 mg/dL (ref 1.5–2.5)

## 2012-06-22 LAB — COMPREHENSIVE METABOLIC PANEL
CO2: 26 mEq/L (ref 19–32)
Calcium: 8.6 mg/dL (ref 8.4–10.5)
Chloride: 93 mEq/L — ABNORMAL LOW (ref 96–112)
Creatinine, Ser: 0.74 mg/dL (ref 0.50–1.10)
GFR calc Af Amer: >90 mL/min (ref >90)
GFR calc non Af Amer: 80 mL/min — ABNORMAL LOW (ref >90)
Glucose, Bld: 95 mg/dL (ref 70–99)
Total Bilirubin: 0.6 mg/dL (ref 0.3–1.2)

## 2012-06-22 LAB — IRON AND TIBC
Iron: 45 ug/dL (ref 42–135)
UIBC: 354 ug/dL (ref 125–400)

## 2012-06-22 LAB — FERRITIN: Ferritin: 65 ng/mL (ref 10–291)

## 2012-06-22 LAB — CARDIAC PANEL(CRET KIN+CKTOT+MB+TROPI): Total CK: 55 U/L (ref 7–177)

## 2012-06-22 MED ORDER — LISINOPRIL 10 MG PO TABS
10.0000 mg | ORAL_TABLET | Freq: Every day | ORAL | Status: DC
  Administered 2012-06-23: 10 mg via ORAL
  Filled 2012-06-22: qty 1

## 2012-06-22 MED ORDER — POTASSIUM CHLORIDE CRYS ER 20 MEQ PO TBCR
40.0000 meq | EXTENDED_RELEASE_TABLET | Freq: Two times a day (BID) | ORAL | Status: AC
  Administered 2012-06-22 – 2012-06-23 (×4): 40 meq via ORAL
  Filled 2012-06-22 (×4): qty 2

## 2012-06-22 MED ORDER — POTASSIUM CHLORIDE IN NACL 20-0.9 MEQ/L-% IV SOLN
INTRAVENOUS | Status: DC
  Administered 2012-06-22 – 2012-06-23 (×2): via INTRAVENOUS
  Filled 2012-06-22 (×3): qty 1000

## 2012-06-22 NOTE — Progress Notes (Signed)
Spoke with Dr. Maryln Manuel regarding BP of 94/58 at 1037, patient is due several BP meds, Lisinopril dose changed by MD, also spoke with Dr. Laural Benes regarding activity, instructed that is okay to have bathroom privileges and be up in room to chair, Berle Mull RN

## 2012-06-22 NOTE — Progress Notes (Signed)
Utilization review complete 

## 2012-06-22 NOTE — ED Provider Notes (Signed)
Medical screening examination/treatment/procedure(s) were conducted as a shared visit with non-physician practitioner(s) and myself.  I personally evaluated the patient during the encounter  Pt seen with PA, has LE edema, DVT noted, and I d/w hospitalist  Joya Gaskins, MD 06/22/12 [phone]

## 2012-06-22 NOTE — Progress Notes (Signed)
[facility] Progress Note  06/22/2012  Subjective: Pt reports less edema in the left leg.  Pain is improving and controlled by pain meds as needed.   Objective:  Vital signs in last 24 hours: Filed Vitals:   06/21/12 1900 06/21/12 2153 06/21/12 2231 06/22/12 0500  BP:  129/62 157/71 115/65  Pulse:  68 71 69  Temp:  97.9 F (36.6 C) 97.5 F (36.4 C) 98 F (36.7 C)  TempSrc:  Oral    Resp:  19 20 20   Height: 5' 4.57" (1.64 m)  5\' 4"  (1.626 m)   Weight: 83 kg (182 lb 15.7 oz)  80.3 kg (177 lb 0.5 oz)   SpO2:  100% 100% 97%   Weight change:   Intake/Output Summary (Last 24 hours) at 06/22/12 0857 Last data filed at 06/22/12 0500  Gross per 24 hour  Intake    120 ml  Output    250 ml  Net   -130 ml   No results found for this basename: HGBA1C   Lab Results  Component Value Date   LDLCALC 110* 12/26/2006   CREATININE 0.74 06/22/2012    Review of Systems As above, otherwise all reviewed and reported negative  Physical Exam General - awake, no distress, cooperative HEENT - NCAT, MMM Lungs - BBS, CTA CV - normal s1, s2 sounds Abd - soft, nondistended, no masses, nontender Ext - edema LLE 2+   Lab Results: Results for orders placed during the hospital encounter of 06/21/12 (from the past 24 hour(s))  CBC     Status: Abnormal   Collection Time   06/21/12  5:01 PM      Component Value Range   WBC 7.5  4.0 - 10.5 K/uL   RBC 3.77 (*) 3.87 - 5.11 MIL/uL   Hemoglobin 10.5 (*) 12.0 - 15.0 g/dL   HCT 16.1 (*) 09.6 - 04.5 %   MCV 83.0  78.0 - 100.0 fL   MCH 27.9  26.0 - 34.0 pg   MCHC 33.5  30.0 - 36.0 g/dL   RDW 40.9 (*) 81.1 - 91.4 %   Platelets 338  150 - 400 K/uL  DIFFERENTIAL     Status: Normal   Collection Time   06/21/12  5:01 PM      Component Value Range   Neutrophils Relative 74  43 - 77 %   Neutro Abs 5.6  1.7 - 7.7 K/uL   Lymphocytes Relative 17  12 - 46 %   Lymphs Abs 1.3  0.7 - 4.0 K/uL   Monocytes Relative 7  3 - 12 %   Monocytes Absolute 0.5   0.1 - 1.0 K/uL   Eosinophils Relative 1  0 - 5 %   Eosinophils Absolute 0.1  0.0 - 0.7 K/uL   Basophils Relative 0  0 - 1 %   Basophils Absolute 0.0  0.0 - 0.1 K/uL  BASIC METABOLIC PANEL     Status: Abnormal   Collection Time   06/21/12  5:01 PM      Component Value Range   Sodium 125 (*) 135 - 145 mEq/L   Potassium 3.9  3.5 - 5.1 mEq/L   Chloride 88 (*) 96 - 112 mEq/L   CO2 28  19 - 32 mEq/L   Glucose, Bld 92  70 - 99 mg/dL   BUN 11  6 - 23 mg/dL   Creatinine, Ser 7.82  0.50 - 1.10 mg/dL   Calcium 8.9  8.4 - 95.6 mg/dL  GFR calc non Af Amer 79 (*) >90 mL/min   GFR calc Af Amer >90  >90 mL/min  PROTIME-INR     Status: Normal   Collection Time   06/21/12  5:01 PM      Component Value Range   Prothrombin Time 14.4  11.6 - 15.2 seconds   INR 1.10  0.00 - 1.49  APTT     Status: Abnormal   Collection Time   06/21/12  5:01 PM      Component Value Range   aPTT 38 (*) 24 - 37 seconds  PRO B NATRIURETIC PEPTIDE     Status: Abnormal   Collection Time   06/21/12 10:58 PM      Component Value Range   Pro B Natriuretic peptide (BNP) 1224.0 (*) 0 - 450 pg/mL  RETICULOCYTES     Status: Abnormal   Collection Time   06/21/12 10:58 PM      Component Value Range   Retic Ct Pct 1.8  0.4 - 3.1 %   RBC. 3.69 (*) 3.87 - 5.11 MIL/uL   Retic Count, Manual 66.4  19.0 - 186.0 K/uL  CARDIAC PANEL(CRET KIN+CKTOT+MB+TROPI)     Status: Normal   Collection Time   06/21/12 10:58 PM      Component Value Range   Total CK 55  7 - 177 U/L   CK, MB 2.8  0.3 - 4.0 ng/mL   Troponin I <0.30  <0.30 ng/mL   Relative Index RELATIVE INDEX IS INVALID  0.0 - 2.5  CBC     Status: Abnormal   Collection Time   06/22/12  5:20 AM      Component Value Range   WBC 5.6  4.0 - 10.5 K/uL   RBC 3.52 (*) 3.87 - 5.11 MIL/uL   Hemoglobin 9.9 (*) 12.0 - 15.0 g/dL   HCT 16.1 (*) 09.6 - 04.5 %   MCV 82.4  78.0 - 100.0 fL   MCH 28.1  26.0 - 34.0 pg   MCHC 34.1  30.0 - 36.0 g/dL   RDW 40.9 (*) 81.1 - 91.4 %   Platelets 324   150 - 400 K/uL  COMPREHENSIVE METABOLIC PANEL     Status: Abnormal   Collection Time   06/22/12  5:20 AM      Component Value Range   Sodium 128 (*) 135 - 145 mEq/L   Potassium 3.2 (*) 3.5 - 5.1 mEq/L   Chloride 93 (*) 96 - 112 mEq/L   CO2 26  19 - 32 mEq/L   Glucose, Bld 95  70 - 99 mg/dL   BUN 9  6 - 23 mg/dL   Creatinine, Ser 7.82  0.50 - 1.10 mg/dL   Calcium 8.6  8.4 - 95.6 mg/dL   Total Protein 5.4 (*) 6.0 - 8.3 g/dL   Albumin 2.9 (*) 3.5 - 5.2 g/dL   AST 14  0 - 37 U/L   ALT 8  0 - 35 U/L   Alkaline Phosphatase 85  39 - 117 U/L   Total Bilirubin 0.6  0.3 - 1.2 mg/dL   GFR calc non Af Amer 80 (*) >90 mL/min   GFR calc Af Amer >90  >90 mL/min    Micro Results: No results found for this or any previous visit (from the past 240 hour(s)).  Medications:  Scheduled Meds:   . atenolol  50 mg Oral Daily  . atorvastatin  40 mg Oral Daily  . DULoxetine  30 mg Oral Daily  .  levothyroxine  125 mcg Oral Q0600  . lisinopril  20 mg Oral Daily  . pantoprazole  40 mg Oral Q1200  . potassium chloride  40 mEq Oral BID  . rivaroxaban  20 mg Oral Q supper  . rivaroxaban  15 mg Oral To ER  . rivaroxaban  15 mg Oral BID WC  . sodium chloride  250 mL Intravenous Once   Continuous Infusions:   . 0.9 % NaCl with KCl 20 mEq / L    . DISCONTD: sodium chloride 50 mL/hr at 06/21/12 2304   PRN Meds:.acetaminophen, acetaminophen, HYDROcodone-acetaminophen, iohexol, nitroGLYCERIN, ondansetron (ZOFRAN) IV, ondansetron  Assessment/Plan: DVT of the left lower extremity involving the left posterior tibial vein - Started on Xarelto per pharmacy.  Monitor CBC. No bleeding reported.  Her risk factor for DVT is the recent surgery and patient being on Premarin which will be discontinued.   Hyponatremia - likely from HCTZ.  Discontinued, improving with IVFs. Following.   Anemia - likely postop.  Reviewed her colonoscopy from 2011 c/w diverticulosis.   She's due for next colonoscopy in 2016. The last  one was normal. Check anemia panel.   History of CAD - denies chest pain. Continue home medications.   Hypertension - continue home medications except for HCTZ.   Hypothyroidism - check TSH. Continue Synthroid  Hypokalemia - replace orally and in IVFs, check Mg level.   Dispo: possibly home tomorrow if stable   LOS: 1 day   Jhade Berko 06/22/2012, 8:57 AM  Cleora Fleet, MD, CDE, FAAFP [facility] [place], [place]  [phone]

## 2012-06-23 DIAGNOSIS — K648 Other hemorrhoids: Secondary | ICD-10-CM | POA: Diagnosis not present

## 2012-06-23 DIAGNOSIS — K625 Hemorrhage of anus and rectum: Secondary | ICD-10-CM

## 2012-06-23 DIAGNOSIS — I749 Embolism and thrombosis of unspecified artery: Secondary | ICD-10-CM | POA: Diagnosis not present

## 2012-06-23 DIAGNOSIS — E782 Mixed hyperlipidemia: Secondary | ICD-10-CM | POA: Diagnosis not present

## 2012-06-23 DIAGNOSIS — D689 Coagulation defect, unspecified: Secondary | ICD-10-CM

## 2012-06-23 DIAGNOSIS — I82409 Acute embolism and thrombosis of unspecified deep veins of unspecified lower extremity: Secondary | ICD-10-CM | POA: Diagnosis not present

## 2012-06-23 DIAGNOSIS — I87009 Postthrombotic syndrome without complications of unspecified extremity: Secondary | ICD-10-CM | POA: Diagnosis not present

## 2012-06-23 DIAGNOSIS — E871 Hypo-osmolality and hyponatremia: Secondary | ICD-10-CM | POA: Diagnosis not present

## 2012-06-23 DIAGNOSIS — I10 Essential (primary) hypertension: Secondary | ICD-10-CM

## 2012-06-23 HISTORY — DX: Postthrombotic syndrome without complications of unspecified extremity: I87.009

## 2012-06-23 LAB — BASIC METABOLIC PANEL
CO2: 27 mEq/L (ref 19–32)
Calcium: 8.5 mg/dL (ref 8.4–10.5)
Creatinine, Ser: 0.9 mg/dL (ref 0.50–1.10)
GFR calc non Af Amer: 60 mL/min — ABNORMAL LOW (ref >90)
Glucose, Bld: 89 mg/dL (ref 70–99)
Sodium: 130 mEq/L — ABNORMAL LOW (ref 135–145)

## 2012-06-23 MED ORDER — LISINOPRIL 20 MG PO TABS
20.0000 mg | ORAL_TABLET | Freq: Every day | ORAL | Status: DC
  Administered 2012-06-24 – 2012-06-25 (×2): 20 mg via ORAL
  Filled 2012-06-23 (×2): qty 1

## 2012-06-23 MED ORDER — DOCUSATE SODIUM 100 MG PO CAPS
100.0000 mg | ORAL_CAPSULE | Freq: Every day | ORAL | Status: DC
  Administered 2012-06-24 – 2012-06-25 (×2): 100 mg via ORAL
  Filled 2012-06-23 (×2): qty 1

## 2012-06-23 MED ORDER — HYDROCORTISONE ACETATE 25 MG RE SUPP
25.0000 mg | Freq: Two times a day (BID) | RECTAL | Status: DC
  Administered 2012-06-23 – 2012-06-25 (×4): 25 mg via RECTAL
  Filled 2012-06-23 (×6): qty 1

## 2012-06-23 NOTE — Progress Notes (Signed)
[facility] Progress Note  06/23/2012  Subjective: Pt having more pain and cramping in left calf since ambulating some yesterday.  She denies CP and SOB.  Pain med hasn't helped that much.    Objective:  Vital signs in last 24 hours: Filed Vitals:   06/22/12 1037 06/22/12 1358 06/22/12 2126 06/23/12 0405  BP: 94/58 117/63 156/80 165/77  Pulse: 61 68 61 76  Temp:  97 F (36.1 C) 97.8 F (36.6 C) 97.7 F (36.5 C)  TempSrc:  Oral Oral Oral  Resp: 20 18 20 16   Height:      Weight:      SpO2:  98% 99% 100%   Weight change:   Intake/Output Summary (Last 24 hours) at 06/23/12 1610 Last data filed at 06/23/12 0500  Gross per 24 hour  Intake 1637.5 ml  Output      1 ml  Net 1636.5 ml   No results found for this basename: HGBA1C   Lab Results  Component Value Date   LDLCALC 110* 12/26/2006   CREATININE 0.90 06/23/2012    Review of Systems As above, otherwise all reviewed and reported negative  Physical Exam General - awake, no distress, cooperative  HEENT - NCAT, MMM Lungs - BBS, CTA  CV - normal s1, s2 sounds  Abd - soft, nondistended, no masses, nontender  Ext - tenderness left calf, tightness of the muscle (spasm), edema LLE 2+ pitting  Lab Results: Results for orders placed during the hospital encounter of 06/21/12 (from the past 24 hour(s))  BASIC METABOLIC PANEL     Status: Abnormal   Collection Time   06/23/12  5:05 AM      Component Value Range   Sodium 130 (*) 135 - 145 mEq/L   Potassium 4.8  3.5 - 5.1 mEq/L   Chloride 97  96 - 112 mEq/L   CO2 27  19 - 32 mEq/L   Glucose, Bld 89  70 - 99 mg/dL   BUN 11  6 - 23 mg/dL   Creatinine, Ser 9.60  0.50 - 1.10 mg/dL   Calcium 8.5  8.4 - 45.4 mg/dL   GFR calc non Af Amer 60 (*) >90 mL/min   GFR calc Af Amer 70 (*) >90 mL/min    Micro Results: No results found for this or any previous visit (from the past 240 hour(s)).  Medications:  Scheduled Meds:   . atenolol  50 mg Oral Daily  . atorvastatin   40 mg Oral Daily  . DULoxetine  30 mg Oral Daily  . levothyroxine  125 mcg Oral Q0600  . lisinopril  10 mg Oral Daily  . pantoprazole  40 mg Oral Q1200  . potassium chloride  40 mEq Oral BID  . rivaroxaban  20 mg Oral Q supper  . rivaroxaban  15 mg Oral BID WC  . DISCONTD: lisinopril  20 mg Oral Daily   Continuous Infusions:   . 0.9 % NaCl with KCl 20 mEq / L 50 mL/hr at 06/23/12 0981  . DISCONTD: sodium chloride 50 mL/hr at 06/21/12 2304   PRN Meds:.acetaminophen, acetaminophen, HYDROcodone-acetaminophen, nitroGLYCERIN, ondansetron (ZOFRAN) IV, ondansetron  Assessment/Plan: DVT of the left lower extremity involving the left posterior tibial vein - Continue on Xarelto per pharmacy. Plan 15 mg bid x 3 weeks, then 20 mg daily.  Monitor CBC. No bleeding reported. Her risk factor for DVT is the recent surgery and patient being on Premarin which has been discontinued.   Hyponatremia - likely from HCTZ.  Discontinued, improving with IVFs. Following.   Anemia - likely postop. Reviewed her colonoscopy from 2011 c/w diverticulosis. She's due for next colonoscopy in 2016. The last one was normal.    History of CAD - denies chest pain. Continue home medications.   Hypertension - continue home medications except for HCTZ. Decreased lisinopril to 10 mg.  Follow up with PCP.    Hypothyroidism - low TSH noted. Continue Synthroid but decrease dose to 112 mcg daily.    Hypokalemia - repleted orally and in IVFs. Saline lock IV.   Dispo:   I will recheck pt again this afternoon and reassess to see if she is stable for discharge.     LOS: 2 days   Niemah Schwebke 06/23/2012, 8:11 AM  Cleora Fleet, MD, CDE, FAAFP [facility] [place], [place]  [phone]

## 2012-06-23 NOTE — Plan of Care (Signed)
Problem: Phase I Progression Outcomes Goal: Initial discharge plan identified Outcome: Completed/Met Date Met:  06/23/12 Home with spouse

## 2012-06-23 NOTE — Consult Note (Signed)
[facility] Consult: 3:38 PM 06/23/2012   Referring Provider: Maryln Manuel Primary Care Physician:  Claude Manges, FNP Primary Gastroenterologist:  Dr. Melvia Heaps   Reason for Consultation:  Passing blood per rectum   HPI: [name] [name] is a 78 y.o. female. S/p 06/03/2012 left knee arthroscopy.  Admitted 6/23 with DVT left leg, no PE.   This AM, passed and flushed a soft stool, saw small amount of blood on tissue, mabye 5 to 10 ml.  The second time she wiped after stooling, the blood passed was smaller.  No pain in rectum.  Has occasional minor bleeding PR after hard stools, latest was around 2 months ago.  She normally treats this with Anusol type suppositories.   Up to date colonoscopies, last was in 12/2009.  See reports below.  Hemorrhoids documented in 2006.  No rectal pain.    REVIEW OF SYSTEMS: Large hematoma on left thigh after surgery, is resolving.  Has not required transfusion. No belly pain.  Daily Nexium controls reflux sxs.  No nausea.  Eating Iceburg lettuce causes hiccups. No nose bleeds.  No skin rash or sores.  No blood in urine.  No sore throat.  No Dysphagia.  No headache.  Was ambulatory without walker PTA and actively involved in PT. Tremendous and painful swelling in left foot, leg and thigh, improved since admission, though swelling in foot was a bit worse this AM.    Past Medical History  Diagnosis Date  . Coronary artery disease   . Hyperlipidemia   . Orthostatic hypotension   . Hypertension   . Osteoarthrosis, unspecified whether generalized or localized, unspecified site   . Family history of malignant neoplasm of gastrointestinal tract   . Allergic rhinitis, cause unspecified   . Unspecified functional disorder of intestine   . H/O: hysterectomy   . Osteoporosis   . Anxiety and depression   . GERD (gastroesophageal reflux disease)   . Hypothyroidism   . Chronic headaches     Past Surgical History    Procedure Date  . Cardiac catheterization   . Total knee arthroplasty   . Nasal sinus surgery   . Tonsillectomy   . Appendectomy   . Cyst removal on back   . Cervical spine surgery   . Knee surgery     Prior to Admission medications   Medication Sig Start Date End Date Taking? Authorizing Provider  Acetaminophen (ACETAMIN PO) Take 0.5 tablets by mouth daily as needed. For headache    Yes Historical Provider, MD  atenolol (TENORMIN) 50 MG tablet Take 50 mg by mouth daily.    Yes Historical Provider, MD  atorvastatin (LIPITOR) 20 MG tablet Take 40 mg by mouth daily.    Yes Historical Provider, MD  Carboxymethylcellulose Sodium (REFRESH OP) Place 1 drop into both eyes daily as needed. For dry eyes    Yes Historical Provider, MD  celecoxib (CELEBREX) 200 MG capsule Take 200 mg by mouth daily.     Yes Historical Provider, MD  DULoxetine (CYMBALTA) 30 MG capsule Take 30 mg by mouth daily.    Yes Historical Provider, MD  esomeprazole (NEXIUM) 40 MG capsule Take 40 mg by mouth daily.    Yes Historical Provider, MD  estrogens, conjugated, (PREMARIN) 0.3 MG tablet Take 0.3 mg by mouth daily.    Yes Historical Provider, MD  HYDROcodone-acetaminophen (NORCO) 5-325 MG per tablet Take 1 tablet by mouth every 4 (four) hours as needed. For pain related to surgery   Yes Historical Provider, MD  levothyroxine (SYNTHROID, LEVOTHROID) 125 MCG tablet Take 125 mcg by mouth daily.   Yes Historical Provider, MD  lisinopril-hydrochlorothiazide (PRINZIDE,ZESTORETIC) 20-25 MG per tablet Take 1 tablet by mouth daily. 05/22/12 05/22/13 Yes Gaylord Shih, MD  nitroGLYCERIN (NITROSTAT) 0.4 MG SL tablet Place 1 tablet (0.4 mg total) under the tongue every 5 (five) minutes as needed for chest pain. 05/02/11 05/01/12  Gaylord Shih, MD    Scheduled Meds:    . atenolol  50 mg Oral Daily  . atorvastatin  40 mg Oral Daily  . DULoxetine  30 mg Oral Daily  . levothyroxine  125 mcg Oral Q0600  . lisinopril  10 mg Oral Daily   . pantoprazole  40 mg Oral Q1200  . potassium chloride  40 mEq Oral BID  . rivaroxaban  20 mg Oral Q supper  . rivaroxaban  15 mg Oral BID WC   Infusions:    . DISCONTD: 0.9 % NaCl with KCl 20 mEq / L 50 mL/hr at 06/23/12 0614   PRN Meds: acetaminophen, acetaminophen, HYDROcodone-acetaminophen, nitroGLYCERIN, ondansetron (ZOFRAN) IV, ondansetron   Allergies as of 06/21/2012 - Review Complete 06/21/2012  Allergen Reaction Noted  . Aspirin Other (See Comments) 08/12/2007  . Prednisone Other (See Comments) 08/12/2007  . Sulfonamide derivatives Other (See Comments) 08/12/2007    Family History  Problem Relation Age of Onset  . Brain cancer Other     1st degree relative  . Heart disease Other   . Colon cancer Mother   . Heart attack Father 65  . Colon cancer      1st degree relative    History   Social History  . Marital Status: Married    Spouse Name: N/A    Number of Children: 0  . Years of Education: N/A   Occupational History  . retired    Social History Main Topics  . Smoking status: Never Smoker   . Smokeless tobacco: Not on file   Comment: was a passive smoker for 15 yrs  . Alcohol Use: No  . Drug Use: No  . Sexually Active: Not on file   Other Topics Concern  . Not on file   Social History Narrative  . No narrative on file    PHYSICAL EXAM: Vital signs in last 24 hours: Temp:  [97.7 F (36.5 C)-97.9 F (36.6 C)] 97.9 F (36.6 C) (06/25 1343) Pulse Rate:  [61-76] 61  (06/25 1343) Resp:  [16-20] 17  (06/25 1343) BP: (143-165)/(74-80) 143/74 mmHg (06/25 1343) SpO2:  [96 %-100 %] 96 % (06/25 1343)  General: well appearing wf.  Head:  No swelling, no facial assymetry  Eyes:  No icterus or pallor Ears:  Not HOH  Nose:  No discharge or congestion Mouth:  Clear, moist, pink MM Neck:  No mass or JVD Lungs:  Clear B.  No cough or dyspnea Heart: RRR.  Soft 1/6 Syst murmer Abdomen:  Soft, NT, ND, no mass/bruits/hernia/organomegaly.   Rectal:  visible and palpable internal hemorrhoids are not tender.  No blood on glove.   Musc/Skeltl: swelling of left leg, foot, thigh with resolving hematoma in the thigh.  Pitting 1 to 2 plus in calve and foot Extremities:  Edema as above  Neurologic:  Pleasant, moves all 4 limbs.  No tremor Skin:  No rash.  Bruising in left thigh Tattoos:  none Nodes:  No enlarged nodes in neck or groin   Psych:  Pleasant, not depressed or anxious.   Intake/Output from previous day:  06/24 0701 - 06/25 0700 In: 1637.5 [P.O.:720; I.V.:917.5] Out: 1 [Stool:1] Intake/Output this shift:    LAB RESULTS:  Basename 06/22/12 0520 06/21/12 1701  WBC 5.6 7.5  HGB 9.9* 10.5*  HCT 29.0* 31.3*  PLT 324 338   BMET Lab Results  Component Value Date   NA 130* 06/23/2012   NA 128* 06/22/2012   NA 125* 06/21/2012   K 4.8 06/23/2012   K 3.2* 06/22/2012   K 3.9 06/21/2012   CL 97 06/23/2012   CL 93* 06/22/2012   CL 88* 06/21/2012   CO2 27 06/23/2012   CO2 26 06/22/2012   CO2 28 06/21/2012   GLUCOSE 89 06/23/2012   GLUCOSE 95 06/22/2012   GLUCOSE 92 06/21/2012   BUN 11 06/23/2012   BUN 9 06/22/2012   BUN 11 06/21/2012   CREATININE 0.90 06/23/2012   CREATININE 0.74 06/22/2012   CREATININE 0.76 06/21/2012   CALCIUM 8.5 06/23/2012   CALCIUM 8.6 06/22/2012   CALCIUM 8.9 06/21/2012   LFT  Basename 06/22/12 0520  PROT 5.4*  ALBUMIN 2.9*  AST 14  ALT 8  ALKPHOS 85  BILITOT 0.6  BILIDIR --  IBILI --   PT/INR Lab Results  Component Value Date   INR 1.10 06/21/2012   INR 1.1* 05/02/2011   Hepatitis Panel No results found for this basename: HEPBSAG,HCVAB,HEPAIGM,HEPBIGM in the last 72 hours C-Diff No components found with this basename: cdiff    Drugs of Abuse  No results found for this basename: labopia, cocainscrnur, labbenz, amphetmu, thcu, labbarb     RADIOLOGY STUDIES: Ct Angio Chest W/cm &/or Wo Cm 06/21/2012  *RADIOLOGY REPORT*  Clinical Data: Left lower extremity swelling post arthroscopic knee surgery.  Calf  pain and redness.  Shortness of breath.  Lower extremity DVT.  CT ANGIOGRAPHY CHEST  Technique:  Multidetector CT imaging of the chest using the standard protocol during bolus administration of intravenous contrast. Multiplanar reconstructed images including MIPs were obtained and reviewed to evaluate the vascular anatomy.  Contrast: OMNIPAQUE IOHEXOL 350 MG/ML SOLN  Comparison: None.  Findings: Technically adequate study with good opacification of the central and segmental pulmonary arteries.  No focal filling defects are demonstrated.  No evidence of significant pulmonary embolus.  Mild prominence of heart size.  Scattered calcification of the coronary arteries.  Normal caliber thoracic aorta with mild calcification.  The esophagus is mildly gas distended suggesting dysmotility.  Dependent atelectasis in the lungs.  No focal airspace consolidation.  No significant interstitial infiltration. Airways appear patent.  No pleural effusion.  No pneumothorax. Degenerative changes in the thoracic spine.  Postoperative changes in the lower cervical spine.  IMPRESSION: No evidence of significant pulmonary embolus.  Original Report Authenticated By: Marlon Pel, M.D.    ENDOSCOPIC STUDIES: Jan 2011   Colonoscopy   Robt Arlyce Dice   For screening ,  Mother had colon ca ENDOSCOPIC IMPRESSION:  1) Moderate diverticulosis in the sigmoid colon  2) Otherwise normal examination  RECOMMENDATIONS:  1) high fiber diet  2) Given your significant family history of colon cancer, you  should have a repeat colonoscopy in 5 years   EGD   09/2006   For abd pain Findings  - Normal: Proximal Esophagus to Duodenal 2nd Portion.   Colonoscopy 08/2005 Diagnoses:  455.0: Hemorrhoids, Internal.    IMPRESSION: *  Minor bleeding from internal hemorrhoids in setting of new anticoagulant, xarelto. Hx intermittent hemorrhoidal bleeding in past. *  Left LE DVT, will need anticoagulation for 6 months *  Left knee  arthroscopy 06/04/12 *  Normocytic anemia.  Had significant bruising into thifh post op.  This has slowly been resolving.   PLAN: *  Anusol HC per rectum twice daily, daily stool softener.  CBC in AM.  Continue Xarelto   LOS: 2 days   Jennye Moccasin  06/23/2012, 3:38 PM Pager: [phone]      I have reviewed the above note, examined the patient and agree with plan of treatment. Low volume fresh appearing blood on the toilet tissue x2, Hgb is low due to recent orthopedic surgery. Anticoagulated for DVT x 6 months. Known hemorrhoids, mother had colon cancer- last colon 2011.  Plan: treat  As hemorrhoidal or anal fissure bleeding, continue anticoagulants, consider flex. sigmoidoscopy if bleeding becomes hemodynamically significant

## 2012-06-23 NOTE — Progress Notes (Signed)
Utilization review complete 

## 2012-06-23 NOTE — Progress Notes (Addendum)
Came to reassess patient after having seen blood in stool after soft BM.  Pt says she noticed small amount of blood on tissue after soft BM.  She has a history of internal/external hemorrhoids.  Pt did not check to see if blood was in toilet.  Pt also having more pain in left leg and swelling today.  Suspect pt developing post-thrombotic syndrome.  Placing compression stocking.  Will hold DC, check serial hemoglobin, called her GI Dr. Arlyce Dice Corinda [place]) - will see today.   Maryln Manuel, MD

## 2012-06-23 NOTE — Progress Notes (Signed)
Patient complaining of blood in stool. Blood in toilet witnessed by Rn. V/S stable heart rate 78, B/P 162/75.

## 2012-06-24 DIAGNOSIS — D500 Iron deficiency anemia secondary to blood loss (chronic): Secondary | ICD-10-CM | POA: Diagnosis present

## 2012-06-24 DIAGNOSIS — I824Z9 Acute embolism and thrombosis of unspecified deep veins of unspecified distal lower extremity: Secondary | ICD-10-CM | POA: Diagnosis present

## 2012-06-24 DIAGNOSIS — K648 Other hemorrhoids: Secondary | ICD-10-CM | POA: Diagnosis not present

## 2012-06-24 DIAGNOSIS — K625 Hemorrhage of anus and rectum: Secondary | ICD-10-CM | POA: Diagnosis not present

## 2012-06-24 DIAGNOSIS — E782 Mixed hyperlipidemia: Secondary | ICD-10-CM | POA: Diagnosis present

## 2012-06-24 DIAGNOSIS — E039 Hypothyroidism, unspecified: Secondary | ICD-10-CM | POA: Diagnosis present

## 2012-06-24 DIAGNOSIS — K219 Gastro-esophageal reflux disease without esophagitis: Secondary | ICD-10-CM | POA: Diagnosis present

## 2012-06-24 DIAGNOSIS — M7120 Synovial cyst of popliteal space [Baker], unspecified knee: Secondary | ICD-10-CM | POA: Diagnosis present

## 2012-06-24 DIAGNOSIS — M1990 Unspecified osteoarthritis, unspecified site: Secondary | ICD-10-CM | POA: Diagnosis present

## 2012-06-24 DIAGNOSIS — I749 Embolism and thrombosis of unspecified artery: Secondary | ICD-10-CM | POA: Diagnosis not present

## 2012-06-24 DIAGNOSIS — F411 Generalized anxiety disorder: Secondary | ICD-10-CM | POA: Diagnosis present

## 2012-06-24 DIAGNOSIS — Z79899 Other long term (current) drug therapy: Secondary | ICD-10-CM | POA: Diagnosis not present

## 2012-06-24 DIAGNOSIS — E871 Hypo-osmolality and hyponatremia: Secondary | ICD-10-CM | POA: Diagnosis not present

## 2012-06-24 DIAGNOSIS — E878 Other disorders of electrolyte and fluid balance, not elsewhere classified: Secondary | ICD-10-CM | POA: Diagnosis present

## 2012-06-24 DIAGNOSIS — E876 Hypokalemia: Secondary | ICD-10-CM | POA: Diagnosis not present

## 2012-06-24 DIAGNOSIS — F3289 Other specified depressive episodes: Secondary | ICD-10-CM | POA: Diagnosis present

## 2012-06-24 DIAGNOSIS — R079 Chest pain, unspecified: Secondary | ICD-10-CM | POA: Diagnosis not present

## 2012-06-24 DIAGNOSIS — I2510 Atherosclerotic heart disease of native coronary artery without angina pectoris: Secondary | ICD-10-CM | POA: Diagnosis present

## 2012-06-24 DIAGNOSIS — I82409 Acute embolism and thrombosis of unspecified deep veins of unspecified lower extremity: Secondary | ICD-10-CM | POA: Diagnosis not present

## 2012-06-24 DIAGNOSIS — Z96659 Presence of unspecified artificial knee joint: Secondary | ICD-10-CM | POA: Diagnosis not present

## 2012-06-24 DIAGNOSIS — M810 Age-related osteoporosis without current pathological fracture: Secondary | ICD-10-CM | POA: Diagnosis present

## 2012-06-24 DIAGNOSIS — I10 Essential (primary) hypertension: Secondary | ICD-10-CM | POA: Diagnosis not present

## 2012-06-24 DIAGNOSIS — I951 Orthostatic hypotension: Secondary | ICD-10-CM | POA: Diagnosis present

## 2012-06-24 DIAGNOSIS — R7309 Other abnormal glucose: Secondary | ICD-10-CM | POA: Diagnosis not present

## 2012-06-24 LAB — BASIC METABOLIC PANEL
BUN: 11 mg/dL (ref 6–23)
CO2: 24 mEq/L (ref 19–32)
Calcium: 8.7 mg/dL (ref 8.4–10.5)
Calcium: 8.8 mg/dL (ref 8.4–10.5)
Chloride: 93 mEq/L — ABNORMAL LOW (ref 96–112)
Creatinine, Ser: 0.84 mg/dL (ref 0.50–1.10)
Creatinine, Ser: 0.83 mg/dL (ref 0.50–1.10)
GFR calc Af Amer: 77 mL/min — ABNORMAL LOW (ref >90)
GFR calc non Af Amer: 66 mL/min — ABNORMAL LOW (ref >90)

## 2012-06-24 LAB — CBC
MCHC: 32.5 g/dL (ref 30.0–36.0)
MCV: 84.3 fL (ref 78.0–100.0)
Platelets: 329 10*3/uL (ref 150–400)
RDW: 16.1 % — ABNORMAL HIGH (ref 11.5–15.5)
WBC: 6.8 10*3/uL (ref 4.0–10.5)

## 2012-06-24 MED ORDER — HYDROCODONE-ACETAMINOPHEN 5-325 MG PO TABS
2.0000 | ORAL_TABLET | Freq: Once | ORAL | Status: AC
  Administered 2012-06-24: 2 via ORAL
  Filled 2012-06-24: qty 2

## 2012-06-24 NOTE — Progress Notes (Signed)
     [facility] Daily Rounding Note 06/24/2012, 10:06 AM  SUBJECTIVE:       No rectal bleeding with BM this AM.  Feels well  OBJECTIVE:        General: looks well     Vital signs in last 24 hours:    Temp:  [97.7 F (36.5 C)-98.1 F (36.7 C)] 98.1 F (36.7 C) (06/26 0509) Pulse Rate:  [61-65] 65  (06/26 0509) Resp:  [16-20] 20  (06/26 0509) BP: (132-149)/(72-79) 132/72 mmHg (06/26 0509) SpO2:  [95 %-99 %] 95 % (06/26 0509) Last BM Date: 06/24/12  Heart: RRR Chest: clear B.   No resp distress Abdomen: soft, NT, ND.  No mass or bruits.  Extremities: swelling and resolving bruising of left LE Neuro/Psych:  Pleasant, not confused or anxious.   Intake/Output from previous day: 06/25 0701 - 06/26 0700 In: 240 [P.O.:240] Out: 401 [Urine:400; Stool:1]  Intake/Output this shift: Total I/O In: 240 [P.O.:240] Out: 400 [Urine:400]  Lab Results:  [facility] 06/24/12 0635 06/22/12 0520 06/21/12 1701  WBC 6.8 5.6 7.5  HGB 10.3* 9.9* 10.5*  HCT 31.7* 29.0* 31.3*  PLT 329 324 338   BMET  Basename 06/24/12 0844 06/24/12 0635 06/23/12 0505  NA 128* 128* 130*  K 5.1 6.5* 4.8  CL 93* 95* 97  CO2 24 24 27   GLUCOSE 204* 113* 89  BUN 11 11 11   CREATININE 0.83 0.84 0.90  CALCIUM 8.8 8.7 8.5   LFT  Basename 06/22/12 0520  PROT 5.4*  ALBUMIN 2.9*  AST 14  ALT 8  ALKPHOS 85  BILITOT 0.6  BILIDIR --  IBILI --   ASSESMENT: * Minor bleeding from internal hemorrhoids in setting of new anticoagulant, xarelto on 6/25.  Hx intermittent hemorrhoidal bleeding in past. Anusol HC suppositories added 6/25 * Left LE DVT, will need anticoagulation for 6 months  * Left knee arthroscopy 06/04/12  * Normocytic anemia. Had significant bruising into thigh post op. This has slowly been resolving and the Hgb/crit have improved over last 24 hours.  *  Hyperkalemia, hyponatremia and hypochloremia.   *  Hyperglycemia.    PLAN: *  Complete 3 days of Anusol PR.  Then begin use prn if bleeding  recurs.  *  If significant rectal hemorrhaging, may require referral to general surgery.    LOS: 3 days   [name] [name]  06/24/2012, 10:06 AM Pager: [phone]  Patient seen and I agree with the above documentation including the assessment and plan

## 2012-06-24 NOTE — Evaluation (Signed)
Physical Therapy Evaluation Patient Details Name: [name] [name] MRN: [id number] DOB: 1933/02/28 Today's Date: 06/24/2012 Time: 2841-3244 PT Time Calculation (min): 30 min  PT Assessment / Plan / Recommendation Clinical Impression  Pt is 78 yo female with LLE DVT after knee surgery who is having difficulty with ambulation due to LLE pain.  Recommend acute PT to follow and return to outpt PT after d/c.  Pt would benefit from RW for home use.    PT Assessment  Patient needs continued PT services    Follow Up Recommendations  Outpatient PT    Barriers to Discharge None      Equipment Recommendations  Rolling walker with 5" wheels    Recommendations for Other Services     Frequency Min 5X/week    Precautions / Restrictions Precautions Precautions: None Restrictions Weight Bearing Restrictions: No   Pertinent Vitals/Pain Painful left lower leg, unrated, increases with WB'ing      Mobility  Bed Mobility Bed Mobility: Supine to Sit Supine to Sit: 7: Independent Transfers Transfers: Sit to Stand;Stand to Sit Sit to Stand: 7: Independent Stand to Sit: 7: Independent Ambulation/Gait Ambulation/Gait Assistance: 6: Modified independent (Device/Increase time) Ambulation Distance (Feet): 100 Feet Assistive device: Rolling walker Ambulation/Gait Assistance Details: pt unable to get left heel to floor in standing, decreased WB'ing LLE by using RW.  Pt safe with ambulation with no LOB, recommended she continue ambulating with family and nsg Gait Pattern: Decreased stance time - left;Decreased weight shift to left;Step-to pattern (no heel strike left) Gait velocity: decreased Stairs: No Wheelchair Mobility Wheelchair Mobility: No    Exercises General Exercises - Lower Extremity Ankle Circles/Pumps: AROM;Both;20 reps Quad Sets: Left;10 reps Long Arc Quad: AROM;10 reps;Left Heel Slides: AROM;Left;10 reps   PT Diagnosis: Abnormality of gait;Acute pain  PT Problem List: Decreased  strength;Decreased range of motion;Decreased mobility;Pain PT Treatment Interventions: Gait training;Stair training;DME instruction;Functional mobility training;Therapeutic activities;Therapeutic exercise;Patient/family education   PT Goals Acute Rehab PT Goals PT Goal Formulation: With patient Time For Goal Achievement: 07/01/12 Potential to Achieve Goals: Good Pt will Ambulate: >150 feet;with modified independence;with rolling walker (getting left heel to floor) PT Goal: Ambulate - Progress: Goal set today Pt will Go Up / Down Stairs: Flight;with supervision;with rail(s) PT Goal: Up/Down Stairs - Progress: Goal set today Pt will Perform Home Exercise Program: Independently PT Goal: Perform Home Exercise Program - Progress: Goal set today  Visit Information  Last PT Received On: 06/24/12 Assistance Needed: +1    Subjective Data  Subjective: I was doing so well Patient Stated Goal: return home   Prior Functioning  Home Living Lives With: Spouse Available Help at Discharge: Family;Available PRN/intermittently Type of Home: Apartment Home Access: Level entry Home Layout: Two level Alternate Level Stairs-Number of Steps: flight Alternate Level Stairs-Rails: Right Home Adaptive Equipment: Walker - standard (SW 78 yrs old) Prior Function Level of Independence: Independent Able to Take Stairs?: Yes Vocation: Retired Comments: pt was healing well from left knee arthroscopy and going to outpt PT when she began to hav increased swelling, pain, and tighness of the LLE distally Communication Communication: No difficulties    Cognition  Overall Cognitive Status: Appears within functional limits for tasks assessed/performed Arousal/Alertness: Awake/alert Orientation Level: Appears intact for tasks assessed Behavior During Session: [facility] for tasks performed    Extremity/Trunk Assessment Right Upper Extremity Assessment RUE ROM/Strength/Tone: Within functional levels RUE Sensation:  WFL - Light Touch;WFL - Proprioception RUE Coordination: WFL - gross/fine motor Left Upper Extremity Assessment LUE ROM/Strength/Tone: Within functional  levels LUE Sensation: WFL - Light Touch;WFL - Proprioception LUE Coordination: WFL - gross/fine motor Right Lower Extremity Assessment RLE ROM/Strength/Tone: WFL for tasks assessed RLE Sensation: WFL - Light Touch;WFL - Proprioception RLE Coordination: WFL - gross/fine motor Left Lower Extremity Assessment LLE ROM/Strength/Tone: Deficits LLE ROM/Strength/Tone Deficits: knee flexion 95 degrees by visual assessment, strength grossly 4/5, pt still recovering from surgery.  Though pt cannot get left heel to floor in standing she does have normal ankle ROM in supine LLE Sensation: WFL - Light Touch;WFL - Proprioception LLE Coordination: WFL - gross/fine motor Trunk Assessment Trunk Assessment: Normal   Balance Balance Balance Assessed: Yes Dynamic Standing Balance Dynamic Standing - Balance Support: No upper extremity supported;During functional activity Dynamic Standing - Level of Assistance: 6: Modified independent (Device/Increase time)  End of Session PT - End of Session Equipment Utilized During Treatment: Gait belt Activity Tolerance: Patient limited by pain Patient left: in chair;with call bell/phone within reach Nurse Communication: Mobility status  GP   Lyanne Co, PT  Acute Rehab Services  [phone]   Lyanne Co 06/24/2012, 1:57 PM

## 2012-06-24 NOTE — Progress Notes (Signed)
CRITICAL VALUE ALERT  Critical value received:  K 6.5  Date of notification:  06/24/12  Time of notification:  0845  Critical value read back: yes  Nurse who received alert:  Erskine Speed  MD notified (1st page):  Dr.Reddy  Time of first page: [phone]  MD notified (2nd page):N/A  Time of second page:N/A  Responding MD:  Dr.Reddy  Time MD responded:  [phone]

## 2012-06-24 NOTE — Progress Notes (Signed)
Subjective: Patient complaining of leg pain from a DVT.  No other specific complaints.  Objective: Vital signs in last 24 hours: Filed Vitals:   06/23/12 1343 06/23/12 2127 06/24/12 0509 06/24/12 1342  BP: 143/74 149/79 132/72 126/67  Pulse: 61 63 65 64  Temp: 97.9 F (36.6 C) 97.7 F (36.5 C) 98.1 F (36.7 C) 97.6 F (36.4 C)  TempSrc: Oral Oral Oral Oral  Resp: 17 16 20 20   Height:      Weight:      SpO2: 96% 99% 95% 97%   Weight change:   Intake/Output Summary (Last 24 hours) at 06/24/12 1432 Last data filed at 06/24/12 1300  Gross per 24 hour  Intake    720 ml  Output    801 ml  Net    -81 ml    Physical Exam: General: Awake, Oriented, No acute distress. HEENT: EOMI. Neck: Supple CV: S1 and S2 Lungs: Clear to ascultation bilaterally Abdomen: Soft, Nontender, Nondistended, +bowel sounds. Ext: Good pulses.  Left lower extremity swollen, tender, not erythematous.  Lab Results: Basic Metabolic Panel:  Lab 06/24/12 4098 06/24/12 0635 06/23/12 0505 06/22/12 0520 06/21/12 1701  NA 128* 128* 130* 128* 125*  K 5.1 6.5* 4.8 3.2* 3.9  CL 93* 95* 97 93* 88*  CO2 24 24 27 26 28   GLUCOSE 204* 113* 89 95 92  BUN 11 11 11 9 11   CREATININE 0.83 0.84 0.90 0.74 0.76  CALCIUM 8.8 8.7 8.5 8.6 8.9  MG -- -- -- 1.6 --  PHOS -- -- -- -- --   Liver Function Tests:  Lab 06/22/12 0520  AST 14  ALT 8  ALKPHOS 85  BILITOT 0.6  PROT 5.4*  ALBUMIN 2.9*   No results found for this basename: LIPASE:5,AMYLASE:5 in the last 168 hours No results found for this basename: AMMONIA:5 in the last 168 hours CBC:  Lab 06/24/12 0635 06/22/12 0520 06/21/12 1701  WBC 6.8 5.6 7.5  NEUTROABS -- -- 5.6  HGB 10.3* 9.9* 10.5*  HCT 31.7* 29.0* 31.3*  MCV 84.3 82.4 83.0  PLT 329 324 338   Cardiac Enzymes:  Lab 06/21/12 2258  CKTOTAL 55  CKMB 2.8  CKMBINDEX --  TROPONINI <0.30   BNP (last 3 results)  Basename 06/21/12 2258  PROBNP 1224.0*   CBG: No results found for this  basename: GLUCAP:5 in the last 168 hours No results found for this basename: HGBA1C:5 in the last 72 hours Other Labs: No components found with this basename: POCBNP:3 No results found for this basename: DDIMER:2 in the last 168 hours No results found for this basename: CHOL:2,HDL:2,LDLCALC:2,TRIG:2,CHOLHDL:2,LDLDIRECT:2 in the last 168 hours  Lab 06/21/12 2258  TSH 0.069*  T4TOTAL --  T3FREE --  FREET4 --  THYROIDAB --    Lab 06/21/12 2258  VITAMINB12 297  FOLATE >20.0  FERRITIN 65  TIBC 399  IRON 45  RETICCTPCT 1.8    Micro Results: No results found for this or any previous visit (from the past 240 hour(s)).  Studies/Results: No results found.  Medications: I have reviewed the patient's current medications. Scheduled Meds:   . atenolol  50 mg Oral Daily  . atorvastatin  40 mg Oral Daily  . docusate sodium  100 mg Oral Daily  . DULoxetine  30 mg Oral Daily  . hydrocortisone  25 mg Rectal BID  . levothyroxine  125 mcg Oral Q0600  . lisinopril  20 mg Oral Daily  . pantoprazole  40 mg Oral Q1200  .  potassium chloride  40 mEq Oral BID  . rivaroxaban  20 mg Oral Q supper  . rivaroxaban  15 mg Oral BID WC  . DISCONTD: lisinopril  10 mg Oral Daily   Continuous Infusions:  PRN Meds:.acetaminophen, acetaminophen, HYDROcodone-acetaminophen, nitroGLYCERIN, ondansetron (ZOFRAN) IV, ondansetron  Assessment/Plan: DVT of the left lower extremity involving the left posterior tibial vein  Continue on Xarelto per pharmacy. Plan 15 mg bid x 3 weeks, then 20 mg daily. Monitor CBC. No bleeding reported. Her risk factor for DVT is the recent surgery and patient being on Premarin which has been discontinued.   Hyponatremia  Likely from HCTZ, which has been discontinued. On gentle hydration. Fluid restrict the patient to 1.5 L. Continue to monitor.   Anemia  Likely due to blood loss from recent procedure. Reviewed her colonoscopy from 2011 c/w diverticulosis. She's due for next  colonoscopy in 2016. The last one was normal.   Minor bleeding from internal hemorrhoids from new anticoagulant 3 days of Anusol PR then PRN if bleeding reoccurs. Appreciate GI input.  History of CAD  Denies chest pain. Continue home medications.   Hypertension  Continue home medications except for HCTZ. Decreased lisinopril to 10 mg. Follow up with PCP.   Hypothyroidism  Low TSH noted. Continue Synthroid but decrease dose to 112 mcg daily.   Hypokalemia  Repleted orally. Had K of 6.5 today likely due to lab error on recheck was normal.  Dispo: Plan to discharge the patient tomorrow if hemoglobin is stable, if not further bleeding and if sodium is improved.    LOS: 3 days  Chavie Kolinski A, MD 06/24/2012, 2:32 PM

## 2012-06-24 NOTE — Care Management Note (Signed)
    Page 1 of 1   06/24/2012     3:08:14 PM   CARE MANAGEMENT NOTE 06/24/2012  Patient:  [name] [name]   Account Number:  [id number]  Date Initiated:  06/24/2012  Documentation initiated by:  [facility]  Subjective/Objective Assessment:   DVT - now with GIB  Has husband - independent prior to admission.     Action/Plan:   Anticipated DC Date:  06/25/2012   Anticipated DC Plan:  HOME/SELF CARE      DC Planning Services  CM consult      Choice offered to / List presented to:     DME arranged  Levan Hurst      DME agency  [facility]        Status of service:  In process, will continue to follow Medicare Important Message given?   (If response is "NO", the following Medicare IM given date fields will be blank) Date Medicare IM given:   Date Additional Medicare IM given:    Discharge Disposition:    Per UR Regulation:  Reviewed for med. necessity/level of care/duration of stay  If discussed at Long Length of Stay Meetings, dates discussed:    Comments:  06-24-12 [name] - [phone] Met with patient and husband.  Requests RW on discharge - order obtained and referal made.  Would like to know cost of Xaralto - co pay - benefit request sent to CMA.  Patient uses on line pharmacy - [facility].  Given 10 day xaralto card.  Physician aware needs two presciptions - one for 10 day and one for continued dose after10 days.  Will be giving patient scripts to take home and send in.

## 2012-06-24 NOTE — Progress Notes (Signed)
Rn contacted Dr.Reddy concerning critical lab result K 6.5. Per MD get STAT BMET & EKG.    Margit Batte,Rn,BSN

## 2012-06-25 DIAGNOSIS — K625 Hemorrhage of anus and rectum: Secondary | ICD-10-CM

## 2012-06-25 DIAGNOSIS — I10 Essential (primary) hypertension: Secondary | ICD-10-CM

## 2012-06-25 DIAGNOSIS — E871 Hypo-osmolality and hyponatremia: Secondary | ICD-10-CM

## 2012-06-25 DIAGNOSIS — I749 Embolism and thrombosis of unspecified artery: Secondary | ICD-10-CM | POA: Diagnosis not present

## 2012-06-25 LAB — BASIC METABOLIC PANEL
Chloride: 95 mEq/L — ABNORMAL LOW (ref 96–112)
GFR calc Af Amer: 80 mL/min — ABNORMAL LOW (ref >90)
GFR calc non Af Amer: 69 mL/min — ABNORMAL LOW (ref >90)
Potassium: 4 mEq/L (ref 3.5–5.1)
Sodium: 132 mEq/L — ABNORMAL LOW (ref 135–145)

## 2012-06-25 LAB — CBC
HCT: 32.5 % — ABNORMAL LOW (ref 36.0–46.0)
MCHC: 32.3 g/dL (ref 30.0–36.0)
Platelets: 371 10*3/uL (ref 150–400)
RDW: 16.2 % — ABNORMAL HIGH (ref 11.5–15.5)
WBC: 11.7 10*3/uL — ABNORMAL HIGH (ref 4.0–10.5)

## 2012-06-25 MED ORDER — RIVAROXABAN 20 MG PO TABS: 15.0000 mg | ORAL_TABLET | Freq: Two times a day (BID) | ORAL | Status: DC

## 2012-06-25 MED ORDER — RIVAROXABAN 20 MG PO TABS: ORAL_TABLET | ORAL | Status: DC

## 2012-06-25 MED ORDER — LISINOPRIL 20 MG PO TABS: 20.0000 mg | ORAL_TABLET | Freq: Every day | ORAL | Status: DC

## 2012-06-25 MED ORDER — HYDROCORTISONE ACETATE 25 MG RE SUPP: RECTAL | Status: DC

## 2012-06-25 MED ORDER — UNABLE TO FIND: Status: DC

## 2012-06-25 NOTE — Discharge Summary (Signed)
Discharge Summary  [name] [name] MR#: [id number]  DOB:Mar 22, 1933  Date of Admission: 06/21/2012 Date of Discharge: 06/25/2012  Patient's PCP: Claude Manges, FNP  Attending Physician:Vinisha Faxon A  Consults: [facility] (Dr. Juanda Chance)  Discharge Diagnoses: Principal Problem:  *DVT, lower extremity Active Problems:  HYPERLIPIDEMIA-MIXED  HYPERTENSION  CAD, NATIVE VESSEL  ALLERGIC RHINITIS  Hyponatremia  Postthrombotic syndrome  Hemorrhage of rectum and anus  Other and unspecified coagulation defects   Brief Admitting History and Physical 78 year-old female who has had a recent left knee arthroscopy on June 5 by [facility] started noticing some swelling in the left lower extremity over the last few weeks on 06/21/2012.   Discharge Medications Medication List  As of 06/25/2012 12:08 PM   STOP taking these medications         estrogens (conjugated) 0.3 MG tablet      lisinopril-hydrochlorothiazide 20-25 MG per tablet         TAKE these medications         ACETAMIN PO   Take 0.5 tablets by mouth daily as needed. For headache      atenolol 50 MG tablet   Commonly known as: TENORMIN   Take 50 mg by mouth daily.      atorvastatin 20 MG tablet   Commonly known as: LIPITOR   Take 40 mg by mouth daily.      celecoxib 200 MG capsule   Commonly known as: CELEBREX   Take 200 mg by mouth daily.      DULoxetine 30 MG capsule   Commonly known as: CYMBALTA   Take 30 mg by mouth daily.      esomeprazole 40 MG capsule   Commonly known as: NEXIUM   Take 40 mg by mouth daily.      HYDROcodone-acetaminophen 5-325 MG per tablet   Commonly known as: NORCO   Take 1 tablet by mouth every 4 (four) hours as needed. For pain related to surgery      hydrocortisone 25 MG suppository   Commonly known as: ANUSOL-HC   Use twice daily for one day then as needed for discomfort or small rectal bleeding.      levothyroxine 125 MCG tablet   Commonly known as: SYNTHROID, LEVOTHROID   Take 125  mcg by mouth daily.      lisinopril 20 MG tablet   Commonly known as: PRINIVIL,ZESTRIL   Take 1 tablet (20 mg total) by mouth daily.      nitroGLYCERIN 0.4 MG SL tablet   Commonly known as: NITROSTAT   Place 1 tablet (0.4 mg total) under the tongue every 5 (five) minutes as needed for chest pain.      REFRESH OP   Place 1 drop into both eyes daily as needed. For dry eyes      Rivaroxaban 20 MG Tabs   Commonly known as: XARELTO   Take 1 tablet (20 mg total) by mouth 2 (two) times daily. 15 mg twice daily until 07/13/12 then 20 mg daily there after.      UNABLE TO FIND   Outpatient Physical Therapy for DVT.            Hospital Course: DVT of the left lower extremity involving the left posterior tibial vein  Started on Xarelto. Plan 15 mg bid x 3 weeks, then 20 mg daily. Her risk factor for DVT is the recent surgery and patient being on Premarin which has been discontinued.   Hyponatremia  Improved. Likely from HCTZ, which has been  discontinued. Fluid restrict the patient to 1.5 L while inpatient, but liberalized fluid at discharge. Continue to monitor. Instructed the patient to check labs in 1 week at PCP's office.  Anemia  Likely due to blood loss from recent procedure. Colonoscopy from 2011 c/w diverticulosis. She's due for next colonoscopy in 2016.   Minor bleeding from internal hemorrhoids from new anticoagulant  3 days of Anusol PR then PRN if bleeding reoccurs. GI, Dr. Juanda Chance, was consulted recommended anusol PR. No further bleeding after the initial episode.   History of CAD  Denies chest pain. Continue home medications.   Hypertension  Continue home medications except for HCTZ. Continue lisinopril to 20 mg. Follow up with PCP.   Hypothyroidism  Low TSH noted. Further titration of synthroid as outpatient as per her primary care physician.   Hypokalemia  Repleted orally.  Day of Discharge BP 128/72  Pulse 61  Temp 97.7 F (36.5 C) (Oral)  Resp 16  Ht 5\' 4"   (1.626 m)  Wt 80.3 kg (177 lb 0.5 oz)  BMI 30.39 kg/m2  SpO2 96%  Results for orders placed during the hospital encounter of 06/21/12 (from the past 48 hour(s))  BASIC METABOLIC PANEL     Status: Abnormal   Collection Time   06/24/12  6:35 AM      Component Value Range Comment   Sodium 128 (*) 135 - 145 mEq/L    Potassium 6.5 (*) 3.5 - 5.1 mEq/L    Chloride 95 (*) 96 - 112 mEq/L    CO2 24  19 - 32 mEq/L    Glucose, Bld 113 (*) 70 - 99 mg/dL    BUN 11  6 - 23 mg/dL    Creatinine, Ser 4.54  0.50 - 1.10 mg/dL    Calcium 8.7  8.4 - 09.8 mg/dL    GFR calc non Af Amer 65 (*) >90 mL/min    GFR calc Af Amer 76 (*) >90 mL/min   CBC     Status: Abnormal   Collection Time   06/24/12  6:35 AM      Component Value Range Comment   WBC 6.8  4.0 - 10.5 K/uL    RBC 3.76 (*) 3.87 - 5.11 MIL/uL    Hemoglobin 10.3 (*) 12.0 - 15.0 g/dL    HCT 11.9 (*) 14.7 - 46.0 %    MCV 84.3  78.0 - 100.0 fL    MCH 27.4  26.0 - 34.0 pg    MCHC 32.5  30.0 - 36.0 g/dL    RDW 82.9 (*) 56.2 - 15.5 %    Platelets 329  150 - 400 K/uL   BASIC METABOLIC PANEL     Status: Abnormal   Collection Time   06/24/12  8:44 AM      Component Value Range Comment   Sodium 128 (*) 135 - 145 mEq/L    Potassium 5.1  3.5 - 5.1 mEq/L    Chloride 93 (*) 96 - 112 mEq/L    CO2 24  19 - 32 mEq/L    Glucose, Bld 204 (*) 70 - 99 mg/dL    BUN 11  6 - 23 mg/dL    Creatinine, Ser 1.30  0.50 - 1.10 mg/dL    Calcium 8.8  8.4 - 86.5 mg/dL    GFR calc non Af Amer 66 (*) >90 mL/min    GFR calc Af Amer 77 (*) >90 mL/min   CBC     Status: Abnormal   Collection Time  06/25/12  5:00 AM      Component Value Range Comment   WBC 11.7 (*) 4.0 - 10.5 K/uL    RBC 3.84 (*) 3.87 - 5.11 MIL/uL    Hemoglobin 10.5 (*) 12.0 - 15.0 g/dL    HCT 16.1 (*) 09.6 - 46.0 %    MCV 84.6  78.0 - 100.0 fL    MCH 27.3  26.0 - 34.0 pg    MCHC 32.3  30.0 - 36.0 g/dL    RDW 04.5 (*) 40.9 - 15.5 %    Platelets 371  150 - 400 K/uL   BASIC METABOLIC PANEL     Status:  Abnormal   Collection Time   06/25/12  5:00 AM      Component Value Range Comment   Sodium 132 (*) 135 - 145 mEq/L    Potassium 4.0  3.5 - 5.1 mEq/L DELTA CHECK NOTED   Chloride 95 (*) 96 - 112 mEq/L    CO2 28  19 - 32 mEq/L    Glucose, Bld 113 (*) 70 - 99 mg/dL    BUN 12  6 - 23 mg/dL    Creatinine, Ser 8.11  0.50 - 1.10 mg/dL    Calcium 9.3  8.4 - 91.4 mg/dL    GFR calc non Af Amer 69 (*) >90 mL/min    GFR calc Af Amer 80 (*) >90 mL/min     Ct Angio Chest W/cm &/or Wo Cm  06/21/2012  *RADIOLOGY REPORT*  Clinical Data: Left lower extremity swelling post arthroscopic knee surgery.  Calf pain and redness.  Shortness of breath.  Lower extremity DVT.  CT ANGIOGRAPHY CHEST  Technique:  Multidetector CT imaging of the chest using the standard protocol during bolus administration of intravenous contrast. Multiplanar reconstructed images including MIPs were obtained and reviewed to evaluate the vascular anatomy.  Contrast: OMNIPAQUE IOHEXOL 350 MG/ML SOLN  Comparison: None.  Findings: Technically adequate study with good opacification of the central and segmental pulmonary arteries.  No focal filling defects are demonstrated.  No evidence of significant pulmonary embolus.  Mild prominence of heart size.  Scattered calcification of the coronary arteries.  Normal caliber thoracic aorta with mild calcification.  The esophagus is mildly gas distended suggesting dysmotility.  Dependent atelectasis in the lungs.  No focal airspace consolidation.  No significant interstitial infiltration. Airways appear patent.  No pleural effusion.  No pneumothorax. Degenerative changes in the thoracic spine.  Postoperative changes in the lower cervical spine.  IMPRESSION: No evidence of significant pulmonary embolus.  Original Report Authenticated By: Marlon Pel, M.D.     Disposition: Home with outpatient physical therapy  Diet: Heart healthy diet.  Activity: Resume as tolerated.   Follow-up  Appts:  Discharge Orders    Future Orders Please Complete By Expires   Diet - low sodium heart healthy      Increase activity slowly      Discharge instructions      Comments:   Followup OWENS,REBECCA, FNP (PCP) in 1 week. Please have BMET and CBC checked at next clinic visit.      TESTS THAT NEED FOLLOW-UP None.  Time spent on discharge, talking to the patient, and coordinating care: 35 mins.   Signed: Cristal Ford, MD 06/25/2012, 12:08 PM

## 2012-06-25 NOTE — Progress Notes (Signed)
Physical Therapy Treatment Patient Details Name: [name] [name] MRN: [id number] DOB: [birth year] Today's Date: 06/25/2012 Time: 4098-1191 PT Time Calculation (min): 28 min  PT Assessment / Plan / Recommendation Comments on Treatment Session  Pt with LLE DVT s/p knee arthoscopy who is able to progress to get left heel to the floor today, perform stair and increase ambulation distance. Will continue to follow to maximize independence and encourage daily ambulation with nursing with cueing for heel strike.     Follow Up Recommendations       Barriers to Discharge        Equipment Recommendations       Recommendations for Other Services    Frequency Min 3X/week   Plan Discharge plan remains appropriate;Frequency needs to be updated    Precautions / Restrictions Precautions Precautions: None   Pertinent Vitals/Pain No pain    Mobility  Bed Mobility Bed Mobility: Not assessed Transfers Transfers: Sit to Stand;Stand to Sit Sit to Stand: 6: Modified independent (Device/Increase time);From toilet Stand to Sit: 6: Modified independent (Device/Increase time);To chair/3-in-1 Ambulation/Gait Ambulation/Gait Assistance: 5: Supervision Ambulation Distance (Feet): 300 Feet Assistive device: Rolling walker Ambulation/Gait Assistance Details: Cueing for initial step to pattern to get LLE to floor and cueing to progress to step  through pattern with decreased dorsiflexion and speed.  Gait Pattern: Step-to pattern;Step-through pattern;Decreased dorsiflexion - left Gait velocity: decreased Stairs: Yes Stairs Assistance: 5: Supervision Stairs Assistance Details (indicate cue type and reason): cueing for sequence Stair Management Technique: One rail Left;Sideways Number of Stairs: 10     Exercises General Exercises - Lower Extremity Long Arc Quad: AROM;Left;15 reps;Seated Hip Flexion/Marching: AROM;Left;15 reps;Seated Toe Raises: AROM;Left;15 reps;Seated Heel Raises: AROM;Left;15 reps;Seated    PT Diagnosis:    PT Problem List:   PT Treatment Interventions:     PT Goals Acute Rehab PT Goals PT Goal: Ambulate - Progress: Progressing toward goal PT Goal: Up/Down Stairs - Progress: Met PT Goal: Perform Home Exercise Program - Progress: Progressing toward goal  Visit Information  Last PT Received On: 06/25/12 Assistance Needed: +1    Subjective Data  Subjective: I was doing well without the walker before   Cognition  Overall Cognitive Status: Appears within functional limits for tasks assessed/performed Arousal/Alertness: Awake/alert Orientation Level: Appears intact for tasks assessed Behavior During Session: [facility] for tasks performed    Balance     End of Session PT - End of Session Equipment Utilized During Treatment: Gait belt Activity Tolerance: Patient tolerated treatment well Patient left: in chair;with call bell/phone within reach   GP     Delorse Lek 06/25/2012, 9:47 AM Delaney Meigs, PT [phone]

## 2012-06-25 NOTE — Progress Notes (Signed)
Subjective: No specific complaints. Still having pain for DVT.  Objective: Vital signs in last 24 hours: Filed Vitals:   06/24/12 1342 06/24/12 2222 06/24/12 2226 06/25/12 0518  BP: 126/67  175/80 128/72  Pulse: 64 69 69 61  Temp: 97.6 F (36.4 C) 98.2 F (36.8 C) 98.2 F (36.8 C) 97.7 F (36.5 C)  TempSrc: Oral Oral Oral Oral  Resp: 20  20 16   Height:      Weight:      SpO2: 97%  97% 96%   Weight change:   Intake/Output Summary (Last 24 hours) at 06/25/12 1154 Last data filed at 06/25/12 0525  Gross per 24 hour  Intake    240 ml  Output    800 ml  Net   -560 ml    Physical Exam: General: Awake, Oriented, No acute distress. HEENT: EOMI. Neck: Supple CV: S1 and S2 Lungs: Clear to ascultation bilaterally Abdomen: Soft, Nontender, Nondistended, +bowel sounds. Ext: Good pulses.  Left lower extremity swollen, tender, not erythematous.  Lab Results: Basic Metabolic Panel:  Lab 06/25/12 1610 06/24/12 0844 06/24/12 0635 06/23/12 0505 06/22/12 0520  NA 132* 128* 128* 130* 128*  K 4.0 5.1 6.5* 4.8 3.2*  CL 95* 93* 95* 97 93*  CO2 28 24 24 27 26   GLUCOSE 113* 204* 113* 89 95  BUN 12 11 11 11 9   CREATININE 0.80 0.83 0.84 0.90 0.74  CALCIUM 9.3 8.8 8.7 8.5 8.6  MG -- -- -- -- 1.6  PHOS -- -- -- -- --   Liver Function Tests:  Lab 06/22/12 0520  AST 14  ALT 8  ALKPHOS 85  BILITOT 0.6  PROT 5.4*  ALBUMIN 2.9*   No results found for this basename: LIPASE:5,AMYLASE:5 in the last 168 hours No results found for this basename: AMMONIA:5 in the last 168 hours CBC:  Lab 06/25/12 0500 06/24/12 0635 06/22/12 0520 06/21/12 1701  WBC 11.7* 6.8 5.6 7.5  NEUTROABS -- -- -- 5.6  HGB 10.5* 10.3* 9.9* 10.5*  HCT 32.5* 31.7* 29.0* 31.3*  MCV 84.6 84.3 82.4 83.0  PLT 371 329 324 338   Cardiac Enzymes:  Lab 06/21/12 2258  CKTOTAL 55  CKMB 2.8  CKMBINDEX --  TROPONINI <0.30   BNP (last 3 results)  Basename 06/21/12 2258  PROBNP 1224.0*   CBG: No results found for  this basename: GLUCAP:5 in the last 168 hours No results found for this basename: HGBA1C:5 in the last 72 hours Other Labs: No components found with this basename: POCBNP:3 No results found for this basename: DDIMER:2 in the last 168 hours No results found for this basename: CHOL:2,HDL:2,LDLCALC:2,TRIG:2,CHOLHDL:2,LDLDIRECT:2 in the last 168 hours  Lab 06/21/12 2258  TSH 0.069*  T4TOTAL --  T3FREE --  FREET4 --  THYROIDAB --    Lab 06/21/12 2258  VITAMINB12 297  FOLATE >20.0  FERRITIN 65  TIBC 399  IRON 45  RETICCTPCT 1.8    Micro Results: No results found for this or any previous visit (from the past 240 hour(s)).  Studies/Results: No results found.  Medications: I have reviewed the patient's current medications. Scheduled Meds:    . atenolol  50 mg Oral Daily  . atorvastatin  40 mg Oral Daily  . docusate sodium  100 mg Oral Daily  . DULoxetine  30 mg Oral Daily  . HYDROcodone-acetaminophen  2 tablet Oral Once  . hydrocortisone  25 mg Rectal BID  . levothyroxine  125 mcg Oral Q0600  . lisinopril  20 mg  Oral Daily  . pantoprazole  40 mg Oral Q1200  . rivaroxaban  20 mg Oral Q supper  . rivaroxaban  15 mg Oral BID WC   Continuous Infusions:  PRN Meds:.acetaminophen, acetaminophen, HYDROcodone-acetaminophen, nitroGLYCERIN, ondansetron (ZOFRAN) IV, ondansetron  Assessment/Plan: DVT of the left lower extremity involving the left posterior tibial vein  Continue on Xarelto per pharmacy. Plan 15 mg bid x 3 weeks, then 20 mg daily. Monitor CBC. No bleeding reported. Her risk factor for DVT is the recent surgery and patient being on Premarin which has been discontinued.   Hyponatremia  Improved. Likely from HCTZ, which has been discontinued. Fluid restrict the patient to 1.5 L. Continue to monitor.   Anemia  Likely due to blood loss from recent procedure. Colonoscopy from 2011 c/w diverticulosis. She's due for next colonoscopy in 2016.   Minor bleeding from internal  hemorrhoids from new anticoagulant 3 days of Anusol PR then PRN if bleeding reoccurs. Appreciate GI input.  History of CAD  Denies chest pain. Continue home medications.   Hypertension  Continue home medications except for HCTZ. Decreased lisinopril to 10 mg. Follow up with PCP.   Hypothyroidism  Low TSH noted. Continue Synthroid but decrease dose to 112 mcg daily.   Hypokalemia  Repleted orally.  Dispo: Discharge the patient today if hemoglobin.    LOS: 4 days  Law Corsino A, MD 06/25/2012, 11:54 AM

## 2012-06-29 ENCOUNTER — Encounter (HOSPITAL_COMMUNITY): Payer: Self-pay | Admitting: *Deleted

## 2012-06-29 ENCOUNTER — Emergency Department (HOSPITAL_COMMUNITY)
Admission: EM | Admit: 2012-06-29 | Discharge: 2012-06-30 | Disposition: A | Discharge status: Home / Self Care | Payer: Medicare Other | Attending: Emergency Medicine | Admitting: Emergency Medicine

## 2012-06-29 DIAGNOSIS — E039 Hypothyroidism, unspecified: Secondary | ICD-10-CM | POA: Diagnosis not present

## 2012-06-29 DIAGNOSIS — D649 Anemia, unspecified: Secondary | ICD-10-CM | POA: Diagnosis not present

## 2012-06-29 DIAGNOSIS — F3289 Other specified depressive episodes: Secondary | ICD-10-CM | POA: Insufficient documentation

## 2012-06-29 DIAGNOSIS — K219 Gastro-esophageal reflux disease without esophagitis: Secondary | ICD-10-CM | POA: Diagnosis not present

## 2012-06-29 DIAGNOSIS — Z79899 Other long term (current) drug therapy: Secondary | ICD-10-CM | POA: Insufficient documentation

## 2012-06-29 DIAGNOSIS — L0291 Cutaneous abscess, unspecified: Secondary | ICD-10-CM | POA: Diagnosis not present

## 2012-06-29 DIAGNOSIS — F329 Major depressive disorder, single episode, unspecified: Secondary | ICD-10-CM | POA: Insufficient documentation

## 2012-06-29 DIAGNOSIS — M79609 Pain in unspecified limb: Secondary | ICD-10-CM | POA: Diagnosis not present

## 2012-06-29 DIAGNOSIS — I2510 Atherosclerotic heart disease of native coronary artery without angina pectoris: Secondary | ICD-10-CM | POA: Diagnosis not present

## 2012-06-29 DIAGNOSIS — F411 Generalized anxiety disorder: Secondary | ICD-10-CM | POA: Insufficient documentation

## 2012-06-29 DIAGNOSIS — I82409 Acute embolism and thrombosis of unspecified deep veins of unspecified lower extremity: Secondary | ICD-10-CM | POA: Insufficient documentation

## 2012-06-29 DIAGNOSIS — I10 Essential (primary) hypertension: Secondary | ICD-10-CM | POA: Insufficient documentation

## 2012-06-29 DIAGNOSIS — E785 Hyperlipidemia, unspecified: Secondary | ICD-10-CM | POA: Insufficient documentation

## 2012-06-29 DIAGNOSIS — E871 Hypo-osmolality and hyponatremia: Secondary | ICD-10-CM | POA: Diagnosis not present

## 2012-06-29 LAB — URINALYSIS, ROUTINE W REFLEX MICROSCOPIC
Leukocytes, UA: NEGATIVE
Nitrite: NEGATIVE
Specific Gravity, Urine: 1.008 (ref 1.005–1.030)
Urobilinogen, UA: 1 mg/dL (ref 0.0–1.0)
pH: 6.5 (ref 5.0–8.0)

## 2012-06-29 LAB — BASIC METABOLIC PANEL
CO2: 27 mEq/L (ref 19–32)
Calcium: 9 mg/dL (ref 8.4–10.5)
Creatinine, Ser: 0.81 mg/dL (ref 0.50–1.10)
GFR calc Af Amer: 79 mL/min — ABNORMAL LOW (ref >90)
Sodium: 132 mEq/L — ABNORMAL LOW (ref 135–145)

## 2012-06-29 LAB — CBC WITH DIFFERENTIAL/PLATELET
Basophils Relative: 1 % (ref 0–1)
Eosinophils Absolute: 0.2 10*3/uL (ref 0.0–0.7)
Hemoglobin: 9.8 g/dL — ABNORMAL LOW (ref 12.0–15.0)
Lymphs Abs: 1.5 10*3/uL (ref 0.7–4.0)
Monocytes Relative: 11 % (ref 3–12)
Neutro Abs: 4.6 10*3/uL (ref 1.7–7.7)
Neutrophils Relative %: 65 % (ref 43–77)
Platelets: 354 10*3/uL (ref 150–400)
RBC: 3.52 MIL/uL — ABNORMAL LOW (ref 3.87–5.11)

## 2012-06-29 LAB — PROTIME-INR
INR: 2.63 — ABNORMAL HIGH (ref 0.00–1.49)
Prothrombin Time: 28.5 seconds — ABNORMAL HIGH (ref 11.6–15.2)

## 2012-06-29 MED ORDER — HYDROCODONE-ACETAMINOPHEN 5-325 MG PO TABS
1.0000 | ORAL_TABLET | Freq: Once | ORAL | Status: AC
  Administered 2012-06-29: 1 via ORAL
  Filled 2012-06-29: qty 1

## 2012-06-29 MED ORDER — CEPHALEXIN 500 MG PO CAPS: 500.0000 mg | ORAL_CAPSULE | Freq: Four times a day (QID) | ORAL | Status: AC

## 2012-06-29 MED ORDER — OXYCODONE-ACETAMINOPHEN 5-325 MG PO TABS
1.0000 | ORAL_TABLET | Freq: Once | ORAL | Status: DC
  Filled 2012-06-29: qty 1

## 2012-06-29 MED ORDER — CEPHALEXIN 250 MG PO CAPS
500.0000 mg | ORAL_CAPSULE | Freq: Once | ORAL | Status: AC
  Administered 2012-06-29: 500 mg via ORAL
  Filled 2012-06-29: qty 2

## 2012-06-29 MED ORDER — HYDROCODONE-ACETAMINOPHEN 5-325 MG PO TABS: 2.0000 | ORAL_TABLET | ORAL | Status: AC | PRN

## 2012-06-29 NOTE — ED Notes (Signed)
The pt has been in the hospital for a lt leg dvt and discharged form this hospital on Thursday.  She was sent here by her regular doctor.  The lt leg redness has increased and that doctor thought the leg was infected

## 2012-06-29 NOTE — Discharge Instructions (Signed)
Deep Vein Thrombosis A deep vein thrombosis (DVT) is a blood clot (thrombus) that develops in a deep vein. A DVT is a clot in the deep, larger veins of the leg, arm, or pelvis. These are more dangerous than clots that might form in veins on the surface of the body. Deep vein thrombosis can lead to complications if the clot breaks off and travels in the bloodstream to the lungs. CAUSES Blood clots form in a vein for different reasons. Usually several things cause blood clots. They include:  The flow of blood slows down.   The inside of the vein is damaged in some way.   The person has a condition that makes blood clot more easily. These conditions may include:   Older age (especially over 75 years old).   Having a history of blood clots.   Having major or lengthy surgery. Hip surgery is particularly high-risk.   Breaking a hip or leg.   Sitting or lying still for a long time.   Cancer or cancer treatment.   Having a long, thin tube (catheter) placed inside a vein during a medical procedure.   Being overweight (obese).   Pregnancy and childbirth.   Medicines with estrogen.   Smoking.   Other circulation or heart problems.  SYMPTOMS When a clot forms, it can either partially or totally block the blood flow in that vein. Symptoms of a DVT can include:  Swelling of the leg or arm, especially if one side is much worse.   Warmth and redness of the leg or arm, especially if one side is much worse.   Pain in an arm or leg. If the clot is in the leg, symptoms may be more noticeable or worse when standing or walking.  If the blood clot travels to the lung, it may cause:  Shortness of breath.   Chest pain. The pain may be worsened by deep breaths.   Coughing up thick mucus (phlegm), possibly flecked with blood.  Anyone with these symptoms should get emergency medical treatment right away. Call your local emergency services (911 in [place] if you have these symptoms. DIAGNOSIS If  a DVT is suspected, your caregiver will take a full medical history. He or she will also perform a physical exam. Tests that also may be required include:  Studies of the clotting properties of the blood.   An ultrasound scan.   X-rays to show the flow of blood when special dye is injected into the veins (venography).   Studies of your lungs if you have any chest symptoms.  PREVENTION  Exercise the legs regularly. Take a brisk 30 minute walk every day.   Maintain a weight that is appropriate for your height.   Avoid sitting or lying in bed for long periods of time without moving your legs.   Women, particularly those over the age of 35, should consider the risks and benefits of taking estrogen medicines, including birth control pills.   Do not smoke, especially if you take estrogen medicines.   Long-distance travel can increase your risk. You should exercise your legs by walking or pumping the muscles every hour.   In hospital prevention:   Prevention may include medical and nonmedical measures.  TREATMENT  The most common treatment for DVT is blood thinning (anticoagulant) medicine, which reduces the blood's tendency to clot. Anticoagulants can stop new blood clots from forming and old ones from growing. They cannot dissolve existing clots. Your body does this by itself over time.   Anticoagulants can be given by mouth, by intravenous (IV) access, or by injection. Your caregiver will determine the best program for you.   Less commonly, clot-dissolving drugs (thrombolytics) are used to dissolve a DVT. They carry a high risk of bleeding, so they are used mainly in severe cases.   Very rarely, a blood clot in the leg needs to be removed surgically.   If you are unable to take anticoagulants, your caregiver may arrange for you to have a filter placed in a main vein in your belly (abdomen). This filter prevents clots from traveling to your lungs.  HOME CARE INSTRUCTIONS  Take all  medicines prescribed by your caregiver. Follow the directions carefully.   You will most likely continue taking anticoagulants after you leave the hospital. Your caregiver will advise you on the length of treatment (usually 3 to 6 months, sometimes for life).   Taking too much or too little of an anticoagulant is dangerous. While taking this type of medicine, you will need to have regular blood tests to be sure the dose is correct. The dose can change for many reasons. It is critically important that you take this medicine exactly as prescribed, and that you have blood tests exactly as directed.   Many foods can interfere with anticoagulants. These include foods high in vitamin K, such as spinach, kale, broccoli, cabbage, collard and turnip greens, Brussels sprouts, peas, cauliflower, seaweed, parsley, beef and pork liver, green tea, and soybean oil. Your caregiver should discuss limits on these foods with you or you should arrange a visit with a dietician to answer your questions.   Many medicines can interfere with anticoagulants. You must tell your caregiver about any and all medicines you take. This includes all vitamins and supplements. Be especially cautious with aspirin and anti-inflammatory medicines. Ask your caregiver before taking these.   Anticoagulants can have side effects, mostly excessive bruising or bleeding. You will need to hold pressure over cuts for longer than usual. Avoid alcoholic drinks or consume only very small amounts while taking this medicine.   If you are taking an anticoagulant:   Wear a medical alert bracelet.   Notify your dentist or other caregivers before procedures.   Avoid contact sports.   Ask your caregiver how soon you can go back to normal activities. Not being active can lead to new clots. Ask for a list of what you should and should not do.   Exercise your lower leg muscles. This is important while traveling.   You may need to wear compression  stockings. These are tight elastic stockings that apply pressure to the lower legs. This can help keep the blood in the legs from clotting.   If you are a smoker, you should quit.   Learn as much as you can about DVT.  SEEK MEDICAL CARE IF:  You have unusual bruising or any bleeding problems.   The swelling or pain in your affected arm or leg is not gradually improving.   You anticipate surgery or long-distance travel. You should get specific advice on DVT prevention.   You discover other family members with blood clots. This may require further testing for inherited diseases or conditions.  SEEK IMMEDIATE MEDICAL CARE IF:  You develop chest pain.   You develop severe shortness of breath.   You begin to cough up bloody mucus or phlegm (sputum).   You feel dizzy or faint.   You develop swelling or pain in the leg.   You have   breathing problems after traveling.  MAKE SURE YOU:  Understand these instructions.   Will watch your condition.   Will get help right away if you are not doing well or get worse.  Document Released: 12/16/2005 Document Revised: 12/05/2011 Document Reviewed: 02/07/2011 [employer] Patient Information 2012 [employer]. 

## 2012-06-29 NOTE — ED Notes (Signed)
The pt just had blood drawn in her doctors office before she came here

## 2012-06-29 NOTE — ED Provider Notes (Signed)
History     CSN: [id number]  Arrival date & time 06/29/12  1727   First MD Initiated Contact with Patient 06/29/12 2130      Chief Complaint  Patient presents with  . DVT    (Consider location/radiation/quality/duration/timing/severity/associated sxs/prior treatment) HPI Patient complaining of left lower extremity pain. She has a history of a DVT diagnosed 9 days ago. She was hospitalized and placed on Zaroxolyn. She has been home for 4 days. She was seen by the nurse practitioner in her primary care office today. She continued to complain of pain and she was sent to the emergency department for concerns regarding cellulitis. The patient denies any change in the skin from last week when she was discharged. She states the swelling had decreased somewhat last night. She states that she had 3 pain pills when she left the emergency department has not had anything for pain. She denies any fever or chills. Past Medical History  Diagnosis Date  . Coronary artery disease   . Hyperlipidemia   . Orthostatic hypotension   . Hypertension   . Osteoarthrosis, unspecified whether generalized or localized, unspecified site   . Family history of malignant neoplasm of gastrointestinal tract   . Allergic rhinitis, cause unspecified   . Unspecified functional disorder of intestine   . H/O: hysterectomy   . Osteoporosis   . Anxiety and depression   . GERD (gastroesophageal reflux disease)   . Hypothyroidism   . Chronic headaches     Past Surgical History  Procedure Date  . Cardiac catheterization   . Total knee arthroplasty   . Nasal sinus surgery   . Tonsillectomy   . Appendectomy   . Cyst removal on back   . Cervical spine surgery   . Knee surgery     Family History  Problem Relation Age of Onset  . Brain cancer Other     1st degree relative  . Heart disease Other   . Colon cancer Mother   . Heart attack Father 31  . Colon cancer      1st degree relative    History  Substance  Use Topics  . Smoking status: Never Smoker   . Smokeless tobacco: Not on file   Comment: was a passive smoker for 15 yrs  . Alcohol Use: No    OB History    Grav Para Term Preterm Abortions TAB SAB Ect Mult Living                  Review of Systems  All other systems reviewed and are negative.    Allergies  Aspirin; Prednisone; and Sulfonamide derivatives  Home Medications   Current Outpatient Rx  Name Route Sig Dispense Refill  . ATENOLOL 50 MG PO TABS Oral Take 50 mg by mouth daily.     . ATORVASTATIN CALCIUM 20 MG PO TABS Oral Take 40 mg by mouth daily.     . CELECOXIB 200 MG PO CAPS Oral Take 200 mg by mouth daily.      . DULOXETINE HCL 30 MG PO CPEP Oral Take 30 mg by mouth daily.     Marland Kitchen ESOMEPRAZOLE MAGNESIUM 40 MG PO CPDR Oral Take 40 mg by mouth daily.     Marland Kitchen HYDROCODONE-ACETAMINOPHEN 5-325 MG PO TABS Oral Take 1 tablet by mouth every 4 (four) hours as needed. For pain related to surgery    . LEVOTHYROXINE SODIUM 125 MCG PO TABS Oral Take 125 mcg by mouth daily.    Marland Kitchen  LISINOPRIL 20 MG PO TABS Oral Take 20 mg by mouth daily.    Marland Kitchen RIVAROXABAN 20 MG PO TABS Oral Take 1 tablet (20 mg total) by mouth 2 (two) times daily. 15 mg twice daily until 07/13/12 then 20 mg daily there after. 60 tablet 0    BP 127/68  Pulse 70  Temp 98.8 F (37.1 C) (Oral)  Resp 20  SpO2 100%  Physical Exam  Nursing note and vitals reviewed. Constitutional: She is oriented to person, place, and time. She appears well-developed and well-nourished.  HENT:  Head: Normocephalic and atraumatic.  Eyes: Conjunctivae are normal. Pupils are equal, round, and reactive to light.  Neck: Normal range of motion. Neck supple.  Cardiovascular: Normal rate, regular rhythm, normal heart sounds and intact distal pulses.   Pulmonary/Chest: Effort normal and breath sounds normal.  Abdominal: Soft. Bowel sounds are normal.  Musculoskeletal: Normal range of motion.       Left lower extremity with redness and  swelling. Dorsal pedalis pulses are 2+. Sensation is intact in the toes. She has full active range of motion of the ankle and knee. There is no erythema spreading up her thigh.  Neurological: She is alert and oriented to person, place, and time.  Skin: Skin is warm and dry.  Psychiatric: She has a normal mood and affect.    ED Course  Procedures (including critical care time)   Labs Reviewed  URINALYSIS, ROUTINE W REFLEX MICROSCOPIC  CBC WITH DIFFERENTIAL   No results found.   No diagnosis found.   Results for orders placed during the hospital encounter of 06/29/12  URINALYSIS, ROUTINE W REFLEX MICROSCOPIC      Component Value Range   Color, Urine YELLOW  YELLOW   APPearance CLEAR  CLEAR   Specific Gravity, Urine 1.008  1.005 - 1.030   pH 6.5  5.0 - 8.0   Glucose, UA NEGATIVE  NEGATIVE mg/dL   Hgb urine dipstick NEGATIVE  NEGATIVE   Bilirubin Urine NEGATIVE  NEGATIVE   Ketones, ur NEGATIVE  NEGATIVE mg/dL   Protein, ur NEGATIVE  NEGATIVE mg/dL   Urobilinogen, UA 1.0  0.0 - 1.0 mg/dL   Nitrite NEGATIVE  NEGATIVE   Leukocytes, UA NEGATIVE  NEGATIVE  CBC WITH DIFFERENTIAL      Component Value Range   WBC 7.1  4.0 - 10.5 K/uL   RBC 3.52 (*) 3.87 - 5.11 MIL/uL   Hemoglobin 9.8 (*) 12.0 - 15.0 g/dL   HCT 16.1 (*) 09.6 - 04.5 %   MCV 83.2  78.0 - 100.0 fL   MCH 27.8  26.0 - 34.0 pg   MCHC 33.4  30.0 - 36.0 g/dL   RDW 40.9 (*) 81.1 - 91.4 %   Platelets 354  150 - 400 K/uL   Neutrophils Relative 65  43 - 77 %   Neutro Abs 4.6  1.7 - 7.7 K/uL   Lymphocytes Relative 21  12 - 46 %   Lymphs Abs 1.5  0.7 - 4.0 K/uL   Monocytes Relative 11  3 - 12 %   Monocytes Absolute 0.8  0.1 - 1.0 K/uL   Eosinophils Relative 3  0 - 5 %   Eosinophils Absolute 0.2  0.0 - 0.7 K/uL   Basophils Relative 1  0 - 1 %   Basophils Absolute 0.0  0.0 - 0.1 K/uL  PROTIME-INR      Component Value Range   Prothrombin Time 28.5 (*) 11.6 - 15.2 seconds   INR 2.63 (*)  0.00 - 1.49  BASIC METABOLIC PANEL       Component Value Range   Sodium 132 (*) 135 - 145 mEq/L   Potassium 4.1  3.5 - 5.1 mEq/L   Chloride 94 (*) 96 - 112 mEq/L   CO2 27  19 - 32 mEq/L   Glucose, Bld 89  70 - 99 mg/dL   BUN 12  6 - 23 mg/dL   Creatinine, Ser 1.61  0.50 - 1.10 mg/dL   Calcium 9.0  8.4 - 09.6 mg/dL   GFR calc non Af Amer 68 (*) >90 mL/min   GFR calc Af Amer 79 (*) >90 mL/min    MDM  1-DVT patient with known DVT and is anticoagulated on to relative. Her INR is 2.63. Her symptoms appear consistent with her DVT. She is eyes regarding treatment of the DVT to have continued elevation and warm compresses. She'll be given prescription for Vicodin for pain.  2- anemia Her hemoglobin has decreased slightly from 10.2-9.8. Patient is advised. Patient denies any dark tarry stool or blood in her stool. She is advised have close followup regarding this.  3-patient was concerned that she may have cellulitis. I think this is low probability. She does not have a fever an elevated white blood cell count. She was placed on Keflex and is advised to have her lites rechecked by her primary care Dr. tomorrow.     Hilario Quarry, MD 06/29/12 [phone]

## 2012-06-30 DIAGNOSIS — I82409 Acute embolism and thrombosis of unspecified deep veins of unspecified lower extremity: Secondary | ICD-10-CM | POA: Diagnosis not present

## 2012-06-30 DIAGNOSIS — E871 Hypo-osmolality and hyponatremia: Secondary | ICD-10-CM | POA: Diagnosis not present

## 2012-06-30 DIAGNOSIS — R609 Edema, unspecified: Secondary | ICD-10-CM | POA: Diagnosis not present

## 2012-06-30 DIAGNOSIS — L0291 Cutaneous abscess, unspecified: Secondary | ICD-10-CM | POA: Diagnosis not present

## 2012-06-30 DIAGNOSIS — D649 Anemia, unspecified: Secondary | ICD-10-CM | POA: Diagnosis not present

## 2012-06-30 DIAGNOSIS — L0390 Cellulitis, unspecified: Secondary | ICD-10-CM | POA: Diagnosis not present

## 2012-07-01 DIAGNOSIS — L0390 Cellulitis, unspecified: Secondary | ICD-10-CM | POA: Diagnosis not present

## 2012-07-13 DIAGNOSIS — L03119 Cellulitis of unspecified part of limb: Secondary | ICD-10-CM | POA: Diagnosis not present

## 2012-07-13 DIAGNOSIS — L02419 Cutaneous abscess of limb, unspecified: Secondary | ICD-10-CM | POA: Diagnosis not present

## 2012-07-13 DIAGNOSIS — I82409 Acute embolism and thrombosis of unspecified deep veins of unspecified lower extremity: Secondary | ICD-10-CM | POA: Diagnosis not present

## 2012-07-16 DIAGNOSIS — E039 Hypothyroidism, unspecified: Secondary | ICD-10-CM | POA: Diagnosis not present

## 2012-07-16 DIAGNOSIS — I82409 Acute embolism and thrombosis of unspecified deep veins of unspecified lower extremity: Secondary | ICD-10-CM | POA: Diagnosis not present

## 2012-07-16 DIAGNOSIS — L03119 Cellulitis of unspecified part of limb: Secondary | ICD-10-CM | POA: Diagnosis not present

## 2012-07-16 DIAGNOSIS — D649 Anemia, unspecified: Secondary | ICD-10-CM | POA: Diagnosis not present

## 2012-07-16 DIAGNOSIS — I10 Essential (primary) hypertension: Secondary | ICD-10-CM | POA: Diagnosis not present

## 2012-07-16 DIAGNOSIS — L02419 Cutaneous abscess of limb, unspecified: Secondary | ICD-10-CM | POA: Diagnosis not present

## 2012-07-16 DIAGNOSIS — E559 Vitamin D deficiency, unspecified: Secondary | ICD-10-CM | POA: Diagnosis not present

## 2012-07-27 DIAGNOSIS — I82409 Acute embolism and thrombosis of unspecified deep veins of unspecified lower extremity: Secondary | ICD-10-CM | POA: Diagnosis not present

## 2012-07-27 DIAGNOSIS — E039 Hypothyroidism, unspecified: Secondary | ICD-10-CM | POA: Diagnosis not present

## 2012-07-27 DIAGNOSIS — D649 Anemia, unspecified: Secondary | ICD-10-CM | POA: Diagnosis not present

## 2012-07-27 DIAGNOSIS — L03119 Cellulitis of unspecified part of limb: Secondary | ICD-10-CM | POA: Diagnosis not present

## 2012-07-27 DIAGNOSIS — L02419 Cutaneous abscess of limb, unspecified: Secondary | ICD-10-CM | POA: Diagnosis not present

## 2012-08-20 DIAGNOSIS — M25569 Pain in unspecified knee: Secondary | ICD-10-CM | POA: Diagnosis not present

## 2012-08-20 DIAGNOSIS — R269 Unspecified abnormalities of gait and mobility: Secondary | ICD-10-CM | POA: Diagnosis not present

## 2012-08-24 DIAGNOSIS — R269 Unspecified abnormalities of gait and mobility: Secondary | ICD-10-CM | POA: Diagnosis not present

## 2012-08-24 DIAGNOSIS — M25569 Pain in unspecified knee: Secondary | ICD-10-CM | POA: Diagnosis not present

## 2012-08-27 DIAGNOSIS — R269 Unspecified abnormalities of gait and mobility: Secondary | ICD-10-CM | POA: Diagnosis not present

## 2012-08-27 DIAGNOSIS — M25569 Pain in unspecified knee: Secondary | ICD-10-CM | POA: Diagnosis not present

## 2012-08-27 DIAGNOSIS — M25539 Pain in unspecified wrist: Secondary | ICD-10-CM | POA: Diagnosis not present

## 2012-09-01 DIAGNOSIS — M25569 Pain in unspecified knee: Secondary | ICD-10-CM | POA: Diagnosis not present

## 2012-09-01 DIAGNOSIS — M25539 Pain in unspecified wrist: Secondary | ICD-10-CM | POA: Diagnosis not present

## 2012-09-01 DIAGNOSIS — R269 Unspecified abnormalities of gait and mobility: Secondary | ICD-10-CM | POA: Diagnosis not present

## 2012-09-03 DIAGNOSIS — M25569 Pain in unspecified knee: Secondary | ICD-10-CM | POA: Diagnosis not present

## 2012-09-03 DIAGNOSIS — R269 Unspecified abnormalities of gait and mobility: Secondary | ICD-10-CM | POA: Diagnosis not present

## 2012-09-04 DIAGNOSIS — H04129 Dry eye syndrome of unspecified lacrimal gland: Secondary | ICD-10-CM | POA: Diagnosis not present

## 2012-09-04 DIAGNOSIS — H10409 Unspecified chronic conjunctivitis, unspecified eye: Secondary | ICD-10-CM | POA: Diagnosis not present

## 2012-09-04 DIAGNOSIS — Z961 Presence of intraocular lens: Secondary | ICD-10-CM | POA: Diagnosis not present

## 2012-09-04 DIAGNOSIS — H26499 Other secondary cataract, unspecified eye: Secondary | ICD-10-CM | POA: Diagnosis not present

## 2012-09-09 ENCOUNTER — Other Ambulatory Visit: Payer: Self-pay | Admitting: Family Medicine

## 2012-09-09 DIAGNOSIS — M79605 Pain in left leg: Secondary | ICD-10-CM

## 2012-09-09 DIAGNOSIS — M25569 Pain in unspecified knee: Secondary | ICD-10-CM | POA: Diagnosis not present

## 2012-09-12 ENCOUNTER — Ambulatory Visit
Admission: RE | Admit: 2012-09-12 | Discharge: 2012-09-12 | Disposition: A | Discharge status: Home / Self Care | Payer: Medicare Other | Source: Ambulatory Visit | Attending: Family Medicine | Admitting: Family Medicine

## 2012-09-12 DIAGNOSIS — IMO0002 Reserved for concepts with insufficient information to code with codable children: Secondary | ICD-10-CM | POA: Diagnosis not present

## 2012-09-12 DIAGNOSIS — M79605 Pain in left leg: Secondary | ICD-10-CM

## 2012-09-17 DIAGNOSIS — M1710 Unilateral primary osteoarthritis, unspecified knee: Secondary | ICD-10-CM | POA: Diagnosis not present

## 2012-10-13 DIAGNOSIS — D235 Other benign neoplasm of skin of trunk: Secondary | ICD-10-CM | POA: Diagnosis not present

## 2012-10-13 DIAGNOSIS — L820 Inflamed seborrheic keratosis: Secondary | ICD-10-CM | POA: Diagnosis not present

## 2012-10-14 DIAGNOSIS — M545 Low back pain, unspecified: Secondary | ICD-10-CM | POA: Diagnosis not present

## 2012-10-14 DIAGNOSIS — F411 Generalized anxiety disorder: Secondary | ICD-10-CM | POA: Diagnosis not present

## 2012-10-14 DIAGNOSIS — Z23 Encounter for immunization: Secondary | ICD-10-CM | POA: Diagnosis not present

## 2012-10-14 DIAGNOSIS — I82409 Acute embolism and thrombosis of unspecified deep veins of unspecified lower extremity: Secondary | ICD-10-CM | POA: Diagnosis not present

## 2012-10-14 DIAGNOSIS — E039 Hypothyroidism, unspecified: Secondary | ICD-10-CM | POA: Diagnosis not present

## 2012-10-14 DIAGNOSIS — N390 Urinary tract infection, site not specified: Secondary | ICD-10-CM | POA: Diagnosis not present

## 2012-10-29 DIAGNOSIS — E039 Hypothyroidism, unspecified: Secondary | ICD-10-CM | POA: Diagnosis not present

## 2012-10-29 DIAGNOSIS — D649 Anemia, unspecified: Secondary | ICD-10-CM | POA: Diagnosis not present

## 2012-11-04 DIAGNOSIS — F3289 Other specified depressive episodes: Secondary | ICD-10-CM | POA: Diagnosis not present

## 2012-11-04 DIAGNOSIS — F329 Major depressive disorder, single episode, unspecified: Secondary | ICD-10-CM | POA: Diagnosis not present

## 2012-11-04 DIAGNOSIS — I10 Essential (primary) hypertension: Secondary | ICD-10-CM | POA: Diagnosis not present

## 2012-11-04 DIAGNOSIS — E039 Hypothyroidism, unspecified: Secondary | ICD-10-CM | POA: Diagnosis not present

## 2012-11-06 ENCOUNTER — Telehealth: Payer: Self-pay | Admitting: Cardiology

## 2012-11-06 NOTE — Telephone Encounter (Addendum)
Pt BP today 177/96 am, 192-102 about 11a, pls advise [phone]  fyi pt has blood clot in leg

## 2012-11-06 NOTE — Telephone Encounter (Signed)
Patient states was in the PCP's office yesterday and her PB  was up 170/80 . The PCP recommended for pt to keep a record of BP. Today's [place] is 17796 to185/98. Patient states has been eating ham sandwishes for the last 3 days Pt was made aware that procees meat has lots of salt specially  ham. Pt was in the hospital and treated for al blood clot in leg. Some BP medications have been D/C when pt was in the hospital. Pt states  will omit salt in her diet to see if her BP goes down. Pt will call monday it BP continues to be elevated.

## 2012-12-02 DIAGNOSIS — Z79899 Other long term (current) drug therapy: Secondary | ICD-10-CM | POA: Diagnosis not present

## 2012-12-03 DIAGNOSIS — M1710 Unilateral primary osteoarthritis, unspecified knee: Secondary | ICD-10-CM | POA: Diagnosis not present

## 2012-12-10 DIAGNOSIS — M1710 Unilateral primary osteoarthritis, unspecified knee: Secondary | ICD-10-CM | POA: Diagnosis not present

## 2012-12-17 DIAGNOSIS — M1710 Unilateral primary osteoarthritis, unspecified knee: Secondary | ICD-10-CM | POA: Diagnosis not present

## 2013-01-05 ENCOUNTER — Other Ambulatory Visit: Payer: Self-pay | Admitting: Family Medicine

## 2013-01-05 DIAGNOSIS — I82409 Acute embolism and thrombosis of unspecified deep veins of unspecified lower extremity: Secondary | ICD-10-CM

## 2013-01-05 DIAGNOSIS — R52 Pain, unspecified: Secondary | ICD-10-CM

## 2013-01-05 DIAGNOSIS — R609 Edema, unspecified: Secondary | ICD-10-CM

## 2013-01-07 ENCOUNTER — Ambulatory Visit
Admission: RE | Admit: 2013-01-07 | Discharge: 2013-01-07 | Disposition: A | Discharge status: Home / Self Care | Payer: Medicare Other | Source: Ambulatory Visit | Attending: Family Medicine | Admitting: Family Medicine

## 2013-01-07 DIAGNOSIS — M79609 Pain in unspecified limb: Secondary | ICD-10-CM | POA: Diagnosis not present

## 2013-01-07 DIAGNOSIS — R52 Pain, unspecified: Secondary | ICD-10-CM

## 2013-01-07 DIAGNOSIS — M7989 Other specified soft tissue disorders: Secondary | ICD-10-CM | POA: Diagnosis not present

## 2013-01-07 DIAGNOSIS — I82409 Acute embolism and thrombosis of unspecified deep veins of unspecified lower extremity: Secondary | ICD-10-CM

## 2013-01-07 DIAGNOSIS — R609 Edema, unspecified: Secondary | ICD-10-CM

## 2013-01-13 DIAGNOSIS — R300 Dysuria: Secondary | ICD-10-CM | POA: Diagnosis not present

## 2013-01-13 DIAGNOSIS — N390 Urinary tract infection, site not specified: Secondary | ICD-10-CM | POA: Diagnosis not present

## 2013-01-19 DIAGNOSIS — I10 Essential (primary) hypertension: Secondary | ICD-10-CM | POA: Diagnosis not present

## 2013-01-19 DIAGNOSIS — I82409 Acute embolism and thrombosis of unspecified deep veins of unspecified lower extremity: Secondary | ICD-10-CM | POA: Diagnosis not present

## 2013-02-05 ENCOUNTER — Other Ambulatory Visit: Payer: Self-pay | Admitting: Family Medicine

## 2013-02-05 DIAGNOSIS — Z1231 Encounter for screening mammogram for malignant neoplasm of breast: Secondary | ICD-10-CM

## 2013-02-09 DIAGNOSIS — M25569 Pain in unspecified knee: Secondary | ICD-10-CM | POA: Diagnosis not present

## 2013-02-16 DIAGNOSIS — I10 Essential (primary) hypertension: Secondary | ICD-10-CM | POA: Diagnosis not present

## 2013-03-05 ENCOUNTER — Ambulatory Visit: Payer: Medicare Other

## 2013-03-19 DIAGNOSIS — L570 Actinic keratosis: Secondary | ICD-10-CM | POA: Diagnosis not present

## 2013-03-19 DIAGNOSIS — D235 Other benign neoplasm of skin of trunk: Secondary | ICD-10-CM | POA: Diagnosis not present

## 2013-03-29 ENCOUNTER — Ambulatory Visit
Admission: RE | Admit: 2013-03-29 | Discharge: 2013-03-29 | Disposition: A | Discharge status: Home / Self Care | Payer: Medicare Other | Source: Ambulatory Visit | Attending: Family Medicine | Admitting: Family Medicine

## 2013-03-29 DIAGNOSIS — Z1231 Encounter for screening mammogram for malignant neoplasm of breast: Secondary | ICD-10-CM

## 2013-04-21 DIAGNOSIS — M545 Low back pain, unspecified: Secondary | ICD-10-CM | POA: Diagnosis not present

## 2013-04-21 DIAGNOSIS — R42 Dizziness and giddiness: Secondary | ICD-10-CM | POA: Diagnosis not present

## 2013-04-21 DIAGNOSIS — E039 Hypothyroidism, unspecified: Secondary | ICD-10-CM | POA: Diagnosis not present

## 2013-04-21 DIAGNOSIS — Z79899 Other long term (current) drug therapy: Secondary | ICD-10-CM | POA: Diagnosis not present

## 2013-04-21 DIAGNOSIS — M810 Age-related osteoporosis without current pathological fracture: Secondary | ICD-10-CM | POA: Diagnosis not present

## 2013-04-21 DIAGNOSIS — M2550 Pain in unspecified joint: Secondary | ICD-10-CM | POA: Diagnosis not present

## 2013-04-21 DIAGNOSIS — I10 Essential (primary) hypertension: Secondary | ICD-10-CM | POA: Diagnosis not present

## 2013-04-21 DIAGNOSIS — T148 Other injury of unspecified body region: Secondary | ICD-10-CM | POA: Diagnosis not present

## 2013-04-30 DIAGNOSIS — M542 Cervicalgia: Secondary | ICD-10-CM | POA: Diagnosis not present

## 2013-04-30 DIAGNOSIS — M25549 Pain in joints of unspecified hand: Secondary | ICD-10-CM | POA: Diagnosis not present

## 2013-05-19 DIAGNOSIS — S139XXA Sprain of joints and ligaments of unspecified parts of neck, initial encounter: Secondary | ICD-10-CM | POA: Diagnosis not present

## 2013-05-26 ENCOUNTER — Ambulatory Visit: Payer: Medicare Other | Admitting: Nurse Practitioner

## 2013-06-23 DIAGNOSIS — IMO0002 Reserved for concepts with insufficient information to code with codable children: Secondary | ICD-10-CM | POA: Diagnosis not present

## 2013-06-23 DIAGNOSIS — E039 Hypothyroidism, unspecified: Secondary | ICD-10-CM | POA: Diagnosis not present

## 2013-06-23 DIAGNOSIS — I10 Essential (primary) hypertension: Secondary | ICD-10-CM | POA: Diagnosis not present

## 2013-06-23 DIAGNOSIS — M1710 Unilateral primary osteoarthritis, unspecified knee: Secondary | ICD-10-CM | POA: Diagnosis not present

## 2013-06-29 ENCOUNTER — Ambulatory Visit: Payer: Medicare Other | Admitting: Nurse Practitioner

## 2013-06-30 ENCOUNTER — Ambulatory Visit (INDEPENDENT_AMBULATORY_CARE_PROVIDER_SITE_OTHER): Payer: Medicare Other | Admitting: Nurse Practitioner

## 2013-06-30 ENCOUNTER — Encounter: Payer: Self-pay | Admitting: Nurse Practitioner

## 2013-06-30 VITALS — BP 150/84 | HR 54 | Ht 65.0 in | Wt 159.4 lb

## 2013-06-30 DIAGNOSIS — I2510 Atherosclerotic heart disease of native coronary artery without angina pectoris: Secondary | ICD-10-CM

## 2013-06-30 DIAGNOSIS — I10 Essential (primary) hypertension: Secondary | ICD-10-CM | POA: Diagnosis not present

## 2013-06-30 DIAGNOSIS — R079 Chest pain, unspecified: Secondary | ICD-10-CM

## 2013-06-30 DIAGNOSIS — E785 Hyperlipidemia, unspecified: Secondary | ICD-10-CM

## 2013-06-30 NOTE — Progress Notes (Signed)
[name] [name] Date of Birth: 1933-09-01 Medical Record #[id number]  History of Present Illness: [name] [name] is seen back today for a 1 year check. Seen for Dr. Daleen Squibb. She has CAD and HTN. Her HTN has been difficult to treat. Other issues include OA, GERD, HLD, anxiety and depression and hypothyroidism.   She was last seen in May of 2013.   Most recently has seen her PCP. BP was up. May be considering knee replacement in the future with Dr. August Saucer.   She comes in today. She is here alone. She is considering knee replacement but needs to get her BP under control. She has lost weight since her last visit. She says she has been trying. For the most part, she feels good. No chest pain. Not short of breath. No syncope or dizziness reported. Not fasting today. Her BP diary is reviewed. Her readings are really ok for the most part. She will have some readings above 150 but the majority are less than 140 and some even down to 110's. She does not use salt. Tolerating her medicines. Her cuff has been checked and she says it may actually be a little higher.   Current Outpatient Prescriptions  Medication Sig Dispense Refill  . alendronate (FOSAMAX) 70 MG tablet Take 70 mg by mouth every 7 (seven) days. Take with a full glass of water on an empty stomach.      Marland Kitchen atenolol (TENORMIN) 50 MG tablet Take 50 mg by mouth 2 (two) times daily.       Marland Kitchen atorvastatin (LIPITOR) 20 MG tablet Take 20 mg by mouth daily.       . cyanocobalamin 2000 MCG tablet Take 2,000 mcg by mouth daily.      Marland Kitchen docusate sodium (COLACE) 100 MG capsule Take 100 mg by mouth daily.      . DULoxetine (CYMBALTA) 30 MG capsule Take 30 mg by mouth daily.       Marland Kitchen esomeprazole (NEXIUM) 40 MG capsule Take 40 mg by mouth daily.       . furosemide (LASIX) 20 MG tablet Take 20 mg by mouth daily.      . Glucosamine-Vitamin D (GLUCOSAMINE PLUS VITAMIN D) 1500-200 MG-UNIT TABS Take by mouth.      . valsartan (DIOVAN) 80 MG tablet Take 80 mg by mouth 2 (two)  times daily.      . verapamil (COVERA HS) 180 MG (CO) 24 hr tablet Take 180 mg by mouth at bedtime.      . vitamin C (ASCORBIC ACID) 500 MG tablet Take 500 mg by mouth daily.       No current facility-administered medications for this visit.    Allergies  Allergen Reactions  . Aspirin Other (See Comments)    Baby aspirin is tolerated, itching in the past  . Percocet (Oxycodone-Acetaminophen)   . Prednisone Other (See Comments)    Heart palpitations  . Sulfonamide Derivatives Other (See Comments)    Unknown reaction a long time ago    Past Medical History  Diagnosis Date  . Coronary artery disease   . Hyperlipidemia   . Orthostatic hypotension   . Hypertension   . Osteoarthrosis, unspecified whether generalized or localized, unspecified site   . Family history of malignant neoplasm of gastrointestinal tract   . Allergic rhinitis, cause unspecified   . Unspecified functional disorder of intestine   . H/O: hysterectomy   . Osteoporosis   . Anxiety and depression   . GERD (gastroesophageal reflux disease)   .  Hypothyroidism   . Chronic headaches     Past Surgical History  Procedure Laterality Date  . Cardiac catheterization    . Total knee arthroplasty    . Nasal sinus surgery    . Tonsillectomy    . Appendectomy    . Cyst removal on back    . Cervical spine surgery    . Knee surgery      History  Smoking status  . Never Smoker   Smokeless tobacco  . Not on file    Comment: was a passive smoker for 15 yrs    History  Alcohol Use No    Family History  Problem Relation Age of Onset  . Brain cancer Other     1st degree relative  . Heart disease Other   . Colon cancer Mother   . Heart attack Father 19  . Colon cancer      1st degree relative    Review of Systems: The review of systems is per the HPI.  All other systems were reviewed and are negative.  Physical Exam: BP 150/84  Pulse 54  Ht 5\' 5"  (1.651 m)  Wt 159 lb 6.4 oz (72.303 kg)  BMI 26.53  kg/m2 Her weight is down 24 pounds since her last visit her last May.  BP by me is 130/80. Patient is very pleasant and in no acute distress. Skin is warm and dry. Color is normal.  HEENT is unremarkable. Normocephalic/atraumatic. PERRL. Sclera are nonicteric. Neck is supple. No masses. No JVD. Lungs are clear. Cardiac exam shows a regular rate and rhythm. Abdomen is soft. Extremities are without edema. Gait and ROM are intact. No gross neurologic deficits noted.  LABORATORY DATA:  Lab Results  Component Value Date   WBC 7.1 06/29/2012   HGB 9.8* 06/29/2012   HCT 29.3* 06/29/2012   PLT 354 06/29/2012   GLUCOSE 89 06/29/2012   CHOL 196 12/26/2006   HDL 60.3 12/26/2006   LDLCALC 110* 12/26/2006   ALT 8 06/22/2012   AST 14 06/22/2012   NA 132* 06/29/2012   K 4.1 06/29/2012   CL 94* 06/29/2012   CREATININE 0.81 06/29/2012   BUN 12 06/29/2012   CO2 27 06/29/2012   TSH 0.069* 06/21/2012   INR 2.63* 06/29/2012   CARDIAC CATH IMPRESSION FROM MAY 2012  1. Mild-to-moderate nonobstructive coronary artery disease.  2. Normal left ventricular systolic function.   RECOMMENDATIONS: I do not see any lesions that would be causing chest  pain and dyspnea in this patient. I would recommend continued medical  management.  Verne Carrow, MD   CM/MEDQ D: 05/06/2011 T: 05/07/2011 Job: [id number]  cc: Thomas C. Wall, MD, [facility]   Assessment / Plan:  1. HTN - I think that she has fair BP control. I would leave her on her current regimen. She does a good job of monitoring at home.   2. CAD - last cath in May of 2012 with mild to moderate nonobstructive CAD - managed medically. No symptoms. I think she is an acceptable candidate for knee surgery. We will be available as needed.   3. OA - which limits how mobile she can be.  I will see her back in a year. Check fasting labs next week.   Patient is agreeable to this plan and will call if any problems develop in the interim.   Rosalio Macadamia, RN, ANP-C [facility] [address] [place], [place]  [address]

## 2013-06-30 NOTE — Patient Instructions (Addendum)
Stay on your current medicines - I would not add anymore medicine at this time.  I will send Dr. August Saucer a note  I think you are an acceptable candidate to have your knee surgery   We will check fasting labs next week  We will see you back in a year  Call the [facility] office at [phone] if you have any questions, problems or concerns.

## 2013-07-08 ENCOUNTER — Other Ambulatory Visit (INDEPENDENT_AMBULATORY_CARE_PROVIDER_SITE_OTHER): Payer: Medicare Other

## 2013-07-08 DIAGNOSIS — I10 Essential (primary) hypertension: Secondary | ICD-10-CM | POA: Diagnosis not present

## 2013-07-08 DIAGNOSIS — I2510 Atherosclerotic heart disease of native coronary artery without angina pectoris: Secondary | ICD-10-CM

## 2013-07-08 DIAGNOSIS — E785 Hyperlipidemia, unspecified: Secondary | ICD-10-CM

## 2013-07-08 DIAGNOSIS — R079 Chest pain, unspecified: Secondary | ICD-10-CM | POA: Diagnosis not present

## 2013-07-08 LAB — CBC WITH DIFFERENTIAL/PLATELET
Basophils Absolute: 0 10*3/uL (ref 0.0–0.1)
Basophils Relative: 0.7 % (ref 0.0–3.0)
Eosinophils Absolute: 0.2 10*3/uL (ref 0.0–0.7)
Eosinophils Relative: 2.2 % (ref 0.0–5.0)
HCT: 37.1 % (ref 36.0–46.0)
Hemoglobin: 12.4 g/dL (ref 12.0–15.0)
Lymphocytes Relative: 24.8 % (ref 12.0–46.0)
Lymphs Abs: 1.7 10*3/uL (ref 0.7–4.0)
MCHC: 33.5 g/dL (ref 30.0–36.0)
MCV: 91.1 fl (ref 78.0–100.0)
Monocytes Absolute: 0.6 10*3/uL (ref 0.1–1.0)
Monocytes Relative: 9 % (ref 3.0–12.0)
Neutro Abs: 4.4 10*3/uL (ref 1.4–7.7)
Neutrophils Relative %: 63.3 % (ref 43.0–77.0)
Platelets: 248 10*3/uL (ref 150.0–400.0)
RBC: 4.07 Mil/uL (ref 3.87–5.11)
RDW: 15.1 % — ABNORMAL HIGH (ref 11.5–14.6)
WBC: 7 10*3/uL (ref 4.5–10.5)

## 2013-07-08 LAB — HEPATIC FUNCTION PANEL
ALT: 14 U/L (ref 0–35)
AST: 21 U/L (ref 0–37)
Albumin: 3.9 g/dL (ref 3.5–5.2)
Alkaline Phosphatase: 83 U/L (ref 39–117)
Bilirubin, Direct: 0.1 mg/dL (ref 0.0–0.3)
Total Bilirubin: 0.8 mg/dL (ref 0.3–1.2)
Total Protein: 6.5 g/dL (ref 6.0–8.3)

## 2013-07-08 LAB — LIPID PANEL
Cholesterol: 163 mg/dL (ref 0–200)
HDL: 67.2 mg/dL
LDL Cholesterol: 84 mg/dL (ref 0–99)
Total CHOL/HDL Ratio: 2
Triglycerides: 61 mg/dL (ref 0.0–149.0)
VLDL: 12.2 mg/dL (ref 0.0–40.0)

## 2013-07-08 LAB — BASIC METABOLIC PANEL
BUN: 13 mg/dL (ref 6–23)
CO2: 35 mEq/L — ABNORMAL HIGH (ref 19–32)
Calcium: 8.7 mg/dL (ref 8.4–10.5)
Chloride: 96 mEq/L (ref 96–112)
Creatinine, Ser: 0.8 mg/dL (ref 0.4–1.2)
GFR: 73.63 mL/min (ref >60.00)
Glucose, Bld: 84 mg/dL (ref 70–99)
Potassium: 3.9 mEq/L (ref 3.5–5.1)
Sodium: 130 mEq/L — ABNORMAL LOW (ref 135–145)

## 2013-07-09 ENCOUNTER — Telehealth: Payer: Self-pay

## 2013-07-09 DIAGNOSIS — I10 Essential (primary) hypertension: Secondary | ICD-10-CM

## 2013-07-09 NOTE — Telephone Encounter (Signed)
Patient called lab results given.Advised to decrease fluid intake.Repeat bmet in 2 weeks.

## 2013-07-23 ENCOUNTER — Other Ambulatory Visit (INDEPENDENT_AMBULATORY_CARE_PROVIDER_SITE_OTHER): Payer: Medicare Other

## 2013-07-23 DIAGNOSIS — I10 Essential (primary) hypertension: Secondary | ICD-10-CM

## 2013-07-23 LAB — BASIC METABOLIC PANEL
BUN: 13 mg/dL (ref 6–23)
CO2: 30 mEq/L (ref 19–32)
Calcium: 8.9 mg/dL (ref 8.4–10.5)
Chloride: 99 mEq/L (ref 96–112)
Creatinine, Ser: 0.8 mg/dL (ref 0.4–1.2)
GFR: 74.7 mL/min (ref >60.00)
Glucose, Bld: 90 mg/dL (ref 70–99)
Potassium: 4 mEq/L (ref 3.5–5.1)
Sodium: 135 mEq/L (ref 135–145)

## 2013-08-18 DIAGNOSIS — M25569 Pain in unspecified knee: Secondary | ICD-10-CM | POA: Diagnosis not present

## 2013-08-18 DIAGNOSIS — M25549 Pain in joints of unspecified hand: Secondary | ICD-10-CM | POA: Diagnosis not present

## 2013-08-18 DIAGNOSIS — M542 Cervicalgia: Secondary | ICD-10-CM | POA: Diagnosis not present

## 2013-08-24 DIAGNOSIS — I10 Essential (primary) hypertension: Secondary | ICD-10-CM | POA: Diagnosis not present

## 2013-08-24 DIAGNOSIS — M159 Polyosteoarthritis, unspecified: Secondary | ICD-10-CM | POA: Diagnosis not present

## 2013-08-24 DIAGNOSIS — E039 Hypothyroidism, unspecified: Secondary | ICD-10-CM | POA: Diagnosis not present

## 2013-08-24 DIAGNOSIS — Z23 Encounter for immunization: Secondary | ICD-10-CM | POA: Diagnosis not present

## 2013-08-24 DIAGNOSIS — Z79899 Other long term (current) drug therapy: Secondary | ICD-10-CM | POA: Diagnosis not present

## 2013-08-24 DIAGNOSIS — E559 Vitamin D deficiency, unspecified: Secondary | ICD-10-CM | POA: Diagnosis not present

## 2013-10-13 DIAGNOSIS — Z0000 Encounter for general adult medical examination without abnormal findings: Secondary | ICD-10-CM | POA: Diagnosis not present

## 2013-10-26 DIAGNOSIS — R0789 Other chest pain: Secondary | ICD-10-CM | POA: Diagnosis not present

## 2013-11-01 DIAGNOSIS — S8000XA Contusion of unspecified knee, initial encounter: Secondary | ICD-10-CM | POA: Diagnosis not present

## 2013-11-01 DIAGNOSIS — M1990 Unspecified osteoarthritis, unspecified site: Secondary | ICD-10-CM | POA: Diagnosis not present

## 2013-11-01 DIAGNOSIS — IMO0002 Reserved for concepts with insufficient information to code with codable children: Secondary | ICD-10-CM | POA: Diagnosis not present

## 2013-12-17 DIAGNOSIS — Z79899 Other long term (current) drug therapy: Secondary | ICD-10-CM | POA: Diagnosis not present

## 2013-12-17 DIAGNOSIS — I10 Essential (primary) hypertension: Secondary | ICD-10-CM | POA: Diagnosis not present

## 2013-12-17 DIAGNOSIS — E785 Hyperlipidemia, unspecified: Secondary | ICD-10-CM | POA: Diagnosis not present

## 2013-12-17 DIAGNOSIS — M1710 Unilateral primary osteoarthritis, unspecified knee: Secondary | ICD-10-CM | POA: Diagnosis not present

## 2013-12-17 DIAGNOSIS — IMO0002 Reserved for concepts with insufficient information to code with codable children: Secondary | ICD-10-CM | POA: Diagnosis not present

## 2013-12-17 DIAGNOSIS — E039 Hypothyroidism, unspecified: Secondary | ICD-10-CM | POA: Diagnosis not present

## 2013-12-17 DIAGNOSIS — R51 Headache: Secondary | ICD-10-CM | POA: Diagnosis not present

## 2013-12-17 DIAGNOSIS — R350 Frequency of micturition: Secondary | ICD-10-CM | POA: Diagnosis not present

## 2013-12-20 ENCOUNTER — Other Ambulatory Visit: Payer: Self-pay | Admitting: Family Medicine

## 2013-12-20 DIAGNOSIS — W19XXXA Unspecified fall, initial encounter: Secondary | ICD-10-CM

## 2013-12-20 DIAGNOSIS — E785 Hyperlipidemia, unspecified: Secondary | ICD-10-CM

## 2013-12-24 ENCOUNTER — Other Ambulatory Visit: Payer: Medicare Other

## 2013-12-27 ENCOUNTER — Ambulatory Visit
Admission: RE | Admit: 2013-12-27 | Discharge: 2013-12-27 | Disposition: A | Discharge status: Home / Self Care | Payer: Medicare Other | Source: Ambulatory Visit | Attending: Family Medicine | Admitting: Family Medicine

## 2013-12-27 DIAGNOSIS — W19XXXA Unspecified fall, initial encounter: Secondary | ICD-10-CM

## 2013-12-27 DIAGNOSIS — I658 Occlusion and stenosis of other precerebral arteries: Secondary | ICD-10-CM | POA: Diagnosis not present

## 2013-12-27 DIAGNOSIS — E785 Hyperlipidemia, unspecified: Secondary | ICD-10-CM

## 2014-02-08 DIAGNOSIS — F3289 Other specified depressive episodes: Secondary | ICD-10-CM | POA: Diagnosis not present

## 2014-02-08 DIAGNOSIS — R350 Frequency of micturition: Secondary | ICD-10-CM | POA: Diagnosis not present

## 2014-02-08 DIAGNOSIS — E785 Hyperlipidemia, unspecified: Secondary | ICD-10-CM | POA: Diagnosis not present

## 2014-02-08 DIAGNOSIS — F329 Major depressive disorder, single episode, unspecified: Secondary | ICD-10-CM | POA: Diagnosis not present

## 2014-02-08 DIAGNOSIS — R232 Flushing: Secondary | ICD-10-CM | POA: Diagnosis not present

## 2014-03-04 ENCOUNTER — Telehealth: Payer: Self-pay | Admitting: Nurse Practitioner

## 2014-03-04 NOTE — Telephone Encounter (Signed)
Spoke to Truitt Merle NP advised ok to schedule appointment to see her Monday 03/07/14 at 10:00 am.

## 2014-03-04 NOTE — Telephone Encounter (Signed)
New Problem:  Pt states last night she felt like she was going to pass out. She states it felt like something was pulling her to the left.. She felt like she was going to fall... Pt wants to know if it could have something to do with her BP or meds... Pt states her BP was 101/58 about an hour after that incident last night... Pt is requesting a call back from the nurse.

## 2014-03-04 NOTE — Telephone Encounter (Signed)
Returned call to patient she stated she had a episode last night when she was walking dog outside dizzy,pulling sensation to the left side.Stated she she went back in B/P 101/58 pulse 50.Stated she has appointment with Truitt Merle NP 03/23/14 would like a sooner appointment.Stated she just feels tired this morning.

## 2014-03-07 ENCOUNTER — Encounter: Payer: Self-pay | Admitting: Nurse Practitioner

## 2014-03-07 ENCOUNTER — Ambulatory Visit (INDEPENDENT_AMBULATORY_CARE_PROVIDER_SITE_OTHER): Payer: Medicare Other | Admitting: Nurse Practitioner

## 2014-03-07 VITALS — BP 130/70 | HR 62 | Ht 64.5 in | Wt 172.1 lb

## 2014-03-07 DIAGNOSIS — R0609 Other forms of dyspnea: Secondary | ICD-10-CM

## 2014-03-07 DIAGNOSIS — R42 Dizziness and giddiness: Secondary | ICD-10-CM

## 2014-03-07 DIAGNOSIS — I10 Essential (primary) hypertension: Secondary | ICD-10-CM | POA: Diagnosis not present

## 2014-03-07 DIAGNOSIS — R0989 Other specified symptoms and signs involving the circulatory and respiratory systems: Secondary | ICD-10-CM

## 2014-03-07 DIAGNOSIS — R0600 Dyspnea, unspecified: Secondary | ICD-10-CM

## 2014-03-07 DIAGNOSIS — R55 Syncope and collapse: Secondary | ICD-10-CM

## 2014-03-07 DIAGNOSIS — R011 Cardiac murmur, unspecified: Secondary | ICD-10-CM

## 2014-03-07 LAB — BASIC METABOLIC PANEL
BUN: 18 mg/dL (ref 6–23)
CO2: 30 mEq/L (ref 19–32)
Calcium: 9.4 mg/dL (ref 8.4–10.5)
Chloride: 99 mEq/L (ref 96–112)
Creatinine, Ser: 0.8 mg/dL (ref 0.4–1.2)
GFR: 75.69 mL/min (ref >60.00)
Glucose, Bld: 74 mg/dL (ref 70–99)
Potassium: 4.4 mEq/L (ref 3.5–5.1)
Sodium: 138 mEq/L (ref 135–145)

## 2014-03-07 LAB — HEPATIC FUNCTION PANEL
ALT: 15 U/L (ref 0–35)
AST: 21 U/L (ref 0–37)
Albumin: 3.8 g/dL (ref 3.5–5.2)
Alkaline Phosphatase: 79 U/L (ref 39–117)
Bilirubin, Direct: 0.1 mg/dL (ref 0.0–0.3)
Total Bilirubin: 0.6 mg/dL (ref 0.3–1.2)
Total Protein: 6.4 g/dL (ref 6.0–8.3)

## 2014-03-07 LAB — CBC
HCT: 37 % (ref 36.0–46.0)
Hemoglobin: 12.2 g/dL (ref 12.0–15.0)
MCHC: 32.9 g/dL (ref 30.0–36.0)
MCV: 91 fl (ref 78.0–100.0)
Platelets: 272 10*3/uL (ref 150.0–400.0)
RBC: 4.07 Mil/uL (ref 3.87–5.11)
RDW: 14.6 % (ref 11.5–14.6)
WBC: 6.3 10*3/uL (ref 4.5–10.5)

## 2014-03-07 LAB — TSH: TSH: 0.53 u[IU]/mL (ref 0.35–5.50)

## 2014-03-07 LAB — LIPID PANEL
Cholesterol: 148 mg/dL (ref 0–200)
HDL: 62.2 mg/dL (ref >39.00)
LDL Cholesterol: 74 mg/dL (ref 0–99)
Total CHOL/HDL Ratio: 2
Triglycerides: 58 mg/dL (ref 0.0–149.0)
VLDL: 11.6 mg/dL (ref 0.0–40.0)

## 2014-03-07 NOTE — Patient Instructions (Signed)
For now, stay on your current medicines  Let's try going back on a baby aspirin - let me know if you have trouble with this  We will check labs today  We will arrange for an echocardiogram and a CT of the head  I need to see you back for discussion - bring your BP cuff with you so we can check its accuracy  Call the [facility] office at [phone] if you have any questions, problems or concerns.

## 2014-03-07 NOTE — Progress Notes (Addendum)
[place] Date of Birth: [birth year] Medical Record [id number]  History of Present Illness: [name] is seen back today for a work in visit. She was previously seen by Dr. Verl Blalock. She has CAD and HTN. Her HTN has been difficult to treat. Other issues include OA, GERD, HLD, anxiety and depression and hypothyroidism.   She was last seen by me in July of 2014. Was considering knee replacement in the future with Dr. Marlou Sa.   Called this past Friday - had had a prior spell of presyncope the night before - felt like something was pulling her to the left side - BP was 101/58. Thus added to my schedule for today. I was to see her later this month.  Comes in today. Here alone. She did not have her knee replacement - was told it was felt to be too risky with her age. She has had some falls since I last saw her - no significant injury. She has been more limited by her knee pain - using Tylenol. She notes that last Thursday night, she was taking her dog outside - she felt like she was being pulled to the left - had to hold on to the door and then the flag holder so she would not fall. Lasted about 5 to 8 minutes. She does not know how her speech was and thinks her left arm/leg was moving ok but she is not sure. Now using a wrist cuff to check her BP - readings at home are within range. Notes more swelling in her legs. Seems more short of breath as well. Had a spell of chest pain but thought it was stress since it was in the setting of a busted pipe and water pouring into her bedroom.    Current Outpatient Prescriptions  Medication Sig Dispense Refill  . acetaminophen (TYLENOL) 500 MG tablet Take 500 mg by mouth every 6 (six) hours as needed.      Marland Kitchen alendronate (FOSAMAX) 70 MG tablet Take 70 mg by mouth every 7 (seven) days. Take with a full glass of water on an empty stomach.      Marland Kitchen atenolol (TENORMIN) 50 MG tablet Take 50 mg by mouth 2 (two) times daily.       Marland Kitchen atorvastatin (LIPITOR) 20 MG tablet Take 10  mg by mouth daily.       . Cholecalciferol (VITAMIN D-3) 1000 UNITS CAPS Take 2,000 Units by mouth daily.      . DULoxetine (CYMBALTA) 30 MG capsule Take 60 mg by mouth daily.       Marland Kitchen esomeprazole (NEXIUM) 40 MG capsule Take 40 mg by mouth daily.       . furosemide (LASIX) 20 MG tablet Take 20 mg by mouth daily.      Marland Kitchen levothyroxine (SYNTHROID, LEVOTHROID) 112 MCG tablet Take 112 mcg by mouth daily before breakfast.      . psyllium (REGULOID) 0.52 G capsule Take 0.52 g by mouth 2 (two) times daily.      . valsartan (DIOVAN) 80 MG tablet Take 80 mg by mouth 2 (two) times daily.      . verapamil (COVERA HS) 180 MG (CO) 24 hr tablet Take 180 mg by mouth at bedtime.      . vitamin C (ASCORBIC ACID) 500 MG tablet Take 1,000 mg by mouth daily.       . cyanocobalamin 2000 MCG tablet Take 2,000 mcg by mouth daily.      . Glucosamine-Vitamin D (GLUCOSAMINE  PLUS VITAMIN D) 1500-200 MG-UNIT TABS Take by mouth.      . Misc Natural Products (SUPER ENERGY PO) Take by mouth daily.       No current facility-administered medications for this visit.    Allergies  Allergen Reactions  . Aspirin Other (See Comments)    Baby aspirin is tolerated, itching in the past  . Percocet [Oxycodone-Acetaminophen]   . Prednisone Other (See Comments)    Heart palpitations  . Sulfonamide Derivatives Other (See Comments)    Unknown reaction a long time ago    Past Medical History  Diagnosis Date  . Coronary artery disease   . Hyperlipidemia   . Orthostatic hypotension   . Hypertension   . Osteoarthrosis, unspecified whether generalized or localized, unspecified site   . Family history of malignant neoplasm of gastrointestinal tract   . Allergic rhinitis, cause unspecified   . Unspecified functional disorder of intestine   . H/O: hysterectomy   . Osteoporosis   . Anxiety and depression   . GERD (gastroesophageal reflux disease)   . Hypothyroidism   . Chronic headaches     Past Surgical History  Procedure  Laterality Date  . Cardiac catheterization    . Total knee arthroplasty    . Nasal sinus surgery    . Tonsillectomy    . Appendectomy    . Cyst removal on back    . Cervical spine surgery    . Knee surgery      History  Smoking status  . Never Smoker   Smokeless tobacco  . Not on file    Comment: was a passive smoker for 15 yrs    History  Alcohol Use No    Family History  Problem Relation Age of Onset  . Brain cancer Other     1st degree relative  . Heart disease Other   . Colon cancer Mother   . Heart attack Father 46  . Colon cancer      1st degree relative    Review of Systems: The review of systems is per the HPI.  All other systems were reviewed and are negative.  Physical Exam: BP 130/70  Pulse 62  Ht 5' 4.5" (1.638 m)  Wt 172 lb 1.9 oz (78.073 kg)  BMI 29.10 kg/m2  SpO2 100% Patient is very pleasant and in no acute distress. Skin is warm and dry. Color is normal.  HEENT is unremarkable. Normocephalic/atraumatic. PERRL. Sclera are nonicteric. Neck is supple. No masses. No JVD. Lungs are clear. Cardiac exam shows an irregular rhythm. Her rate is ok. She has a soft outflow murmur. Abdomen is soft. Extremities are with trace edema - has multiple varicosities. Gait and ROM are intact. No gross neurologic deficits noted.  Wt Readings from Last 3 Encounters:  03/07/14 172 lb 1.9 oz (78.073 kg)  06/30/13 159 lb 6.4 oz (72.303 kg)  06/21/12 177 lb 0.5 oz (80.3 kg)    LABORATORY DATA: Labs pending  EKG pending  Lab Results  Component Value Date   WBC 7.0 07/08/2013   HGB 12.4 07/08/2013   HCT 37.1 07/08/2013   PLT 248.0 07/08/2013   GLUCOSE 90 07/23/2013   CHOL 163 07/08/2013   TRIG 61.0 07/08/2013   HDL 67.20 07/08/2013   LDLCALC 84 07/08/2013   ALT 14 07/08/2013   AST 21 07/08/2013   NA 135 07/23/2013   K 4.0 07/23/2013   CL 99 07/23/2013   CREATININE 0.8 07/23/2013   BUN 13 07/23/2013  CO2 30 07/23/2013   TSH 0.069* 06/21/2012   INR 2.63* 06/29/2012    CARDIAC CATH IMPRESSION FROM MAY 2012  1. Mild-to-moderate nonobstructive coronary artery disease.  2. Normal left ventricular systolic function.  RECOMMENDATIONS: I do not see any lesions that would be causing chest  pain and dyspnea in this patient. I would recommend continued medical  management.  Lauree Chandler, MD  CM/MEDQ D: 05/06/2011 T: 05/07/2011 Job: [id number]  cc: Thomas C. Verl Blalock, MD, [facility]     CAROTID DOPPLER IMPRESSION FROM December 2014: 1. Mild bilateral carotid bifurcation plaque resulting in less than 50% diameter stenosis. The exam does not exclude plaque ulceration or embolization. Continued surveillance recommended.   Electronically Signed By: Arne Cleveland M.D. On: 12/27/2013 14:24   Assessment / Plan:  1. HTN - I think that she has fair BP control. I would leave her on her current regimen. We will need to recheck her new wrist cuff  2. CAD - last cath in May of 2012 with mild to moderate nonobstructive CAD - managed medically.   3. OA - which limits how mobile she can be.   4. Presyncope - ?TIA - will check CT of the head - she is agreeable to going back on aspirin - she does not think she has had any issue with the 81 mg dose  5. Murmur/swelling/dyspnea - check echo  I will see her back for discussion. For now no change in her medicines. We will recheck her labs today. Further disposition to follow.  Patient is agreeable to this plan and will call if any problems develop in the interim.   Burtis Junes, RN, [place] [address] [place], [place]  [phone]  Addendum:  EKG unchanged - showing sinus brady.     Addendum:  Echo results reviewed with Dr. Acie Fredrickson on 03/30/2014. Typically an atrial septal aneurysm is a benign finding but if patient is felt to be having stroke like symptoms, would need to consider sending for bubble study.   Will discuss further with her at her  return visit but will probably need bubble study.

## 2014-03-09 ENCOUNTER — Ambulatory Visit (INDEPENDENT_AMBULATORY_CARE_PROVIDER_SITE_OTHER)
Admission: RE | Admit: 2014-03-09 | Discharge: 2014-03-09 | Disposition: A | Discharge status: Home / Self Care | Payer: Medicare Other | Source: Ambulatory Visit | Attending: Nurse Practitioner | Admitting: Nurse Practitioner

## 2014-03-09 DIAGNOSIS — R011 Cardiac murmur, unspecified: Secondary | ICD-10-CM | POA: Diagnosis not present

## 2014-03-09 DIAGNOSIS — R42 Dizziness and giddiness: Secondary | ICD-10-CM | POA: Diagnosis not present

## 2014-03-09 DIAGNOSIS — I10 Essential (primary) hypertension: Secondary | ICD-10-CM | POA: Diagnosis not present

## 2014-03-09 DIAGNOSIS — R55 Syncope and collapse: Secondary | ICD-10-CM

## 2014-03-09 DIAGNOSIS — R0600 Dyspnea, unspecified: Secondary | ICD-10-CM

## 2014-03-09 DIAGNOSIS — R0609 Other forms of dyspnea: Secondary | ICD-10-CM

## 2014-03-09 DIAGNOSIS — R0989 Other specified symptoms and signs involving the circulatory and respiratory systems: Secondary | ICD-10-CM

## 2014-03-14 ENCOUNTER — Other Ambulatory Visit: Payer: Self-pay

## 2014-03-14 DIAGNOSIS — Z1231 Encounter for screening mammogram for malignant neoplasm of breast: Secondary | ICD-10-CM

## 2014-03-15 DIAGNOSIS — J3089 Other allergic rhinitis: Secondary | ICD-10-CM | POA: Diagnosis not present

## 2014-03-15 DIAGNOSIS — J301 Allergic rhinitis due to pollen: Secondary | ICD-10-CM | POA: Diagnosis not present

## 2014-03-16 ENCOUNTER — Encounter: Payer: Self-pay | Admitting: Nurse Practitioner

## 2014-03-23 ENCOUNTER — Ambulatory Visit: Payer: Medicare Other | Admitting: Nurse Practitioner

## 2014-03-28 ENCOUNTER — Other Ambulatory Visit (HOSPITAL_COMMUNITY): Payer: Self-pay | Admitting: Nurse Practitioner

## 2014-03-28 ENCOUNTER — Ambulatory Visit (HOSPITAL_COMMUNITY): Payer: Medicare Other | Attending: Cardiology | Admitting: Radiology

## 2014-03-28 ENCOUNTER — Encounter: Payer: Self-pay | Admitting: Cardiology

## 2014-03-28 DIAGNOSIS — R011 Cardiac murmur, unspecified: Secondary | ICD-10-CM

## 2014-03-28 DIAGNOSIS — R609 Edema, unspecified: Secondary | ICD-10-CM

## 2014-03-28 DIAGNOSIS — R0602 Shortness of breath: Secondary | ICD-10-CM

## 2014-03-28 NOTE — Progress Notes (Signed)
Echocardiogram performed.  

## 2014-04-01 ENCOUNTER — Ambulatory Visit
Admission: RE | Admit: 2014-04-01 | Discharge: 2014-04-01 | Disposition: A | Discharge status: Home / Self Care | Payer: Medicare Other | Source: Ambulatory Visit

## 2014-04-01 ENCOUNTER — Ambulatory Visit: Payer: Medicare Other | Admitting: Nurse Practitioner

## 2014-04-01 DIAGNOSIS — Z1231 Encounter for screening mammogram for malignant neoplasm of breast: Secondary | ICD-10-CM

## 2014-04-06 ENCOUNTER — Encounter: Payer: Self-pay | Admitting: Nurse Practitioner

## 2014-04-06 ENCOUNTER — Ambulatory Visit (INDEPENDENT_AMBULATORY_CARE_PROVIDER_SITE_OTHER): Payer: Medicare Other | Admitting: Nurse Practitioner

## 2014-04-06 VITALS — BP 160/90 | HR 56 | Ht 64.5 in | Wt 168.8 lb

## 2014-04-06 DIAGNOSIS — I10 Essential (primary) hypertension: Secondary | ICD-10-CM | POA: Diagnosis not present

## 2014-04-06 DIAGNOSIS — I2510 Atherosclerotic heart disease of native coronary artery without angina pectoris: Secondary | ICD-10-CM | POA: Diagnosis not present

## 2014-04-06 NOTE — Patient Instructions (Signed)
Stay on all your current medicines  I will see you in 3 months  Try to be safe and no falls  Call the [place] office at [phone] if you have any questions, problems or concerns.

## 2014-04-06 NOTE — Progress Notes (Signed)
[place] Date of Birth: 1933-05-23 Medical Record #[id number]  History of Present Illness: [name] [name] is seen back today for a one month check. Former patient of Dr. Winnifred Friar. She has CAD and HTN. HTN has been difficult to treat. Other issues include OA, GERD, HLD, anxiety, depression and hypothyroidism.  I had seen her in July of 2014 - she was contemplating knee replacement - did not have - said she was told that it was felt to be "too risky".  I saw her last month after a spell of presyncope associated with a sensation of being "pulled to the left" and having to hold on to a door so she would not fall. She also endorsed feeling more short of breath and having more swelling. Murmur noted on exam. Concern was for possible TIA. We restarted her aspirin and obtained an echo, CT of the head and labs.  She had had recent carotid dopplers.   Echo with normal EF, severe TR and an atrial septal aneurysm - discussed with Dr. Acie Fredrickson who felt that this was typically a benign finding unless there was concern for stroke - then would need to consider bubble study.  Comes back today. Here alone. She says she thinks she is doing well. Rushed to get here today. Leaving for [place] today after her husband's kids take him to see the doctor for possible dementia. Not taking her aspirin - that hurt her stomach. She has had a fall down her stairs at her home this past weekend - no injury - says the steps are too narrow and she just tripped. She has done that before. Forgot her BP cuff but says her BP has been ok. No more presyncope. No more spells that sound like a TIA/CVA. No chest pain.   Current Outpatient Prescriptions  Medication Sig Dispense Refill  . acetaminophen (TYLENOL) 500 MG tablet Take 500 mg by mouth every 6 (six) hours as needed.      Marland Kitchen alendronate (FOSAMAX) 70 MG tablet Take 70 mg by mouth every 7 (seven) days. Take with a full glass of water on an empty stomach.      Marland Kitchen atenolol (TENORMIN) 50 MG tablet  Take 50 mg by mouth 2 (two) times daily.       Marland Kitchen atorvastatin (LIPITOR) 20 MG tablet Take 10 mg by mouth daily.       . Cholecalciferol (VITAMIN D-3) 1000 UNITS CAPS Take 2,000 Units by mouth daily.      . cyanocobalamin 2000 MCG tablet Take 2,000 mcg by mouth daily.      . DULoxetine (CYMBALTA) 30 MG capsule Take 60 mg by mouth daily.       Marland Kitchen esomeprazole (NEXIUM) 40 MG capsule Take 40 mg by mouth daily.       . furosemide (LASIX) 20 MG tablet Take 20 mg by mouth daily.      . Glucosamine-Vitamin D (GLUCOSAMINE PLUS VITAMIN D) 1500-200 MG-UNIT TABS Take by mouth.      . levothyroxine (SYNTHROID, LEVOTHROID) 112 MCG tablet Take 112 mcg by mouth daily before breakfast.      . psyllium (REGULOID) 0.52 G capsule Take 0.52 g by mouth 2 (two) times daily.      . valsartan (DIOVAN) 80 MG tablet Take 80 mg by mouth 2 (two) times daily.      . verapamil (COVERA HS) 180 MG (CO) 24 hr tablet Take 180 mg by mouth at bedtime.      . vitamin C (ASCORBIC  ACID) 500 MG tablet Take 1,000 mg by mouth daily.        No current facility-administered medications for this visit.    Allergies  Allergen Reactions  . Aspirin Other (See Comments)    Baby aspirin is tolerated, itching in the past  . Percocet [Oxycodone-Acetaminophen]   . Prednisone Other (See Comments)    Heart palpitations  . Sulfonamide Derivatives Other (See Comments)    Unknown reaction a long time ago    Past Medical History  Diagnosis Date  . Coronary artery disease   . Hyperlipidemia   . Orthostatic hypotension   . Hypertension   . Osteoarthrosis, unspecified whether generalized or localized, unspecified site   . Family history of malignant neoplasm of gastrointestinal tract   . Allergic rhinitis, cause unspecified   . Unspecified functional disorder of intestine   . H/O: hysterectomy   . Osteoporosis   . Anxiety and depression   . GERD (gastroesophageal reflux disease)   . Hypothyroidism   . Chronic headaches     Past  Surgical History  Procedure Laterality Date  . Cardiac catheterization    . Total knee arthroplasty    . Nasal sinus surgery    . Tonsillectomy    . Appendectomy    . Cyst removal on back    . Cervical spine surgery    . Knee surgery      History  Smoking status  . Never Smoker   Smokeless tobacco  . Not on file    Comment: was a passive smoker for 15 yrs    History  Alcohol Use No    Family History  Problem Relation Age of Onset  . Brain cancer Other     1st degree relative  . Heart disease Other   . Colon cancer Mother   . Heart attack Father 72  . Colon cancer      1st degree relative    Review of Systems: The review of systems is per the HPI.  All other systems were reviewed and are negative.  Physical Exam: BP 160/90  Pulse 56  Ht 5' 4.5" (1.638 m)  Wt 168 lb 12.8 oz (76.567 kg)  BMI 28.54 kg/m2  SpO2 100% BP is 150/80 by me Patient is very pleasant and in no acute distress. Skin is warm and dry. Color is normal.  HEENT is unremarkable. Normocephalic/atraumatic. PERRL. Sclera are nonicteric. Neck is supple. No masses. No JVD. Lungs are clear. Cardiac exam shows a regular rate and rhythm. Abdomen is soft. Extremities are with just trace ankle edema. Lots of varicosities. Gait and ROM are intact. No gross neurologic deficits noted.  Wt Readings from Last 3 Encounters:  04/06/14 168 lb 12.8 oz (76.567 kg)  03/07/14 172 lb 1.9 oz (78.073 kg)  06/30/13 159 lb 6.4 oz (72.303 kg)     LABORATORY DATA: Lab Results  Component Value Date   WBC 6.3 03/07/2014   HGB 12.2 03/07/2014   HCT 37.0 03/07/2014   PLT 272.0 03/07/2014   GLUCOSE 74 03/07/2014   CHOL 148 03/07/2014   TRIG 58.0 03/07/2014   HDL 62.20 03/07/2014   LDLCALC 74 03/07/2014   ALT 15 03/07/2014   AST 21 03/07/2014   NA 138 03/07/2014   K 4.4 03/07/2014   CL 99 03/07/2014   CREATININE 0.8 03/07/2014   BUN 18 03/07/2014   CO2 30 03/07/2014   TSH 0.53 03/07/2014   INR 2.63* 06/29/2012   Echo Study Conclusions from March  2015  - Left ventricle: The cavity size was normal. There was mild focal basal hypertrophy of the septum. Systolic function was normal. The estimated ejection fraction was in the range of 55% to 60%. Wall motion was normal; there were no regional wall motion abnormalities. Left ventricular diastolic function parameters were normal. - Mitral valve: Mild regurgitation. - Left atrium: The atrium was mildly dilated. - Atrial septum: There was an atrial septal aneurysm. - Tricuspid valve: Severe regurgitation. - Pulmonary arteries: Systolic pressure was moderately increased. PA peak pressure: 97mm Hg (S).  CT HEAD WITHOUT CONTRAST  TECHNIQUE Contiguous axial images were obtained from the base of the skull through the vertex without intravenous contrast.  COMPARISON None.  FINDINGS No mass lesion, mass effect, midline shift, hydrocephalus, hemorrhage. No territorial ischemia or acute infarction. Age-appropriate atrophy is present.  IMPRESSION Normal CT head for age.  SIGNATURE  Electronically Signed By: Dereck Ligas M.D. On: 03/09/2014 15:17  CARDIAC CATH IMPRESSION FROM MAY 2012  1. Mild-to-moderate nonobstructive coronary artery disease.  2. Normal left ventricular systolic function.  RECOMMENDATIONS: I do not see any lesions that would be causing chest  pain and dyspnea in this patient. I would recommend continued medical  management.  Lauree Chandler, MD  CM/MEDQ D: 05/06/2011 T: 05/07/2011 Job: [id number]  cc: Thomas C. Verl Blalock, MD, [facility]     CAROTID DOPPLER IMPRESSION FROM December 2014: 1. Mild bilateral carotid bifurcation plaque resulting in less than 50% diameter stenosis. The exam does not exclude plaque ulceration or embolization. Continued surveillance recommended.   Electronically Signed By: Arne Cleveland M.D. On: 12/27/2013 14:24    Assessment / Plan:   1. HTN - BP up some today - she has not had all of her medicines today. Better control at  home. She will continue to monitor.   2. CAD - last cath in May of 2012 with mild to moderate nonobstructive CAD - managed medically. No symptoms reported.   3. OA - which limits how mobile she can be.   4. Presyncope - has not recurred   5. Valvular heart disease - would favor medical management.  6. Possible TIA - no recurrence. Not able to tolerate aspirin therapy. Will watch for now. She is to let us know if she has recurrence.   Patient is agreeable to this plan and will call if any problems develop in the interim.   Burtis Junes, RN, [place]  [address] [place]  [place], [place] [address]  [phone]

## 2014-05-19 DIAGNOSIS — J019 Acute sinusitis, unspecified: Secondary | ICD-10-CM | POA: Diagnosis not present

## 2014-05-19 DIAGNOSIS — J029 Acute pharyngitis, unspecified: Secondary | ICD-10-CM | POA: Diagnosis not present

## 2014-06-12 DIAGNOSIS — K219 Gastro-esophageal reflux disease without esophagitis: Secondary | ICD-10-CM | POA: Diagnosis not present

## 2014-06-12 DIAGNOSIS — K2900 Acute gastritis without bleeding: Secondary | ICD-10-CM | POA: Diagnosis not present

## 2014-07-06 ENCOUNTER — Ambulatory Visit (INDEPENDENT_AMBULATORY_CARE_PROVIDER_SITE_OTHER): Payer: Medicare Other | Admitting: Nurse Practitioner

## 2014-07-06 ENCOUNTER — Encounter: Payer: Self-pay | Admitting: Nurse Practitioner

## 2014-07-06 VITALS — BP 140/80 | HR 56 | Ht 64.5 in | Wt 169.8 lb

## 2014-07-06 DIAGNOSIS — I10 Essential (primary) hypertension: Secondary | ICD-10-CM | POA: Diagnosis not present

## 2014-07-06 DIAGNOSIS — E785 Hyperlipidemia, unspecified: Secondary | ICD-10-CM | POA: Diagnosis not present

## 2014-07-06 DIAGNOSIS — R0989 Other specified symptoms and signs involving the circulatory and respiratory systems: Secondary | ICD-10-CM

## 2014-07-06 DIAGNOSIS — I2510 Atherosclerotic heart disease of native coronary artery without angina pectoris: Secondary | ICD-10-CM

## 2014-07-06 DIAGNOSIS — I25118 Atherosclerotic heart disease of native coronary artery with other forms of angina pectoris: Secondary | ICD-10-CM

## 2014-07-06 DIAGNOSIS — I209 Angina pectoris, unspecified: Secondary | ICD-10-CM

## 2014-07-06 DIAGNOSIS — R079 Chest pain, unspecified: Secondary | ICD-10-CM

## 2014-07-06 DIAGNOSIS — R0609 Other forms of dyspnea: Secondary | ICD-10-CM | POA: Diagnosis not present

## 2014-07-06 DIAGNOSIS — R0600 Dyspnea, unspecified: Secondary | ICD-10-CM

## 2014-07-06 MED ORDER — NITROGLYCERIN 0.4 MG SL SUBL: 0.4000 mg | SUBLINGUAL_TABLET | SUBLINGUAL | Status: DC | PRN

## 2014-07-06 NOTE — Patient Instructions (Addendum)
I have sent in a RX for NTG - Use your NTG under your tongue for recurrent chest pain. May take one tablet every 5 minutes. If you are still having discomfort after 3 tablets in 15 minutes, call 911.  We will arrange for a stress test Carlton Adam)  We will see how your stress test turns out and then decide about follow up.  Stay on your current medicines.  Continue to monitor your blood pressure at home  Call the [place] office at [phone] if you have any questions, problems or concerns.

## 2014-07-06 NOTE — Progress Notes (Signed)
[name] [name] Date of Birth: [birth year] Medical Record #[id number]  History of Present Illness: [name] is seen back today for a 3 month check. Former patient of Dr. Winnifred Friar. She has CAD and HTN. HTN has been difficult to treat. Other issues include OA, GERD, HLD, anxiety, depression and hypothyroidism.   I had seen her in July of 2014 - she was contemplating knee replacement - did not have - said she was told that it was felt to be "too risky".   I saw her back in March after a spell of presyncope associated with a sensation of being "pulled to the left" and having to hold on to a door so she would not fall. She also endorsed feeling more short of breath and having more swelling. Murmur noted on exam. Concern was for possible TIA. We restarted her aspirin and obtained an echo, CT of the head and labs. She had had recent carotid dopplers. Echo with normal EF, severe TR and an atrial septal aneurysm - discussed with Dr. Acie Fredrickson who felt that this was typically a benign finding unless there was concern for stroke - then would need to consider bubble study.   Last seen in April. Was doing ok. No more spells that sounded like TIA/CVA. Wanted to take a "watch and wait approach" in regards to bubble study.   Comes back today. Here alone. Notes that she feels more fatigued. No more spells that sounded like TIA/stroke. BP good at home by her diary. She does note some discomfort in the midsternal chest - worse with exertion and associated with dyspnea. No real spells with rest. No NTG on hand. Walking less due to the heat.    Current Outpatient Prescriptions  Medication Sig Dispense Refill  . acetaminophen (TYLENOL) 500 MG tablet Take 500 mg by mouth every 6 (six) hours as needed.      Marland Kitchen alendronate (FOSAMAX) 70 MG tablet Take 70 mg by mouth every 7 (seven) days. Take with a full glass of water on an empty stomach.      Marland Kitchen atenolol (TENORMIN) 50 MG tablet Take 50 mg by mouth 2 (two) times daily.       Marland Kitchen  atorvastatin (LIPITOR) 20 MG tablet Take 10 mg by mouth daily.       . Cholecalciferol (VITAMIN D-3) 1000 UNITS CAPS Take 2,000 Units by mouth daily.      . cyanocobalamin 2000 MCG tablet Take 2,000 mcg by mouth daily.      . DULoxetine (CYMBALTA) 30 MG capsule Take 60 mg by mouth daily.       Marland Kitchen esomeprazole (NEXIUM) 40 MG capsule Take 40 mg by mouth daily.       . furosemide (LASIX) 20 MG tablet Take 20 mg by mouth daily.      . Glucosamine-Vitamin D (GLUCOSAMINE PLUS VITAMIN D) 1500-200 MG-UNIT TABS Take by mouth.      . levothyroxine (SYNTHROID, LEVOTHROID) 112 MCG tablet Take 112 mcg by mouth daily before breakfast.      . psyllium (REGULOID) 0.52 G capsule Take 0.52 g by mouth 2 (two) times daily.      . valsartan (DIOVAN) 80 MG tablet Take 80 mg by mouth 2 (two) times daily.      . verapamil (COVERA HS) 180 MG (CO) 24 hr tablet Take 180 mg by mouth at bedtime.      . vitamin C (ASCORBIC ACID) 500 MG tablet Take 1,000 mg by mouth daily.  No current facility-administered medications for this visit.    Allergies  Allergen Reactions  . Aspirin Other (See Comments)    Baby aspirin is tolerated, itching in the past  . Percocet [Oxycodone-Acetaminophen]   . Prednisone Other (See Comments)    Heart palpitations  . Sulfonamide Derivatives Other (See Comments)    Unknown reaction a long time ago    Past Medical History  Diagnosis Date  . Coronary artery disease   . Hyperlipidemia   . Orthostatic hypotension   . Hypertension   . Osteoarthrosis, unspecified whether generalized or localized, unspecified site   . Family history of malignant neoplasm of gastrointestinal tract   . Allergic rhinitis, cause unspecified   . Unspecified functional disorder of intestine   . H/O: hysterectomy   . Osteoporosis   . Anxiety and depression   . GERD (gastroesophageal reflux disease)   . Hypothyroidism   . Chronic headaches     Past Surgical History  Procedure Laterality Date  .  Cardiac catheterization    . Total knee arthroplasty    . Nasal sinus surgery    . Tonsillectomy    . Appendectomy    . Cyst removal on back    . Cervical spine surgery    . Knee surgery      History  Smoking status  . Never Smoker   Smokeless tobacco  . Not on file    Comment: was a passive smoker for 15 yrs    History  Alcohol Use No    Family History  Problem Relation Age of Onset  . Brain cancer Other     1st degree relative  . Heart disease Other   . Colon cancer Mother   . Heart attack Father 69  . Colon cancer      1st degree relative    Review of Systems: The review of systems is per the HPI.  Chronic knee pain. All other systems were reviewed and are negative.  Physical Exam: There were no vitals taken for this visit. Patient is very pleasant and in no acute distress. Skin is warm and dry. Color is normal.  HEENT is unremarkable. Normocephalic/atraumatic. PERRL. Sclera are nonicteric. Neck is supple. No masses. No JVD. Lungs are clear. Cardiac exam shows a regular rate and rhythm. Soft systolic murmur noted. Abdomen is soft. Extremities are without edema. Lots of varicosities. Gait and ROM are intact. No gross neurologic deficits noted.  Wt Readings from Last 3 Encounters:  04/06/14 168 lb 12.8 oz (76.567 kg)  03/07/14 172 lb 1.9 oz (78.073 kg)  06/30/13 159 lb 6.4 oz (72.303 kg)    LABORATORY DATA/PROCEDURES: EKG with sinus brady - unchanged from prior tracing.   Lab Results  Component Value Date   WBC 6.3 03/07/2014   HGB 12.2 03/07/2014   HCT 37.0 03/07/2014   PLT 272.0 03/07/2014   GLUCOSE 74 03/07/2014   CHOL 148 03/07/2014   TRIG 58.0 03/07/2014   HDL 62.20 03/07/2014   LDLCALC 74 03/07/2014   ALT 15 03/07/2014   AST 21 03/07/2014   NA 138 03/07/2014   K 4.4 03/07/2014   CL 99 03/07/2014   CREATININE 0.8 03/07/2014   BUN 18 03/07/2014   CO2 30 03/07/2014   TSH 0.53 03/07/2014   INR 2.63* 06/29/2012    BNP (last 3 results) No results found for this basename: PROBNP,   in the last 8760 hours  Echo Study Conclusions from March 2015  - Left ventricle: The cavity  size was normal. There was mild focal basal hypertrophy of the septum. Systolic function was normal. The estimated ejection fraction was in the range of 55% to 60%. Wall motion was normal; there were no regional wall motion abnormalities. Left ventricular diastolic function parameters were normal. - Mitral valve: Mild regurgitation. - Left atrium: The atrium was mildly dilated. - Atrial septum: There was an atrial septal aneurysm. - Tricuspid valve: Severe regurgitation. - Pulmonary arteries: Systolic pressure was moderately increased. PA peak pressure: 6mm Hg (S).  CARDIAC CATH IMPRESSION FROM MAY 2012  1. Mild-to-moderate nonobstructive coronary artery disease.  2. Normal left ventricular systolic function.  RECOMMENDATIONS: I do not see any lesions that would be causing chest  pain and dyspnea in this patient. I would recommend continued medical  management.  Lauree Chandler, MD  CM/MEDQ D: 05/06/2011 T: 05/07/2011 Job: [id number]  cc: Thomas C. Verl Blalock, MD, [facility]    CAROTID DOPPLER IMPRESSION FROM December 2014: 1. Mild bilateral carotid bifurcation plaque resulting in less than 50% diameter stenosis. The exam does not exclude plaque ulceration or embolization. Continued surveillance recommended.   Electronically Signed By: Arne Cleveland M.D. On: 12/27/2013 14:24     Assessment / Plan:  1. HTN - BP fair here today - Better control at home. She will continue to monitor.   2. CAD - last cath in May of 2012 with mild to moderate nonobstructive CAD - managed medically. Now with exertional chest pain/dyspnea - will check EKG today and refer for Lexiscan. Further disposition to follow.   3. OA - which limits how mobile she can be.   4. Presyncope - has not recurred   5. Valvular heart disease - would favor medical management.   6. Possible TIA - no recurrence. Not able to  tolerate aspirin therapy. Will continue to watch for now. She is to let us know if she has recurrence.   Arrange Myoview. Further disposition to follow. Labs checked by PCP.  Patient is agreeable to this plan and will call if any problems develop in the interim.   Burtis Junes, RN, [place] [address] [place], [place]  [phone]

## 2014-07-13 ENCOUNTER — Encounter: Payer: Self-pay | Admitting: Cardiology

## 2014-07-19 ENCOUNTER — Ambulatory Visit (HOSPITAL_COMMUNITY): Payer: Medicare Other | Attending: Cardiovascular Disease | Admitting: Radiology

## 2014-07-19 VITALS — BP 133/88 | Ht 64.0 in | Wt 168.0 lb

## 2014-07-19 DIAGNOSIS — I10 Essential (primary) hypertension: Secondary | ICD-10-CM | POA: Insufficient documentation

## 2014-07-19 DIAGNOSIS — R0602 Shortness of breath: Secondary | ICD-10-CM | POA: Diagnosis not present

## 2014-07-19 DIAGNOSIS — R5383 Other fatigue: Secondary | ICD-10-CM

## 2014-07-19 DIAGNOSIS — I2510 Atherosclerotic heart disease of native coronary artery without angina pectoris: Secondary | ICD-10-CM | POA: Diagnosis not present

## 2014-07-19 DIAGNOSIS — R5381 Other malaise: Secondary | ICD-10-CM | POA: Diagnosis not present

## 2014-07-19 DIAGNOSIS — R079 Chest pain, unspecified: Secondary | ICD-10-CM | POA: Insufficient documentation

## 2014-07-19 DIAGNOSIS — R42 Dizziness and giddiness: Secondary | ICD-10-CM | POA: Insufficient documentation

## 2014-07-19 MED ORDER — TECHNETIUM TC 99M SESTAMIBI GENERIC - CARDIOLITE
33.0000 | Freq: Once | INTRAVENOUS | Status: AC | PRN
  Administered 2014-07-19: 33 via INTRAVENOUS

## 2014-07-19 MED ORDER — TECHNETIUM TC 99M SESTAMIBI GENERIC - CARDIOLITE
11.0000 | Freq: Once | INTRAVENOUS | Status: AC | PRN
  Administered 2014-07-19: 11 via INTRAVENOUS

## 2014-07-19 MED ORDER — REGADENOSON 0.4 MG/5ML IV SOLN
0.4000 mg | Freq: Once | INTRAVENOUS | Status: AC
  Administered 2014-07-19: 0.4 mg via INTRAVENOUS

## 2014-07-19 NOTE — Progress Notes (Signed)
[facility] [address] [place], [place] [address] [phone]    Cardiology Nuclear Med Study  [name] [name] is a 80 y.o. female     MRN : [id number]     DOB: Sep 27, 1933  Procedure Date: 07/19/2014  Nuclear Med Background Indication for Stress Test:  Evaluation for Ischemia History:  Heart Catheterization: mild-moderate nonobstructive CAD;3/15 Echo: EF=55-60% Cardiac Risk Factors: Carotid Disease, Family History - CAD, Hypertension and Lipids  Symptoms:  Chest Pain with Exertion (last date of chest discomfort last pm), Dizziness, DOE, Fatigue and SOB   Nuclear Pre-Procedure Caffeine/Decaff Intake:  None NPO After: 7:00am   Lungs:  clear O2 Sat: 93% on room air. IV 0.9% NS with Angio Cath:  22g  IV Site: R Hand  IV Started by:  Crissie Figures, RN  Chest Size (in):  36 Cup Size: C  Height: 5\' 4"  (1.626 m)  Weight:  168 lb (76.204 kg)  BMI:  Body mass index is 28.82 kg/(m^2). Tech Comments:  N/A    Nuclear Med Study 1 or 2 day study: 1 day  Stress Test Type:  Lexiscan  Reading MD: N/A  Order Authorizing Provider:  Mertie Moores, MD  Resting Radionuclide: Technetium 32m Sestamibi  Resting Radionuclide Dose: 11.0 mCi   Stress Radionuclide:  Technetium 37m Sestamibi  Stress Radionuclide Dose: 33.0 mCi           Stress Protocol Rest HR: 50 Stress HR: 55  Rest BP: 133/88 Stress BP: 130/86  Exercise Time (min): n/a METS: n/a   Predicted Max HR: 141 bpm % Max HR: 48.23 bpm Rate Pressure Product: 9044   Dose of Adenosine (mg):  n/a Dose of Lexiscan: 0.4 mg  Dose of Atropine (mg): n/a Dose of Dobutamine: n/a mcg/kg/min (at max HR)  Stress Test Technologist: Matilde Haymaker, RN  Nuclear Technologist:  Vedia Pereyra, CNMT     Rest Procedure:  Myocardial perfusion imaging was performed at rest 45 minutes following the intravenous administration of Technetium 61m Sestamibi. Rest ECG: NSR - Normal EKG  Stress Procedure:  The patient received  IV Lexiscan 0.4 mg over 15-seconds.  Technetium 18m Sestamibi injected at 30-seconds.  Quantitative spect images were obtained after a 45 minute delay. Stress ECG: No significant change from baseline ECG  QPS Raw Data Images:  Normal; no motion artifact; normal heart/lung ratio. Stress Images:  Normal homogeneous uptake in all areas of the myocardium. Rest Images:  Normal homogeneous uptake in all areas of the myocardium. Subtraction (SDS):  No evidence of ischemia. Transient Ischemic Dilatation (Normal <1.22):  1.17 Lung/Heart Ratio (Normal <0.45):  0.30  Quantitative Gated Spect Images QGS EDV:  63 ml QGS ESV:  20 ml  Impression Exercise Capacity:  Lexiscan with no exercise. BP Response:  Normal blood pressure response. Clinical Symptoms:  No chest pain. ECG Impression:  No significant ST segment change suggestive of ischemia. Comparison with Prior Nuclear Study: No images to compare  Overall Impression:  Normal stress nuclear study.  LV Ejection Fraction: 69%.  LV Wall Motion:  NL LV Function; NL Wall Motion   Darlin Coco MD

## 2014-07-20 NOTE — Addendum Note (Signed)
Addended by: Briscoe Burns on: 07/20/2014 08:46 AM   Modules accepted: Orders

## 2014-07-22 ENCOUNTER — Telehealth: Payer: Self-pay | Admitting: *Deleted

## 2014-07-25 NOTE — Telephone Encounter (Signed)
Message copied by Tamsen Snider on Mon Jul 25, 2014  4:35 PM ------      Message from: Burtis Junes      Created: Fri Jul 22, 2014  3:54 PM       Her stress test was ok - can we make sure she was called with the results?            lori       ----- Message -----         From: Tamsen Snider         Sent: 07/22/2014   8:13 AM           To: Burtis Junes, NP            Just wanted to send you a FYI       ----- Message -----         From: Tarri Glenn Deal         Sent: 07/21/2014   5:38 PM           To: Delynn Flavin,            I wanted to make sure you followed up on the Nuclear study done on Ms. Mccardle this week. It was ordered by Cecille Rubin, it appears you input the appt, but no order was in place, so for me to finalize everything, I ordered it under Dr. Acie Fredrickson, so he received the results, but Cecille Rubin didn't. From what I can tell, this pt was a long time Dr. Verl Blalock pt and may not have ever seen Dr. Acie Fredrickson. Just wanted to make sure someone followed up on the study, since Lori's been in [facility] this week.            Thanks,      Mariann Laster             ------

## 2014-07-25 NOTE — Telephone Encounter (Signed)
Pt aware of stress test results.

## 2014-08-12 DIAGNOSIS — M5412 Radiculopathy, cervical region: Secondary | ICD-10-CM | POA: Diagnosis not present

## 2014-08-12 DIAGNOSIS — I10 Essential (primary) hypertension: Secondary | ICD-10-CM | POA: Diagnosis not present

## 2014-08-12 DIAGNOSIS — R072 Precordial pain: Secondary | ICD-10-CM | POA: Diagnosis not present

## 2014-08-12 DIAGNOSIS — M545 Low back pain, unspecified: Secondary | ICD-10-CM | POA: Diagnosis not present

## 2014-08-12 DIAGNOSIS — E039 Hypothyroidism, unspecified: Secondary | ICD-10-CM | POA: Diagnosis not present

## 2014-08-12 DIAGNOSIS — F329 Major depressive disorder, single episode, unspecified: Secondary | ICD-10-CM | POA: Diagnosis not present

## 2014-08-12 DIAGNOSIS — F3289 Other specified depressive episodes: Secondary | ICD-10-CM | POA: Diagnosis not present

## 2014-08-12 DIAGNOSIS — R109 Unspecified abdominal pain: Secondary | ICD-10-CM | POA: Diagnosis not present

## 2014-08-17 ENCOUNTER — Encounter: Payer: Self-pay | Admitting: Gastroenterology

## 2014-08-17 ENCOUNTER — Telehealth: Payer: Self-pay | Admitting: Gastroenterology

## 2014-08-17 NOTE — Telephone Encounter (Signed)
Spoke with the patient. She was seen in the [facility] for chest pain. She was given a GI cocktail with relief of her sx's. She was instructed to call for an appointment to evaluate. Patient is Nexium. She states she has not called her PCP to give them this information. She will continue her PPI. Appointment scheduled with Alonza Bogus.

## 2014-08-23 ENCOUNTER — Encounter: Payer: Self-pay | Admitting: *Deleted

## 2014-08-29 ENCOUNTER — Encounter: Payer: Self-pay | Admitting: Gastroenterology

## 2014-08-29 ENCOUNTER — Ambulatory Visit (INDEPENDENT_AMBULATORY_CARE_PROVIDER_SITE_OTHER): Payer: Medicare Other | Admitting: Gastroenterology

## 2014-08-29 VITALS — BP 120/70 | HR 68

## 2014-08-29 DIAGNOSIS — I2510 Atherosclerotic heart disease of native coronary artery without angina pectoris: Secondary | ICD-10-CM | POA: Diagnosis not present

## 2014-08-29 DIAGNOSIS — R0789 Other chest pain: Secondary | ICD-10-CM | POA: Diagnosis not present

## 2014-08-29 DIAGNOSIS — K625 Hemorrhage of anus and rectum: Secondary | ICD-10-CM | POA: Diagnosis not present

## 2014-08-29 DIAGNOSIS — R109 Unspecified abdominal pain: Secondary | ICD-10-CM | POA: Insufficient documentation

## 2014-08-29 DIAGNOSIS — Z800 Family history of malignant neoplasm of digestive organs: Secondary | ICD-10-CM | POA: Diagnosis not present

## 2014-08-29 DIAGNOSIS — K5900 Constipation, unspecified: Secondary | ICD-10-CM

## 2014-08-29 DIAGNOSIS — R1013 Epigastric pain: Secondary | ICD-10-CM | POA: Diagnosis not present

## 2014-08-29 HISTORY — DX: Constipation, unspecified: K59.00

## 2014-08-29 HISTORY — DX: Hemorrhage of anus and rectum: K62.5

## 2014-08-29 MED ORDER — HYDROCORTISONE 2.5 % EX CREA: TOPICAL_CREAM | Freq: Two times a day (BID) | CUTANEOUS | Status: DC

## 2014-08-29 MED ORDER — NA SULFATE-K SULFATE-MG SULF 17.5-3.13-1.6 GM/177ML PO SOLN: ORAL | Status: DC

## 2014-08-29 NOTE — Progress Notes (Signed)
     08/29/2014 [name] [name] [id number] [birth year]   History of Present Illness:  This is a pleasant 80 year old female who is previously known to Dr. Deatra Ina.  Her last colonoscopy was in January 2011 at which time she was found to have only diverticulosis in the sigmoid colon. She gets repeat colonoscopies at 5 year intervals due to family history of colon cancer in her mother. Her last EGD was in October 2007 at which time it was normal.  She presents to our office today with regards to substernal chest pain/epigastric abdominal pain. She's been evaluated from a cardiology standpoint and they have said that it is noncardiac in origin. She tells me that the pain has been present intermittently for approximately 6 months. It is associated with some shortness of breath and radiates around to her left upper quadrant over the left side of her chest. It does seem to come on more with walking and exertion. She does not have the pain on a daily basis. She describes it sometimes as a burning sensation. She does note that she gets hiccups on occasion. She previously had been on Nexium 40 mg daily, but stopped that quite a while ago although she is unsure exactly when it was discontinued. She did restart that again about one month ago, however.  While she's here she also reports that she has constipation for which she takes a daily fiber supplement. She feels that it may not be working well for her anymore and she should maybe try something new or different. She does have hemorrhoids and reports that she has seen blood with her bowel movements on occasion. She has used Preparation H for hemorrhoids in the past without great success. She also reports some intermittent right-sided abdominal pain that radiates into her back at times. That is random as well and not necessarily associated with eating or with any type of pattern.  Recent CBC, BMP, TSH, urinalysis were all unremarkable.  Hpylori study was negative.  She  denies NSAID use.   Current Medications, Allergies, Past Medical History, Past Surgical History, Family History and Social History were reviewed in [employer] record.   Physical Exam: BP 120/70  Pulse 68 General: Well developed white female in no acute distress Head: Normocephalic and atraumatic Eyes:  Sclerae anicteric, conjunctiva pink  Ears: Normal auditory acuity Lungs: Clear throughout to auscultation Heart: Regular rate and rhythm Abdomen: Soft, non-distended.  Normal bowel sounds.  Mild epigastric and LLQ TTP without R/R/G. Rectal:  External hemorrhoids noted.  No masses felt on DRE.  No stool noted on exam glove. Musculoskeletal: Symmetrical with no gross deformities  Extremities: No edema  Neurological: Alert oriented x 4, grossly non-focal Psychological:  Alert and cooperative. Normal mood and affect  Assessment and Recommendations: -Atypical/non-cardiac chest pain, epigastric abdominal pain:  Deemed non-cardiac after extensive evaluation.  ? Reflux related.  Recently restarted Nexium 40 mg daily and will continue that for now. -Constipation:  Will start Miralax daily. -Rectal bleeding:  Most likely secondary to hemorrhoids aggravated by constipation, but with family history of colon cancer we will repeat colonoscopy a few months early.  Will give hydrocortisone suppositories to use BID for 7-10 days at a time. -Family history of colon cancer in her mother:  Due for colonoscopy recall in 12/2014.

## 2014-08-29 NOTE — Patient Instructions (Addendum)
You have been scheduled for an endoscopy and colonoscopy with Dr. Deatra Ina. Please follow the written instructions given to you at your visit today. Please pick up your prep at the pharmacy within the next 1-3 days. If you use inhalers (even only as needed), please bring them with you on the day of your procedure. Your physician has requested that you go to www.startemmi.com and enter the access code given to you at your visit today. This web site gives a general overview about your procedure. However, you should still follow specific instructions given to you by our office regarding your preparation for the procedure.  We have sent the following medications to your pharmacy for you to pick up at your convenience: Hydrocortisone Cream, apply twice daily for seven days   Please purchase Miralax over the counter and take as directed once daily   Continue Nexium

## 2014-08-30 NOTE — Progress Notes (Signed)
Reviewed and agree with management.  If chest pain continues would proceed with upper endoscopy Sandy Salaam. Deatra Ina, M.D., [facility]

## 2014-09-01 DIAGNOSIS — Z23 Encounter for immunization: Secondary | ICD-10-CM | POA: Diagnosis not present

## 2014-09-01 DIAGNOSIS — Z0000 Encounter for general adult medical examination without abnormal findings: Secondary | ICD-10-CM | POA: Diagnosis not present

## 2014-09-01 DIAGNOSIS — M810 Age-related osteoporosis without current pathological fracture: Secondary | ICD-10-CM | POA: Diagnosis not present

## 2014-09-02 ENCOUNTER — Other Ambulatory Visit: Payer: Self-pay | Admitting: Family Medicine

## 2014-09-02 DIAGNOSIS — M810 Age-related osteoporosis without current pathological fracture: Secondary | ICD-10-CM

## 2014-09-09 ENCOUNTER — Ambulatory Visit
Admission: RE | Admit: 2014-09-09 | Discharge: 2014-09-09 | Disposition: A | Discharge status: Home / Self Care | Payer: Medicare Other | Source: Ambulatory Visit | Attending: Family Medicine | Admitting: Family Medicine

## 2014-09-09 DIAGNOSIS — M949 Disorder of cartilage, unspecified: Secondary | ICD-10-CM | POA: Diagnosis not present

## 2014-09-09 DIAGNOSIS — M810 Age-related osteoporosis without current pathological fracture: Secondary | ICD-10-CM

## 2014-09-09 DIAGNOSIS — M899 Disorder of bone, unspecified: Secondary | ICD-10-CM | POA: Diagnosis not present

## 2014-09-20 DIAGNOSIS — Z961 Presence of intraocular lens: Secondary | ICD-10-CM | POA: Diagnosis not present

## 2014-09-20 DIAGNOSIS — H1045 Other chronic allergic conjunctivitis: Secondary | ICD-10-CM | POA: Diagnosis not present

## 2014-09-20 DIAGNOSIS — H04129 Dry eye syndrome of unspecified lacrimal gland: Secondary | ICD-10-CM | POA: Diagnosis not present

## 2014-09-20 DIAGNOSIS — H43819 Vitreous degeneration, unspecified eye: Secondary | ICD-10-CM | POA: Diagnosis not present

## 2014-10-07 ENCOUNTER — Ambulatory Visit (AMBULATORY_SURGERY_CENTER): Payer: Medicare Other | Admitting: Gastroenterology

## 2014-10-07 ENCOUNTER — Encounter: Payer: Self-pay | Admitting: Gastroenterology

## 2014-10-07 VITALS — BP 149/78 | HR 66 | Temp 98.1°F | Resp 16 | Ht 64.0 in | Wt 168.0 lb

## 2014-10-07 DIAGNOSIS — K625 Hemorrhage of anus and rectum: Secondary | ICD-10-CM

## 2014-10-07 DIAGNOSIS — D123 Benign neoplasm of transverse colon: Secondary | ICD-10-CM

## 2014-10-07 DIAGNOSIS — K648 Other hemorrhoids: Secondary | ICD-10-CM | POA: Diagnosis not present

## 2014-10-07 DIAGNOSIS — R109 Unspecified abdominal pain: Secondary | ICD-10-CM | POA: Diagnosis not present

## 2014-10-07 DIAGNOSIS — D126 Benign neoplasm of colon, unspecified: Secondary | ICD-10-CM | POA: Diagnosis not present

## 2014-10-07 DIAGNOSIS — Z800 Family history of malignant neoplasm of digestive organs: Secondary | ICD-10-CM | POA: Diagnosis not present

## 2014-10-07 DIAGNOSIS — K2950 Unspecified chronic gastritis without bleeding: Secondary | ICD-10-CM | POA: Diagnosis present

## 2014-10-07 DIAGNOSIS — I10 Essential (primary) hypertension: Secondary | ICD-10-CM | POA: Diagnosis not present

## 2014-10-07 DIAGNOSIS — I2510 Atherosclerotic heart disease of native coronary artery without angina pectoris: Secondary | ICD-10-CM | POA: Diagnosis not present

## 2014-10-07 DIAGNOSIS — E039 Hypothyroidism, unspecified: Secondary | ICD-10-CM | POA: Diagnosis not present

## 2014-10-07 MED ORDER — SODIUM CHLORIDE 0.9 % IV SOLN: 500.0000 mL | INTRAVENOUS | Status: DC

## 2014-10-07 NOTE — Op Note (Signed)
[facility]  [address] [place], [address]   COLONOSCOPY PROCEDURE REPORT  PATIENT: [name], [name]  MR#: [id number] BIRTHDATE: Dec 18, 1933 , 81  yrs. old GENDER: female ENDOSCOPIST: Inda Castle, MD REFERRED BY: PROCEDURE DATE:  10/07/2014 PROCEDURE:   Colonoscopy with snare polypectomy First Screening Colonoscopy - Avg.  risk and is 50 yrs.  old or older - No.  Prior Negative Screening - Now for repeat screening. Above average risk Prior Negative Screening - Now for repeat screening.  Other: See Comments  History of Adenoma - Now for follow-up colonoscopy & has been > or = to 3 yrs.  N/A  Polyps Removed Today? Yes. ASA CLASS:   Class II INDICATIONS:rectal bleeding and patient's immediate family history of colon cancer. MEDICATIONS: Monitored anesthesia care and Propofol 260 mg IV  DESCRIPTION OF PROCEDURE:   After the risks benefits and alternatives of the procedure were thoroughly explained, informed consent was obtained.  The digital rectal exam revealed hemorrhoids, Grade III.   The [id number]  endoscope was introduced through the anus and advanced to the cecum, which was identified by both the appendix and ileocecal valve. No adverse events experienced.   The quality of the prep was Suprep good  The instrument was then slowly withdrawn as the colon was fully examined.      COLON FINDINGS: A sessile polyp measuring 4 mm in size was found in the proximal transverse colon.  A polypectomy was performed with a cold snare.   A flat polyp measuring 3 mm in size was found in the distal transverse colon.  A polypectomy was performed with a cold snare.  The resection was complete, the polyp tissue was completely retrieved and sent to histology.   Internal Grade III hemorrhoids were found.   A sessile polyp measuring 4 mm in size was found in the proximal transverse colon.  A polypectomy was performed with a cold snare.  The resection was  complete, the polyp tissue was completely retrieved and sent to histology.   There was mild diverticulosis noted in the sigmoid colon.  Retroflexed views revealed no abnormalities. The time to cecum=5 minutes 06 seconds. Withdrawal time=9 minutes 16 seconds.  The scope was withdrawn and the procedure completed. COMPLICATIONS: There were no immediate complications.  ENDOSCOPIC IMPRESSION: 1.   Sessile polyp measuring 4 mm in size was found in the proximal transverse colon; polypectomy was performed with a cold snare 2.   Flat polyp measuring 3 mm in size was found in the distal transverse colon; polypectomy was performed with a cold snare 3.   Internal Grade III hemorrhoids 4.   Sessile polyp measuring 4 mm in size was found in the proximal transverse colon; polypectomy was performed with a cold snare 5.   Mild diverticulosis was noted in the sigmoid colon  RECOMMENDATIONS: Given your age, you will not need another colonoscopy for colon cancer screening or polyp surveillance.  These types of tests usually stop around the age 28. Band ligation of bleeding hemorrhoids  eSigned:  Inda Castle, MD 10/07/2014 3:41 PM   cc: Antony Blackbird, MD   PATIENT NAME:  [name], [name] MR#: [id number]

## 2014-10-07 NOTE — Progress Notes (Signed)
Prtocedure ends, to recovery, report given and VSS.

## 2014-10-07 NOTE — Patient Instructions (Signed)
HANDOUTS GIVEN :HEMORROIDS, POLYPS, DIVERTICULOSIS, HIGH FIBER DIET AND [place], FOR NON SURGICAL BANDING.   YOU HAD AN ENDOSCOPIC PROCEDURE TODAY AT [facility]: Refer to the procedure report that was given to you for any specific questions about what was found during the examination.  If the procedure report does not answer your questions, please call your gastroenterologist to clarify.  If you requested that your care partner not be given the details of your procedure findings, then the procedure report has been included in a sealed envelope for you to review at your convenience later.  YOU SHOULD EXPECT: Some feelings of bloating in the abdomen. Passage of more gas than usual.  Walking can help get rid of the air that was put into your GI tract during the procedure and reduce the bloating. If you had a lower endoscopy (such as a colonoscopy or flexible sigmoidoscopy) you may notice spotting of blood in your stool or on the toilet paper. If you underwent a bowel prep for your procedure, then you may not have a normal bowel movement for a few days.  DIET: Your first meal following the procedure should be a light meal and then it is ok to progress to your normal diet.  A half-sandwich or bowl of soup is an example of a good first meal.  Heavy or fried foods are harder to digest and may make you feel nauseous or bloated.  Likewise meals heavy in dairy and vegetables can cause extra gas to form and this can also increase the bloating.  Drink plenty of fluids but you should avoid alcoholic beverages for 24 hours.  ACTIVITY: Your care partner should take you home directly after the procedure.  You should plan to take it easy, moving slowly for the rest of the day.  You can resume normal activity the day after the procedure however you should NOT DRIVE or use heavy machinery for 24 hours (because of the sedation medicines used during the test).    SYMPTOMS TO REPORT IMMEDIATELY: A  gastroenterologist can be reached at any hour.  During normal business hours, 8:30 AM to 5:00 PM Monday through Friday, call [phone].  After hours and on weekends, please call the GI answering service at [phone] who will take a message and have the physician on call contact you.   Following lower endoscopy (colonoscopy or flexible sigmoidoscopy):  Excessive amounts of blood in the stool  Significant tenderness or worsening of abdominal pains  Swelling of the abdomen that is new, acute  Fever of 100F or higher  Following upper endoscopy (EGD)  Vomiting of blood or coffee ground material  New chest pain or pain under the shoulder blades  Painful or persistently difficult swallowing  New shortness of breath  Fever of 100F or higher  Black, tarry-looking stools  FOLLOW UP: If any biopsies were taken you will be contacted by phone or by letter within the next 1-3 weeks.  Call your gastroenterologist if you have not heard about the biopsies in 3 weeks.  Our staff will call the home number listed on your records the next business day following your procedure to check on you and address any questions or concerns that you may have at that time regarding the information given to you following your procedure. This is a courtesy call and so if there is no answer at the home number and we have not heard from you through the emergency physician on call, we  will assume that you have returned to your regular daily activities without incident.  SIGNATURES/CONFIDENTIALITY: You and/or your care partner have signed paperwork which will be entered into your electronic medical record.  These signatures attest to the fact that that the information above on your After Visit Summary has been reviewed and is understood.  Full responsibility of the confidentiality of this discharge information lies with you and/or your care-partner.

## 2014-10-07 NOTE — Progress Notes (Signed)
Called to room to assist during endoscopic procedure.  Patient ID and intended procedure confirmed with present staff. Received instructions for my participation in the procedure from the performing physician.  

## 2014-10-07 NOTE — Op Note (Signed)
[place]  [address] [place], [id number]   ENDOSCOPY PROCEDURE REPORT  PATIENT: [name], [name]  MR#: [id number] BIRTHDATE: 09/08/1933 , 81  yrs. old GENDER: female ENDOSCOPIST: Inda Castle, MD REFERRED BY: PROCEDURE DATE:  10/07/2014 PROCEDURE:  EGD w/ biopsy ASA CLASS:     Class II INDICATIONS:  chest pain. MEDICATIONS: Residual sedation present, Monitored anesthesia care, Propofol 160 mg IV, and Glycopyrrolate (Robinul) 0.2 mg IV TOPICAL ANESTHETIC:  DESCRIPTION OF PROCEDURE: After the risks benefits and alternatives of the procedure were thoroughly explained, informed consent was obtained.  The LB [id number] endoscope was introduced through the mouth and advanced to the second portion of the duodenum , Without limitations.  The instrument was slowly withdrawn as the mucosa was fully examined.    STOMACH: Chronic gastritis (inflammation) was found on the greater curvature of the gastric body and in the gastric antrum.  There were erosions present.  A biopsy was performed.   Except for the findings listed the EGD was otherwise normal.  Retroflexed views revealed no abnormalities.     The scope was then withdrawn from the patient and the procedure completed.  COMPLICATIONS: There were no immediate complications.  ENDOSCOPIC IMPRESSION: 1.   Chronic gastritis (inflammation) was found on the greater curvature of the gastric body and in the gastric antrum; biopsy was performed 2.   EGD was otherwise normal  RECOMMENDATIONS: 1.  Await biopsy results 2.  Continue current meds   (chest pain has resolved)  REPEAT EXAM:  eSigned:  Inda Castle, MD 10/07/2014 3:56 PM    CC: Antony Blackbird, MD

## 2014-10-07 NOTE — Progress Notes (Signed)
PATIENT TO RESTROOM PRIOR TO DISCHARGE. PATIENT HAD PALE BLOODY TINGED ON WIPING. PATIENT SAVING WIPE FOR THIS NURSE TO EXAM. NO CLOTS OR ACTIVE BLEEDING. DISCHARGE INSTRUCTIONS REVIEWED REGARDING RECTAL BLEEDING. PATIENT VERBALIZED UNDERSTANDING.

## 2014-10-10 ENCOUNTER — Telehealth: Payer: Self-pay

## 2014-10-10 NOTE — Telephone Encounter (Signed)
  Follow up Call-  Call back number 10/07/2014  Post procedure Call Back phone  # [phone]  Permission to leave phone message Yes     Patient questions:  Do you have a fever, pain , or abdominal swelling? No. Pain Score  0 *  Have you tolerated food without any problems? Yes.    Have you been able to return to your normal activities? Yes.    Do you have any questions about your discharge instructions: Diet   No. Medications  No. Follow up visit  No.  Do you have questions or concerns about your Care? No.  Actions: * If pain score is 4 or above: No action needed, pain <4.

## 2014-10-12 ENCOUNTER — Encounter: Payer: Self-pay | Admitting: Gastroenterology

## 2014-10-13 ENCOUNTER — Encounter: Payer: Self-pay | Admitting: Cardiology

## 2014-10-19 ENCOUNTER — Encounter: Payer: Self-pay | Admitting: Gastroenterology

## 2014-10-19 ENCOUNTER — Ambulatory Visit (INDEPENDENT_AMBULATORY_CARE_PROVIDER_SITE_OTHER): Payer: Medicare Other | Admitting: Gastroenterology

## 2014-10-19 VITALS — BP 140/74 | HR 60 | Ht 64.5 in | Wt 164.0 lb

## 2014-10-19 DIAGNOSIS — K649 Unspecified hemorrhoids: Secondary | ICD-10-CM | POA: Diagnosis not present

## 2014-10-19 DIAGNOSIS — K648 Other hemorrhoids: Secondary | ICD-10-CM

## 2014-10-19 NOTE — Patient Instructions (Signed)
HEMORRHOID BANDING PROCEDURE    FOLLOW-UP CARE   1. The procedure you have had should have been relatively painless since the banding of the area involved does not have nerve endings and there is no pain sensation.  The rubber band cuts off the blood supply to the hemorrhoid and the band may fall off as soon as 48 hours after the banding (the band may occasionally be seen in the toilet bowl following a bowel movement). You may notice a temporary feeling of fullness in the rectum which should respond adequately to plain Tylenol or Motrin.  2. Following the banding, avoid strenuous exercise that evening and resume full activity the next day.  A sitz bath (soaking in a warm tub) or bidet is soothing, and can be useful for cleansing the area after bowel movements.     3. To avoid constipation, take two tablespoons of natural wheat bran, natural oat bran, flax, Benefiber or any over the counter fiber supplement and increase your water intake to 7-8 glasses daily.    4. Unless you have been prescribed anorectal medication, do not put anything inside your rectum for two weeks: No suppositories, enemas, fingers, etc.  5. Occasionally, you may have more bleeding than usual after the banding procedure.  This is often from the untreated hemorrhoids rather than the treated one.  Don't be concerned if there is a tablespoon or so of blood.  If there is more blood than this, lie flat with your bottom higher than your head and apply an ice pack to the area. If the bleeding does not stop within a half an hour or if you feel faint, call our office at [phone] or go to the emergency room.  6. Problems are not common; however, if there is a substantial amount of bleeding, severe pain, chills, fever or difficulty passing urine (very rare) or other problems, you should call us at [phone] or report to the nearest emergency room.  7. Do not stay seated continuously for more than 2-3 hours for a day or two  after the procedure.  Tighten your buttock muscles 10-15 times every two hours and take 10-15 deep breaths every 1-2 hours.  Do not spend more than a few minutes on the toilet if you cannot empty your bowel; instead re-visit the toilet at a later time.  Your 2nd banding is scheduled 12/07/2014 9:15am

## 2014-10-19 NOTE — Progress Notes (Signed)
PROCEDURE NOTE: The patient presents with symptomatic grade *2**  hemorrhoids, requesting rubber band ligation of his/her hemorrhoidal disease.  All risks, benefits and alternative forms of therapy were described and informed consent was obtained.   The anorectum was pre-medicated with lubricant and nitroglycerine ointment The decision was made to band the *right posterior** internal hemorrhoid, and the [facility] O'Regan System was used to perform band ligation without complication.  Digital anorectal examination was then performed to assure proper positioning of the band, and to adjust the banded tissue as required.  The patient was discharged home without pain or other issues.  Dietary and behavioral recommendations were given and along with follow-up instructions.    The patient will return in **2 * weeks for  follow-up and possible additional banding as required. No complications were encountered and the patient tolerated the procedure well.   

## 2014-11-18 ENCOUNTER — Ambulatory Visit
Admission: RE | Admit: 2014-11-18 | Discharge: 2014-11-18 | Disposition: A | Discharge status: Home / Self Care | Payer: Medicare Other | Source: Ambulatory Visit | Attending: Family Medicine | Admitting: Family Medicine

## 2014-11-18 ENCOUNTER — Other Ambulatory Visit: Payer: Self-pay | Admitting: Family Medicine

## 2014-11-18 DIAGNOSIS — L03115 Cellulitis of right lower limb: Secondary | ICD-10-CM | POA: Diagnosis not present

## 2014-11-18 DIAGNOSIS — M25561 Pain in right knee: Secondary | ICD-10-CM

## 2014-11-18 DIAGNOSIS — S51009A Unspecified open wound of unspecified elbow, initial encounter: Secondary | ICD-10-CM | POA: Diagnosis not present

## 2014-11-18 DIAGNOSIS — S8991XA Unspecified injury of right lower leg, initial encounter: Secondary | ICD-10-CM | POA: Diagnosis not present

## 2014-11-18 DIAGNOSIS — M2540 Effusion, unspecified joint: Secondary | ICD-10-CM

## 2014-11-23 DIAGNOSIS — M25561 Pain in right knee: Secondary | ICD-10-CM | POA: Diagnosis not present

## 2014-11-23 DIAGNOSIS — M25562 Pain in left knee: Secondary | ICD-10-CM | POA: Diagnosis not present

## 2014-12-07 ENCOUNTER — Ambulatory Visit (INDEPENDENT_AMBULATORY_CARE_PROVIDER_SITE_OTHER): Payer: Medicare Other | Admitting: Gastroenterology

## 2014-12-07 ENCOUNTER — Encounter: Payer: Self-pay | Admitting: Gastroenterology

## 2014-12-07 VITALS — BP 124/76 | HR 68 | Ht 64.5 in | Wt 168.0 lb

## 2014-12-07 DIAGNOSIS — K648 Other hemorrhoids: Secondary | ICD-10-CM | POA: Diagnosis not present

## 2014-12-07 NOTE — Patient Instructions (Signed)
HEMORRHOID BANDING PROCEDURE    FOLLOW-UP CARE   1. The procedure you have had should have been relatively painless since the banding of the area involved does not have nerve endings and there is no pain sensation.  The rubber band cuts off the blood supply to the hemorrhoid and the band may fall off as soon as 48 hours after the banding (the band may occasionally be seen in the toilet bowl following a bowel movement). You may notice a temporary feeling of fullness in the rectum which should respond adequately to plain Tylenol or Motrin.  2. Following the banding, avoid strenuous exercise that evening and resume full activity the next day.  A sitz bath (soaking in a warm tub) or bidet is soothing, and can be useful for cleansing the area after bowel movements.     3. To avoid constipation, take two tablespoons of natural wheat bran, natural oat bran, flax, Benefiber or any over the counter fiber supplement and increase your water intake to 7-8 glasses daily.    4. Unless you have been prescribed anorectal medication, do not put anything inside your rectum for two weeks: No suppositories, enemas, fingers, etc.  5. Occasionally, you may have more bleeding than usual after the banding procedure.  This is often from the untreated hemorrhoids rather than the treated one.  Don't be concerned if there is a tablespoon or so of blood.  If there is more blood than this, lie flat with your bottom higher than your head and apply an ice pack to the area. If the bleeding does not stop within a half an hour or if you feel faint, call our office at [phone] or go to the emergency room.  6. Problems are not common; however, if there is a substantial amount of bleeding, severe pain, chills, fever or difficulty passing urine (very rare) or other problems, you should call us at [phone] or report to the nearest emergency room.  7. Do not stay seated continuously for more than 2-3 hours for a day or two  after the procedure.  Tighten your buttock muscles 10-15 times every two hours and take 10-15 deep breaths every 1-2 hours.  Do not spend more than a few minutes on the toilet if you cannot empty your bowel; instead re-visit the toilet at a later time.   Your 3rd banding is scheduled on 02/03/2015 at 8:30am

## 2014-12-07 NOTE — Progress Notes (Signed)
PROCEDURE NOTE: The patient presents with symptomatic grade *2**  hemorrhoids, requesting rubber band ligation of his/her hemorrhoidal disease.  All risks, benefits and alternative forms of therapy were described and informed consent was obtained.   The anorectum was pre-medicated with lubricant and nitroglycerine ointment The decision was made to band the *right anterior** internal hemorrhoid, and the [facility] O'Regan System was used to perform band ligation without complication.  Digital anorectal examination was then performed to assure proper positioning of the band, and to adjust the banded tissue as required.  The patient was discharged home without pain or other issues.  Dietary and behavioral recommendations were given and along with follow-up instructions.    The patient will return in *2** weeks for  follow-up and possible additional banding as required. No complications were encountered and the patient tolerated the procedure well.   

## 2015-02-02 DIAGNOSIS — E785 Hyperlipidemia, unspecified: Secondary | ICD-10-CM | POA: Diagnosis not present

## 2015-02-02 DIAGNOSIS — I10 Essential (primary) hypertension: Secondary | ICD-10-CM | POA: Diagnosis not present

## 2015-02-02 DIAGNOSIS — E559 Vitamin D deficiency, unspecified: Secondary | ICD-10-CM | POA: Diagnosis not present

## 2015-02-02 DIAGNOSIS — M8580 Other specified disorders of bone density and structure, unspecified site: Secondary | ICD-10-CM | POA: Diagnosis not present

## 2015-02-02 DIAGNOSIS — I2510 Atherosclerotic heart disease of native coronary artery without angina pectoris: Secondary | ICD-10-CM | POA: Diagnosis not present

## 2015-02-02 DIAGNOSIS — K219 Gastro-esophageal reflux disease without esophagitis: Secondary | ICD-10-CM | POA: Diagnosis not present

## 2015-02-02 DIAGNOSIS — R1030 Lower abdominal pain, unspecified: Secondary | ICD-10-CM | POA: Diagnosis not present

## 2015-02-02 DIAGNOSIS — I079 Rheumatic tricuspid valve disease, unspecified: Secondary | ICD-10-CM | POA: Diagnosis not present

## 2015-02-02 DIAGNOSIS — E039 Hypothyroidism, unspecified: Secondary | ICD-10-CM | POA: Diagnosis not present

## 2015-02-02 DIAGNOSIS — R5383 Other fatigue: Secondary | ICD-10-CM | POA: Diagnosis not present

## 2015-02-03 ENCOUNTER — Encounter: Payer: Self-pay | Admitting: Gastroenterology

## 2015-02-03 ENCOUNTER — Ambulatory Visit (INDEPENDENT_AMBULATORY_CARE_PROVIDER_SITE_OTHER): Payer: Medicare Other | Admitting: Gastroenterology

## 2015-02-03 ENCOUNTER — Encounter: Payer: Medicare Other | Admitting: Gastroenterology

## 2015-02-03 VITALS — BP 128/70 | HR 60 | Ht 64.5 in | Wt 173.4 lb

## 2015-02-03 DIAGNOSIS — K648 Other hemorrhoids: Secondary | ICD-10-CM

## 2015-02-03 DIAGNOSIS — K641 Second degree hemorrhoids: Secondary | ICD-10-CM | POA: Diagnosis not present

## 2015-02-03 NOTE — Patient Instructions (Signed)
HEMORRHOID BANDING PROCEDURE    FOLLOW-UP CARE   1. The procedure you have had should have been relatively painless since the banding of the area involved does not have nerve endings and there is no pain sensation.  The rubber band cuts off the blood supply to the hemorrhoid and the band may fall off as soon as 48 hours after the banding (the band may occasionally be seen in the toilet bowl following a bowel movement). You may notice a temporary feeling of fullness in the rectum which should respond adequately to plain Tylenol or Motrin.  2. Following the banding, avoid strenuous exercise that evening and resume full activity the next day.  A sitz bath (soaking in a warm tub) or bidet is soothing, and can be useful for cleansing the area after bowel movements.     3. To avoid constipation, take two tablespoons of natural wheat bran, natural oat bran, flax, Benefiber or any over the counter fiber supplement and increase your water intake to 7-8 glasses daily.    4. Unless you have been prescribed anorectal medication, do not put anything inside your rectum for two weeks: No suppositories, enemas, fingers, etc.  5. Occasionally, you may have more bleeding than usual after the banding procedure.  This is often from the untreated hemorrhoids rather than the treated one.  Don't be concerned if there is a tablespoon or so of blood.  If there is more blood than this, lie flat with your bottom higher than your head and apply an ice pack to the area. If the bleeding does not stop within a half an hour or if you feel faint, call our office at [phone] or go to the emergency room.  6. Problems are not common; however, if there is a substantial amount of bleeding, severe pain, chills, fever or difficulty passing urine (very rare) or other problems, you should call us at [phone] or report to the nearest emergency room.  7. Do not stay seated continuously for more than 2-3 hours for a day or two  after the procedure.  Tighten your buttock muscles 10-15 times every two hours and take 10-15 deep breaths every 1-2 hours.  Do not spend more than a few minutes on the toilet if you cannot empty your bowel; instead re-visit the toilet at a later time.   Your follow up appointment with Dr [name] [name] is on 03/22/2015 at 1:45pm

## 2015-02-03 NOTE — Progress Notes (Signed)
PROCEDURE NOTE: The patient presents with symptomatic grade *2**  hemorrhoids, requesting rubber band ligation of his/her hemorrhoidal disease.  All risks, benefits and alternative forms of therapy were described and informed consent was obtained.   The anorectum was pre-medicated with lubricant and nitroglycerine ointment The decision was made to band the *left lateral** internal hemorrhoid, and the Megargel was used to perform band ligation without complication.  Digital anorectal examination was then performed to assure proper positioning of the band, and to adjust the banded tissue as required.  The patient was discharged home without pain or other issues.  Dietary and behavioral recommendations were given and along with follow-up instructions.    The patient will return in *4** weeks for  follow-up and possible additional banding as required. No complications were encountered and the patient tolerated the procedure well.

## 2015-02-08 ENCOUNTER — Encounter: Payer: Self-pay | Admitting: Nurse Practitioner

## 2015-02-08 ENCOUNTER — Ambulatory Visit (INDEPENDENT_AMBULATORY_CARE_PROVIDER_SITE_OTHER): Payer: Medicare Other | Admitting: Nurse Practitioner

## 2015-02-08 VITALS — BP 140/80 | HR 70 | Ht 64.0 in | Wt 168.0 lb

## 2015-02-08 DIAGNOSIS — E785 Hyperlipidemia, unspecified: Secondary | ICD-10-CM | POA: Diagnosis not present

## 2015-02-08 DIAGNOSIS — I10 Essential (primary) hypertension: Secondary | ICD-10-CM | POA: Diagnosis not present

## 2015-02-08 DIAGNOSIS — I2510 Atherosclerotic heart disease of native coronary artery without angina pectoris: Secondary | ICD-10-CM

## 2015-02-08 NOTE — Patient Instructions (Addendum)
Stay on your current medicines  Stay active  See me in 1 year - arrive at 1:15 pm for a 1:30 pm OV on February 10th, 2017  Call the [place] office at [phone] if you have any questions, problems or concerns.

## 2015-02-08 NOTE — Progress Notes (Signed)
CARDIOLOGY OFFICE NOTE  Date:  02/08/2015    [name] [name] Date of Birth: January 19, 1933 Medical Record #[id number]  PCP:  Antony Blackbird, MD  Cardiologist:  Wall/Devoiry Corriher  Chief Complaint  Patient presents with  . Coronary Artery Disease    6 month check - former patient of Dr. Winnifred Friar  . Hypertension   History of Present Illness: [name] [name] is a 81 y.o. female who presents today for a 6 month check. She is a former patient of Dr. Winnifred Friar. She has CAD and HTN.  Other issues include OA, GERD, HLD, anxiety, depression and hypothyroidism.   I had seen her in July of 2014 - she was contemplating knee replacement - did not have - said she was told that it was felt to be "too risky".   I saw her back in March of 2015 after a spell of presyncope associated with a sensation of being "pulled to the left" and having to hold on to a door so she would not fall. She also endorsed feeling more short of breath and having more swelling. Murmur noted on exam. Concern was for possible TIA. We restarted her aspirin and obtained an echo, CT of the head and labs. She had had recent carotid dopplers. Echo with normal EF, severe TR and an atrial septal aneurysm - discussed with Dr. Acie Fredrickson who felt that this was typically a benign finding unless there was concern for stroke - then would need to consider bubble study. When I saw her in April she was doing ok. No more spells that sounded like TIA/CVA. Wanted to take a "watch and wait approach" in regards to bubble study and thus it was not performed.   Last seen by me back in July - has preferred to continue her follow up with me. She was having some chest pain - Myoview was updated. This was ok.  Comes back today. Here alone. Has multiple issues. Her cardiac status seems stable. No chest pain. She does get fatigued. Spends more of the visit talking about her husband's issues. Has had rectal bleeding and has had her hemorrhoids banded a couple of times. BP ok at  home. Says her labs are checked by PCP -  Dr. Chapman Fitch with [facility]. Was treated for UTI back in December - does not look like she finished her course of Keflex - this was discarded here today - looks very close to her Verapamil.    Past Medical History  Diagnosis Date  . Coronary artery disease   . Hyperlipidemia   . Orthostatic hypotension   . Hypertension   . Osteoarthrosis, unspecified whether generalized or localized, unspecified site   . Family history of malignant neoplasm of gastrointestinal tract   . Allergic rhinitis, cause unspecified   . Unspecified functional disorder of intestine   . H/O: hysterectomy   . Osteoporosis   . Anxiety and depression   . GERD (gastroesophageal reflux disease)   . Hypothyroidism   . Chronic headaches     Past Surgical History  Procedure Laterality Date  . Cardiac catheterization    . Total knee arthroplasty Right   . Nasal sinus surgery    . Tonsillectomy    . Appendectomy    . Cyst removal on back    . Cervical spine surgery    . Knee surgery Left   . Abdominal hysterectomy    . Cataract extraction w/ intraocular lens implant Bilateral   . Ganglion cyst excision Bilateral  Medications: Current Outpatient Prescriptions  Medication Sig Dispense Refill  . acetaminophen (TYLENOL) 500 MG tablet Take 500 mg by mouth every 6 (six) hours as needed.    Marland Kitchen atenolol (TENORMIN) 50 MG tablet Take 50 mg by mouth 2 (two) times daily.     Marland Kitchen atorvastatin (LIPITOR) 20 MG tablet Take 10 mg by mouth daily.     . DULoxetine (CYMBALTA) 60 MG capsule Take 60 mg by mouth daily.    Marland Kitchen esomeprazole (NEXIUM) 40 MG capsule Take 40 mg by mouth daily.     . furosemide (LASIX) 20 MG tablet Take 20 mg by mouth daily.    Marland Kitchen HYDROcodone-acetaminophen (NORCO/VICODIN) 5-325 MG per tablet   0  . levothyroxine (SYNTHROID, LEVOTHROID) 112 MCG tablet Take 112 mcg by mouth daily before breakfast.    . nitroGLYCERIN (NITROSTAT) 0.4 MG SL tablet Place 1 tablet (0.4 mg  total) under the tongue every 5 (five) minutes as needed for chest pain. 25 tablet 3  . psyllium (REGULOID) 0.52 G capsule Take 0.52 g by mouth 2 (two) times daily.    . valACYclovir (VALTREX) 1000 MG tablet   3  . valsartan (DIOVAN) 80 MG tablet Take 80 mg by mouth 2 (two) times daily.    . verapamil (COVERA HS) 180 MG (CO) 24 hr tablet Take 180 mg by mouth at bedtime.     No current facility-administered medications for this visit.    Allergies: Allergies  Allergen Reactions  . Aspirin Other (See Comments)    Baby aspirin is tolerated, itching in the past  . Percocet [Oxycodone-Acetaminophen]   . Prednisone Other (See Comments)    Heart palpitations  . Sulfonamide Derivatives Other (See Comments)    Unknown reaction a long time ago    Social History: The patient  reports that she has never smoked. She has never used smokeless tobacco. She reports that she does not drink alcohol or use illicit drugs.   Family History: The patient's family history includes Brain cancer in her other; Colon cancer in her mother; Heart attack (age of onset: 89) in her father; Heart disease in her maternal aunt and maternal uncle. There is no history of Pancreatic cancer, Prostate cancer, Stomach cancer, Liver disease, or Kidney disease.   Review of Systems: Please see the history of present illness.   Otherwise, the review of systems is positive for bleeding hemorrhoids - she has had several banding procedures by Dr. Deatra Ina.  She has had lower right quadrant pain - seems to come and go.  All other systems are reviewed and negative.   Physical Exam: VS:  BP 140/80 mmHg  Pulse 70  Ht 5\' 4"  (1.626 m)  Wt 168 lb (76.204 kg)  BMI 28.82 kg/m2  SpO2 99% .  BMI Body mass index is 28.82 kg/(m^2).  Wt Readings from Last 3 Encounters:  02/08/15 168 lb (76.204 kg)  02/03/15 173 lb 6.4 oz (78.654 kg)  12/07/14 168 lb (76.204 kg)    General: Pleasant. Very talkative. In no acute distress. She is a little  anxious.  HEENT: Normal. Neck: Supple, no JVD, carotid bruits, or masses noted.  Cardiac: Regular rate and rhythm. No murmurs, rubs, or gallops. No edema.  Respiratory:  Lungs are clear to auscultation bilaterally with normal work of breathing.  GI: Soft and nontender.  MS: No deformity or atrophy. Gait and ROM intact. Skin: Warm and dry. Color is normal.  Neuro:  Strength and sensation are intact and no gross focal deficits  noted.  Psych: Alert, appropriate and with normal affect.   LABORATORY DATA:  EKG:  EKG is not ordered today.   Lab Results  Component Value Date   WBC 6.3 03/07/2014   HGB 12.2 03/07/2014   HCT 37.0 03/07/2014   PLT 272.0 03/07/2014   GLUCOSE 74 03/07/2014   CHOL 148 03/07/2014   TRIG 58.0 03/07/2014   HDL 62.20 03/07/2014   LDLCALC 74 03/07/2014   ALT 15 03/07/2014   AST 21 03/07/2014   NA 138 03/07/2014   K 4.4 03/07/2014   CL 99 03/07/2014   CREATININE 0.8 03/07/2014   BUN 18 03/07/2014   CO2 30 03/07/2014   TSH 0.53 03/07/2014   INR 2.63* 06/29/2012    BNP (last 3 results) No results for input(s): BNP in the last 8760 hours.  ProBNP (last 3 results) No results for input(s): PROBNP in the last 8760 hours.   Other Studies Reviewed Today:  Myoview Impression from 06/2014 Exercise Capacity: Lexiscan with no exercise. BP Response: Normal blood pressure response. Clinical Symptoms: No chest pain. ECG Impression: No significant ST segment change suggestive of ischemia. Comparison with Prior Nuclear Study: No images to compare  Overall Impression: Normal stress nuclear study. LV Ejection Fraction: 69%. LV Wall Motion: NL LV Function; NL Wall Motion  Darlin Coco MD    Echo Study Conclusions from March 2015  - Left ventricle: The cavity size was normal. There was mild focal basal hypertrophy of the septum. Systolic function was normal. The estimated ejection fraction was in the range of 55% to 60%. Wall motion was normal;  there were no regional wall motion abnormalities. Left ventricular diastolic function parameters were normal. - Mitral valve: Mild regurgitation. - Left atrium: The atrium was mildly dilated. - Atrial septum: There was an atrial septal aneurysm. - Tricuspid valve: Severe regurgitation. - Pulmonary arteries: Systolic pressure was moderately increased. PA peak pressure: 87mm Hg (S).  CARDIAC CATH IMPRESSION FROM MAY 2012  1. Mild-to-moderate nonobstructive coronary artery disease.  2. Normal left ventricular systolic function.  RECOMMENDATIONS: I do not see any lesions that would be causing chest  pain and dyspnea in this patient. I would recommend continued medical  management.  Lauree Chandler, MD  CM/MEDQ D: 05/06/2011 T: 05/07/2011 Job: [id number]  cc: Thomas C. Verl Blalock, MD, [facility]    CAROTID DOPPLER IMPRESSION FROM December 2014: 1. Mild bilateral carotid bifurcation plaque resulting in less than 50% diameter stenosis. The exam does not exclude plaque ulceration or embolization. Continued surveillance recommended.   Electronically Signed By: Arne Cleveland M.D. On: 12/27/2013 14:24     Assessment / Plan:   1. HTN - BP ok on current regimen. She will continue to monitor at home.   2. CAD - last cath in May of 2012 with mild to moderate nonobstructive CAD - managed medically. Myoview back in July was satisfactory. Would continue with current regimen.   3. OA - chronic.  4. Presyncope - has not recurred   5. Valvular heart disease - would favor medical management.   6. Possible TIA - no recurrence. Not able to tolerate aspirin therapy. Will continue to watch for now. She is to let us know if she has recurrence.   7. HLD - labs checked by PCP  8. Rectal bleeding - s/p banding by GI  9. Carotid disease - consider recheck on return.   Current medicines are reviewed with the patient today.  The patient does not have concerns regarding medicines other than what  has  been noted above.  The following changes have been made:  N/A  Labs/ tests ordered today include: N/A  No orders of the defined types were placed in this encounter.    Disposition:   FU with me in 12  months  Patient is agreeable to this plan and will call if any problems develop in the interim.   Signed: Burtis Junes, RN, ANP-C 02/08/2015 8:49 AM  [facility] [address] [place], [place]  [address] Phone: [phone] Fax: [phone]

## 2015-02-22 DIAGNOSIS — M545 Low back pain: Secondary | ICD-10-CM | POA: Diagnosis not present

## 2015-02-22 DIAGNOSIS — M25561 Pain in right knee: Secondary | ICD-10-CM | POA: Diagnosis not present

## 2015-03-13 ENCOUNTER — Other Ambulatory Visit: Payer: Self-pay | Admitting: Family Medicine

## 2015-03-13 ENCOUNTER — Ambulatory Visit
Admission: RE | Admit: 2015-03-13 | Discharge: 2015-03-13 | Disposition: A | Discharge status: Home / Self Care | Payer: Medicare Other | Source: Ambulatory Visit | Attending: Family Medicine | Admitting: Family Medicine

## 2015-03-13 DIAGNOSIS — M79662 Pain in left lower leg: Secondary | ICD-10-CM | POA: Diagnosis not present

## 2015-03-13 DIAGNOSIS — M545 Low back pain: Secondary | ICD-10-CM | POA: Diagnosis not present

## 2015-03-13 DIAGNOSIS — R609 Edema, unspecified: Secondary | ICD-10-CM

## 2015-03-13 DIAGNOSIS — L03119 Cellulitis of unspecified part of limb: Secondary | ICD-10-CM | POA: Diagnosis not present

## 2015-03-13 DIAGNOSIS — I8393 Asymptomatic varicose veins of bilateral lower extremities: Secondary | ICD-10-CM | POA: Diagnosis not present

## 2015-03-13 DIAGNOSIS — R600 Localized edema: Secondary | ICD-10-CM | POA: Diagnosis not present

## 2015-03-13 DIAGNOSIS — R0600 Dyspnea, unspecified: Secondary | ICD-10-CM | POA: Diagnosis not present

## 2015-03-13 DIAGNOSIS — L03116 Cellulitis of left lower limb: Secondary | ICD-10-CM

## 2015-03-13 DIAGNOSIS — Z86718 Personal history of other venous thrombosis and embolism: Secondary | ICD-10-CM | POA: Diagnosis not present

## 2015-03-15 DIAGNOSIS — L03116 Cellulitis of left lower limb: Secondary | ICD-10-CM | POA: Diagnosis not present

## 2015-03-15 DIAGNOSIS — R51 Headache: Secondary | ICD-10-CM | POA: Diagnosis not present

## 2015-03-15 DIAGNOSIS — Z23 Encounter for immunization: Secondary | ICD-10-CM | POA: Diagnosis not present

## 2015-03-16 DIAGNOSIS — J3089 Other allergic rhinitis: Secondary | ICD-10-CM | POA: Diagnosis not present

## 2015-03-16 DIAGNOSIS — J301 Allergic rhinitis due to pollen: Secondary | ICD-10-CM | POA: Diagnosis not present

## 2015-03-18 DIAGNOSIS — J384 Edema of larynx: Secondary | ICD-10-CM | POA: Diagnosis not present

## 2015-03-18 DIAGNOSIS — L0390 Cellulitis, unspecified: Secondary | ICD-10-CM | POA: Diagnosis not present

## 2015-03-18 DIAGNOSIS — T7840XA Allergy, unspecified, initial encounter: Secondary | ICD-10-CM | POA: Diagnosis not present

## 2015-03-19 DIAGNOSIS — R609 Edema, unspecified: Secondary | ICD-10-CM | POA: Diagnosis not present

## 2015-03-19 DIAGNOSIS — T7840XA Allergy, unspecified, initial encounter: Secondary | ICD-10-CM | POA: Diagnosis not present

## 2015-03-20 ENCOUNTER — Other Ambulatory Visit: Payer: Self-pay | Admitting: *Deleted

## 2015-03-20 DIAGNOSIS — R609 Edema, unspecified: Secondary | ICD-10-CM

## 2015-03-22 ENCOUNTER — Ambulatory Visit: Payer: Medicare Other | Admitting: Gastroenterology

## 2015-04-06 DIAGNOSIS — I872 Venous insufficiency (chronic) (peripheral): Secondary | ICD-10-CM | POA: Diagnosis not present

## 2015-04-06 DIAGNOSIS — R1030 Lower abdominal pain, unspecified: Secondary | ICD-10-CM | POA: Diagnosis not present

## 2015-04-06 DIAGNOSIS — R6883 Chills (without fever): Secondary | ICD-10-CM | POA: Diagnosis not present

## 2015-04-10 ENCOUNTER — Telehealth: Payer: Self-pay | Admitting: Neurology

## 2015-04-10 ENCOUNTER — Other Ambulatory Visit: Payer: Self-pay

## 2015-04-10 DIAGNOSIS — Z1231 Encounter for screening mammogram for malignant neoplasm of breast: Secondary | ICD-10-CM

## 2015-04-10 NOTE — Telephone Encounter (Signed)
Pt says she received some paperwork from our office for a new patient appt but has misplaced it. Do you have any idea what it may have been? We do not generally send paperwork, I though it may be a release form. Any ideas? CB# [phone]

## 2015-04-11 NOTE — Telephone Encounter (Signed)
Patient is not sure what type of forms or papers she got I did give her the address which she did not have the day and time of appt

## 2015-04-12 ENCOUNTER — Ambulatory Visit
Admission: RE | Admit: 2015-04-12 | Discharge: 2015-04-12 | Disposition: A | Discharge status: Home / Self Care | Payer: Medicare Other | Source: Ambulatory Visit

## 2015-04-12 DIAGNOSIS — Z1231 Encounter for screening mammogram for malignant neoplasm of breast: Secondary | ICD-10-CM

## 2015-04-26 ENCOUNTER — Encounter: Payer: Self-pay | Admitting: Neurology

## 2015-04-26 ENCOUNTER — Ambulatory Visit (INDEPENDENT_AMBULATORY_CARE_PROVIDER_SITE_OTHER): Payer: Medicare Other | Admitting: Neurology

## 2015-04-26 VITALS — BP 120/70 | HR 80 | Ht 64.0 in | Wt 171.0 lb

## 2015-04-26 DIAGNOSIS — R51 Headache: Secondary | ICD-10-CM

## 2015-04-26 DIAGNOSIS — I2510 Atherosclerotic heart disease of native coronary artery without angina pectoris: Secondary | ICD-10-CM | POA: Diagnosis not present

## 2015-04-26 DIAGNOSIS — G4486 Cervicogenic headache: Secondary | ICD-10-CM

## 2015-04-26 MED ORDER — TIZANIDINE HCL 2 MG PO TABS: ORAL_TABLET | ORAL | Status: DC

## 2015-04-26 NOTE — Patient Instructions (Signed)
1.  At onset of neck pain, take 2mg  of tizanidine.  May take every 8 hours as needed but you should really limit it to no more than 2 days out of the week. 2.  Follow up in 6 weeks.

## 2015-04-26 NOTE — Progress Notes (Signed)
[place] MRN: [id number] DOB: February 12, 1933  Referring provider: Dr. Chapman Fitch Primary care provider: Dr. Chapman Fitch  Reason for consult:  headache  HISTORY OF PRESENT ILLNESS: [name] [name] is an 81 year old right-handed female with hypothyroidism, hypertension, GERD, osteoporosis, and hyperlipidemia with history of migraine and cervical spinal surgery who presents for headache.  Records, CT of head from 2015 and MRI of cervical spine from 2012 reviewed.  Several years ago, she hit the top of her head on the door of a kitchen cabinet.  He has had headaches off and on since then.  They start in the left side of her neck and travel to behind the left ear and to the left temple.  It is an 8/10 throbbing pain.  It lasts anywhere from 30 minutes to 4 hours.  It occurred about 2 times a week for the last month or so, until recently. There is no associated nausea, photophobia or phonophobia.  She typically takes Tylenol.  Sometimes she takes a Vicodin.  She has a remote history of migraines, but these are different.  She endorses stress related to caring for her husband who requires frequent visits to the doctor and she worries about him.  This often triggers her headaches.  He just recently finished a round of doctor visits so the headaches have calmed down.  She did have a CT of the head performed on 03/09/14 which was unremarkable.  She has a history of neck problems and underwent cervical spine surgery over 10 years ago.  An MRI of the cervical spine performed in 2012 showed spondylosis with foraminal narrowing , involving the left C4 nerve root, as well as left greater than right C5-6 foramina.  PAST MEDICAL HISTORY: Past Medical History  Diagnosis Date  . Coronary artery disease   . Hyperlipidemia   . Orthostatic hypotension   . Hypertension   . Osteoarthrosis, unspecified whether generalized or localized, unspecified site   . Family history of malignant neoplasm of  gastrointestinal tract   . Allergic rhinitis, cause unspecified   . Unspecified functional disorder of intestine   . H/O: hysterectomy   . Osteoporosis   . Anxiety and depression   . GERD (gastroesophageal reflux disease)   . Hypothyroidism   . Chronic headaches     PAST SURGICAL HISTORY: Past Surgical History  Procedure Laterality Date  . Cardiac catheterization    . Total knee arthroplasty Right   . Nasal sinus surgery    . Tonsillectomy    . Appendectomy    . Cyst removal on back    . Cervical spine surgery    . Knee surgery Left   . Abdominal hysterectomy    . Cataract extraction w/ intraocular lens implant Bilateral   . Ganglion cyst excision Bilateral     MEDICATIONS: Current Outpatient Prescriptions on File Prior to Visit  Medication Sig Dispense Refill  . acetaminophen (TYLENOL) 500 MG tablet Take 500 mg by mouth every 6 (six) hours as needed.    Marland Kitchen atenolol (TENORMIN) 50 MG tablet Take 50 mg by mouth 2 (two) times daily.     Marland Kitchen atorvastatin (LIPITOR) 20 MG tablet Take 10 mg by mouth daily.     . DULoxetine (CYMBALTA) 60 MG capsule Take 60 mg by mouth daily.    Marland Kitchen esomeprazole (NEXIUM) 40 MG capsule Take 40 mg by mouth daily.     . furosemide (LASIX) 20 MG tablet Take 20 mg by mouth daily.    Marland Kitchen  HYDROcodone-acetaminophen (NORCO/VICODIN) 5-325 MG per tablet   0  . levothyroxine (SYNTHROID, LEVOTHROID) 112 MCG tablet Take 112 mcg by mouth daily before breakfast.    . psyllium (REGULOID) 0.52 G capsule Take 0.52 g by mouth 2 (two) times daily.    . valACYclovir (VALTREX) 1000 MG tablet   3  . valsartan (DIOVAN) 80 MG tablet Take 80 mg by mouth 2 (two) times daily.    . verapamil (COVERA HS) 180 MG (CO) 24 hr tablet Take 180 mg by mouth at bedtime.    . nitroGLYCERIN (NITROSTAT) 0.4 MG SL tablet Place 1 tablet (0.4 mg total) under the tongue every 5 (five) minutes as needed for chest pain. (Patient not taking: Reported on 04/26/2015) 25 tablet 3   No current  facility-administered medications on file prior to visit.    ALLERGIES: Allergies  Allergen Reactions  . Amoxicillin     Lip/throat swelling  . Aspirin Other (See Comments)    Baby aspirin is tolerated, itching in the past  . Percocet [Oxycodone-Acetaminophen]   . Prednisone Other (See Comments)    Heart palpitations  . Sulfonamide Derivatives Other (See Comments)    Unknown reaction a long time ago    FAMILY HISTORY: Family History  Problem Relation Age of Onset  . Brain cancer Other     1st degree relative  . Heart disease Maternal Uncle     x 4  . Colon cancer Mother   . Heart attack Father 1  . Heart disease Maternal Aunt     x 1  . Pancreatic cancer Neg Hx   . Prostate cancer Neg Hx   . Stomach cancer Neg Hx     pt notes that mother says she had "part of her stomach removed"  . Liver disease Neg Hx   . Kidney disease Neg Hx     SOCIAL HISTORY: History   Social History  . Marital Status: Married    Spouse Name: N/A  . Number of Children: 0  . Years of Education: N/A   Occupational History  . retired    Social History Main Topics  . Smoking status: Never Smoker   . Smokeless tobacco: Never Used     Comment: was a passive smoker for 15 yrs  . Alcohol Use: No  . Drug Use: No  . Sexual Activity: Not Currently   Other Topics Concern  . Not on file   Social History Narrative    REVIEW OF SYSTEMS: Constitutional: No fevers, chills, or sweats, no generalized fatigue, change in appetite Eyes: No visual changes, double vision, eye pain Ear, nose and throat: No hearing loss, ear pain, nasal congestion, sore throat Cardiovascular: No chest pain, palpitations Respiratory:  No shortness of breath at rest or with exertion, wheezes GastrointestinaI: No nausea, vomiting, diarrhea, abdominal pain, fecal incontinence Genitourinary:  No dysuria, urinary retention or frequency Musculoskeletal:  No neck pain, right flank and hip pain Integumentary: No rash,  pruritus, skin lesions Neurological: as above Psychiatric: No depression, insomnia, anxiety Endocrine: No palpitations, fatigue, diaphoresis, mood swings, change in appetite, change in weight, increased thirst Hematologic/Lymphatic:  No anemia, purpura, petechiae. Allergic/Immunologic: no itchy/runny eyes, nasal congestion, recent allergic reactions, rashes  PHYSICAL EXAM: Filed Vitals:   04/26/15 1435  BP: 120/70  Pulse: 80   General: No acute distress Head:  Normocephalic/atraumatic Eyes:  fundi unremarkable, without vessel changes, exudates, hemorrhages or papilledema. Neck: supple, no paraspinal tenderness, full range of motion Back: No paraspinal tenderness Heart: regular rate  and rhythm Lungs: Clear to auscultation bilaterally. Vascular: No carotid bruits. Neurological Exam: Mental status: alert and oriented to person, place, and time, recent and remote memory intact, fund of knowledge intact, attention and concentration intact, speech fluent and not dysarthric, language intact. Cranial nerves: CN I: not tested CN II: pupils equal, round and reactive to light, visual fields intact, fundi unremarkable, without vessel changes, exudates, hemorrhages or papilledema. CN III, IV, VI:  full range of motion, no nystagmus, no ptosis CN V: facial sensation intact CN VII: upper and lower face symmetric CN VIII: hearing intact CN IX, X: gag intact, uvula midline CN XI: sternocleidomastoid and trapezius muscles intact CN XII: tongue midline Bulk & Tone: normal, no fasciculations. Motor:  5/5 throughout Sensation:  Mildly reduced temperature sensation in feet.  Vibration sensation intact. Deep Tendon Reflexes:  2+ throughout except absent in ankles.  Toes downgoing. Finger to nose testing:  No dysmetria Heel to shin:  No dysmetria Gait:  Normal station and stride.  Some unsteadiness when tandem walking. Romberg negative.  IMPRESSION: Cervicogenic headache  PLAN: She is on  multiple medications, many that are used for chronic headache.  I would rather not add another daily medication.  Since they are currently infrequent, I will prescribe her tizanidine 2mg  to take at earliest onset of the neck pain She will follow up in 6 weeks.  45 minutes spent with patient, over 50% spent discussing diagnosis and plan.  Thank you for allowing me to take part in the care of this patient.  Metta Clines, DO  CC: Antony Blackbird, MD

## 2015-04-27 ENCOUNTER — Encounter: Payer: Self-pay | Admitting: Vascular Surgery

## 2015-04-28 ENCOUNTER — Ambulatory Visit (INDEPENDENT_AMBULATORY_CARE_PROVIDER_SITE_OTHER): Payer: Medicare Other | Admitting: Vascular Surgery

## 2015-04-28 ENCOUNTER — Encounter: Payer: Self-pay | Admitting: Vascular Surgery

## 2015-04-28 ENCOUNTER — Ambulatory Visit (HOSPITAL_COMMUNITY)
Admission: RE | Admit: 2015-04-28 | Discharge: 2015-04-28 | Disposition: A | Discharge status: Home / Self Care | Payer: Medicare Other | Source: Ambulatory Visit | Attending: Vascular Surgery | Admitting: Vascular Surgery

## 2015-04-28 VITALS — BP 163/74 | HR 96 | Resp 16 | Ht 64.0 in | Wt 172.0 lb

## 2015-04-28 DIAGNOSIS — I872 Venous insufficiency (chronic) (peripheral): Secondary | ICD-10-CM

## 2015-04-28 DIAGNOSIS — I83899 Varicose veins of unspecified lower extremities with other complications: Secondary | ICD-10-CM

## 2015-04-28 DIAGNOSIS — R609 Edema, unspecified: Secondary | ICD-10-CM | POA: Diagnosis present

## 2015-04-28 DIAGNOSIS — I83893 Varicose veins of bilateral lower extremities with other complications: Secondary | ICD-10-CM

## 2015-04-28 DIAGNOSIS — I2510 Atherosclerotic heart disease of native coronary artery without angina pectoris: Secondary | ICD-10-CM | POA: Diagnosis not present

## 2015-04-28 HISTORY — DX: Varicose veins of unspecified lower extremity with other complications: I83.899

## 2015-04-28 HISTORY — DX: Venous insufficiency (chronic) (peripheral): I87.2

## 2015-04-28 NOTE — Progress Notes (Signed)
Referred by:  Antony Blackbird, MD [id number], [place] [address]  Reason for referral: Swollen bilateral leg  History of Present Illness  [name] [name] is a 81 y.o. (04/27/1933) female who presents with chief complaint: swollen leg.  Patient notes, onset of swelling 4-5 years ago, associated with prolonged sitting and/or walking.  The patient's symptoms include: pain, swelling, bluish discoloration.  The patient has had  history of DVT, no history of pregnancy,  history of varicose vein, no history of venous stasis ulcers, no history of  Lymphedema and  history of skin changes in lower legs.  There is a family history of venous disorders.  The patient has not used compression stockings in the past.  Past midscale history includes: hypertension, hyperlipidemia, CAD, DVT, CP.  She takes a statin and beta blocker daily.  Past Medical History  Diagnosis Date  . Coronary artery disease   . Hyperlipidemia   . Orthostatic hypotension   . Hypertension   . Osteoarthrosis, unspecified whether generalized or localized, unspecified site   . Family history of malignant neoplasm of gastrointestinal tract   . Allergic rhinitis, cause unspecified   . Unspecified functional disorder of intestine   . H/O: hysterectomy   . Osteoporosis   . Anxiety and depression   . GERD (gastroesophageal reflux disease)   . Hypothyroidism   . Chronic headaches     Past Surgical History  Procedure Laterality Date  . Cardiac catheterization    . Total knee arthroplasty Right   . Nasal sinus surgery    . Tonsillectomy    . Appendectomy    . Cyst removal on back    . Cervical spine surgery    . Knee surgery Left   . Abdominal hysterectomy    . Cataract extraction w/ intraocular lens implant Bilateral   . Ganglion cyst excision Bilateral     History   Social History  . Marital Status: Married    Spouse Name: N/A  . Number of Children: 0  . Years of Education: N/A   Occupational History  .  retired    Social History Main Topics  . Smoking status: Never Smoker   . Smokeless tobacco: Never Used     Comment: was a passive smoker for 15 yrs  . Alcohol Use: No  . Drug Use: No  . Sexual Activity: Not Currently   Other Topics Concern  . Not on file   Social History Narrative    Family History  Problem Relation Age of Onset  . Brain cancer Other     1st degree relative  . Heart disease Maternal Uncle     x 4  . Colon cancer Mother   . Cancer Mother   . Varicose Veins Mother   . Heart attack Father 48  . Heart disease Father   . Heart disease Maternal Aunt     x 1  . Pancreatic cancer Neg Hx   . Prostate cancer Neg Hx   . Stomach cancer Neg Hx     pt notes that mother says she had "part of her stomach removed"  . Liver disease Neg Hx   . Kidney disease Neg Hx       Current Outpatient Prescriptions on File Prior to Visit  Medication Sig Dispense Refill  . acetaminophen (TYLENOL) 500 MG tablet Take 500 mg by mouth every 6 (six) hours as needed.    Marland Kitchen atenolol (TENORMIN) 50 MG tablet Take 50 mg by  mouth 2 (two) times daily.     Marland Kitchen atorvastatin (LIPITOR) 20 MG tablet Take 10 mg by mouth daily.     . DULoxetine (CYMBALTA) 60 MG capsule Take 60 mg by mouth daily.    Marland Kitchen esomeprazole (NEXIUM) 40 MG capsule Take 40 mg by mouth daily.     . furosemide (LASIX) 20 MG tablet Take 20 mg by mouth daily.    Marland Kitchen HYDROcodone-acetaminophen (NORCO/VICODIN) 5-325 MG per tablet   0  . levothyroxine (SYNTHROID, LEVOTHROID) 112 MCG tablet Take 112 mcg by mouth daily before breakfast.    . nitroGLYCERIN (NITROSTAT) 0.4 MG SL tablet Place 1 tablet (0.4 mg total) under the tongue every 5 (five) minutes as needed for chest pain. 25 tablet 3  . valACYclovir (VALTREX) 1000 MG tablet   3  . valsartan (DIOVAN) 80 MG tablet Take 80 mg by mouth 2 (two) times daily.    . verapamil (COVERA HS) 180 MG (CO) 24 hr tablet Take 180 mg by mouth at bedtime.    . psyllium (REGULOID) 0.52 G capsule Take 0.52  g by mouth 2 (two) times daily.    Marland Kitchen tiZANidine (ZANAFLEX) 2 MG tablet Take 1tab every 8 hours as needed for neck pain (Patient not taking: Reported on 04/28/2015) 30 tablet 0   No current facility-administered medications on file prior to visit.    Allergies  Allergen Reactions  . Amoxicillin     Lip/throat swelling  . Aspirin Other (See Comments)    Baby aspirin is tolerated, itching in the past  . Percocet [Oxycodone-Acetaminophen]   . Prednisone Other (See Comments)    Heart palpitations  . Sulfonamide Derivatives Other (See Comments)    Unknown reaction a long time ago      REVIEW OF SYSTEMS:  (Positives checked otherwise negative)  CARDIOVASCULAR:  []  chest pain, []  chest pressure, []  palpitations, []  shortness of breath when laying flat, []  shortness of breath with exertion,  []  pain in feet when walking, []  pain in feet when laying flat, [x]  history of blood clot in veins (DVT), [x]  history of phlebitis, [x]  swelling in legs, []  varicose veins  PULMONARY:  []  productive cough, []  asthma, []  wheezing  NEUROLOGIC:  []  weakness in arms or legs, []  numbness in arms or legs, []  difficulty speaking or slurred speech, []  temporary loss of vision in one eye, []  dizziness  HEMATOLOGIC:  []  bleeding problems, []  problems with blood clotting too easily  MUSCULOSKEL:  [x]  joint pain, [x]  joint swelling  GASTROINTEST:  []  vomiting blood, []  blood in stool     GENITOURINARY:  []  burning with urination, []  blood in urine  PSYCHIATRIC:  []  history of major depression  INTEGUMENTARY:  []  rashes, []  ulcers  CONSTITUTIONAL:  []  fever, []  chills   Physical Examination Filed Vitals:   04/28/15 1406 04/28/15 1408  BP: 149/70 163/74  Pulse: 92 96  Resp: 16   Height: 5\' 4"  (1.626 m)   Weight: 172 lb (78.019 kg)    Body mass index is 29.51 kg/(m^2).  General: A&O x 3, WDWN  Head: [place]/AT, Temporalis wasting / Prominent temp pulse  Ear/Nose/Throat: Hearing grossly intact, nares  w/o erythema or drainage, oropharynx w/o Erythema/Exudate, Hearing loss, Nasal drainage, Dental caries, OP w/ erythema / exudate  Eyes: PE, EOMI, Post surg chg to pupils / Unable to coop w/ exam  Neck: Supple, no nuchal rigidity  Pulmonary: Sym exp, good air movt, CTAB, no rales, rhonchi, & wheezing  Cardiac: RRR, Nl  S1, S2, no Murmurs, rubs or gallops  Vascular: Vessel Right Left  Radial Palpable Palpable  Brachial Palpable Palpable  Carotid Palpable, without bruit Palpable, without bruit  Aorta Not palpable N/A  Femoral Palpable Palpable  Popliteal Not palpable Not palpable  PT Palpable Palpable  DP Palpable Palpable   Gastrointestinal: soft, positive tenderness to generalized palpation, ND, -G/R, - HSM, - masses, - CVAT B  Musculoskeletal: M/S 5/5 throughout , Extremities without ischemic changes   Neurologic: CN 2-12 intact , Pain and light touch intact in extremities , Motor exam as listed above  Psychiatric: Judgment intact, Mood & affect appropriate for pt's clinical situation  Dermatologic: See M/S exam for extremity exam, no rashes otherwise noted  Lymph : No Cervical, Axillary, or Inguinal lymphadenopathy   Non-Invasive Vascular Imaging  BLE Venous Duplex (Date: 04/28/2015):   RLE: partially occluded chronic thrombus right Giacomini popliteal regine DVT and SVT  LLE: neg DVT and SVT  BLE Venous Insufficiency Duplex (Date: 04/28/2015):   RLE: positive GSV reflux, positive deep venous reflux  LLE:   positive GSV reflux, positive deep venous reflux  Outside Studies/Documentation 11 pages of outside documents were reviewed including: .  Medical Decision Making  [name] [name] is a 81 y.o. female who presents with: BLE chronic venous insufficiency with skin discoloration changes.   Based on the patient's history and examination, Dr. Bridgett Larsson recommend:  20-30 mm thigh high compression stockings and need for 3 month trial of such.  The patient will follow up in 3  months with his partners in the [facility] for evaluation  Thank you for allowing [place] to participate in this patient's care.  Theda Sers Magda Muise [facility] PA-C [facility] [place] Office: [phone]  She was seen in conjunction with Dr. Bridgett Larsson today  04/28/2015, 3:02 PM   Addendum  I have independently interviewed and examined the patient, and I agree with the physician assistant's findings.  3 month trial of compression stockings then possible EVLA R GSV followed by possible L ASV EVLA vs stab phlebectomy  Adele Barthel, MD [facility] [place] Office: [phone] Pager: [phone]  04/28/2015, 6:45 PM

## 2015-04-28 NOTE — Progress Notes (Signed)
Filed Vitals:   04/28/15 1406 04/28/15 1408  BP: 149/70 163/74  Pulse: 92 96  Resp: 16   Height: 5\' 4"  (1.626 m)   Weight: 172 lb (78.019 kg)    Body mass index is 29.51 kg/(m^2).

## 2015-05-08 ENCOUNTER — Encounter: Payer: Self-pay | Admitting: Gastroenterology

## 2015-05-08 ENCOUNTER — Ambulatory Visit (INDEPENDENT_AMBULATORY_CARE_PROVIDER_SITE_OTHER): Payer: Medicare Other | Admitting: Gastroenterology

## 2015-05-08 VITALS — BP 102/62 | HR 72 | Wt 165.2 lb

## 2015-05-08 DIAGNOSIS — K648 Other hemorrhoids: Secondary | ICD-10-CM

## 2015-05-08 DIAGNOSIS — G8929 Other chronic pain: Secondary | ICD-10-CM | POA: Diagnosis not present

## 2015-05-08 DIAGNOSIS — Z860100 Personal history of colon polyps, unspecified: Secondary | ICD-10-CM

## 2015-05-08 DIAGNOSIS — I2510 Atherosclerotic heart disease of native coronary artery without angina pectoris: Secondary | ICD-10-CM | POA: Diagnosis not present

## 2015-05-08 DIAGNOSIS — R1031 Right lower quadrant pain: Secondary | ICD-10-CM

## 2015-05-08 NOTE — Progress Notes (Signed)
      History of Present Illness:  [name] [name] is here for complaints of lower abdominal pain.  For several months she is been experiencing right lower quadrant pain consisting of soreness and tenderness when she would turn or bend or twist.  Pain seems to radiate around to her back.  It is unaffected by bowel movements.  7 months ago she underwent upper and lower endoscopy.  The former demonstrated a mild nonspecific gastritis.  A small adenomatous polyp was removed at colonoscopy.    Review of Systems: Pertinent positive and negative review of systems were noted in the above HPI section. All other review of systems were otherwise negative.    Current Medications, Allergies, Past Medical History, Past Surgical History, Family History and Social History were reviewed in [place] record  Vital signs were reviewed in today's medical record. Physical Exam: General: Well developed , well nourished, no acute distress Skin: anicteric Head: Normocephalic and atraumatic Eyes:  sclerae anicteric, EOMI Ears: Normal auditory acuity Mouth: No deformity or lesions Lymph Nodes: no lymphadenopathy Lungs: Clear throughout to auscultation Heart: Regular rate and rhythm; no murmurs, rubs or brui: Gastroinestinal:  Soft,  and non distended. No masses, hepatosplenomegaly or hernias noted. Normal Bowel sounds.  There is mild tenderness to palpation in the right lower quadrant that increases with abdominal muscle wall flexion.  There is no CVA tenderness or spinal tenderness Rectal:deferred Musculoskeletal: Symmetrical with no gross deformities  Pulses:  Normal pulses noted Extremities: No clubbing, cyanosis, or deformities noted.  Air is trace pedal edema and multiple venous varicosities Neurological: Alert oriented x 4, grossly nonfocal Psychological:  Alert and cooperative. Normal mood and affect  See Assessment and Plan under Problem List

## 2015-05-08 NOTE — Assessment & Plan Note (Signed)
Lower quadrant pain appears to be abdominal wall pain.  There may be a component of  nerve root pain as well.  I doubt this is pain due to visceral disease.  The patient was instructed to try warm heat, Tylenol or low-dose ibuprofen.

## 2015-05-08 NOTE — Assessment & Plan Note (Signed)
Asymptomatic following band ligation

## 2015-05-08 NOTE — Patient Instructions (Signed)
Follow up as needed

## 2015-05-08 NOTE — Assessment & Plan Note (Signed)
Plan no further routine colonoscopy in view of patient's age

## 2015-06-12 DIAGNOSIS — R1030 Lower abdominal pain, unspecified: Secondary | ICD-10-CM | POA: Diagnosis not present

## 2015-06-12 DIAGNOSIS — M549 Dorsalgia, unspecified: Secondary | ICD-10-CM | POA: Diagnosis not present

## 2015-06-12 DIAGNOSIS — I10 Essential (primary) hypertension: Secondary | ICD-10-CM | POA: Diagnosis not present

## 2015-06-12 DIAGNOSIS — B001 Herpesviral vesicular dermatitis: Secondary | ICD-10-CM | POA: Diagnosis not present

## 2015-07-11 IMAGING — CR DG KNEE COMPLETE 4+V*R*
5 series · 5 of 5 positions shown · non-contrast
Comparison: None.

CLINICAL DATA: Right knee pain and effusion status post fall.. Pt
was walking into her garage when a giant dog ran into her and
knocked her down to the concrete ground

EXAM:
RIGHT KNEE - COMPLETE 4+ VIEW

[view not recorded (1 of 5)]
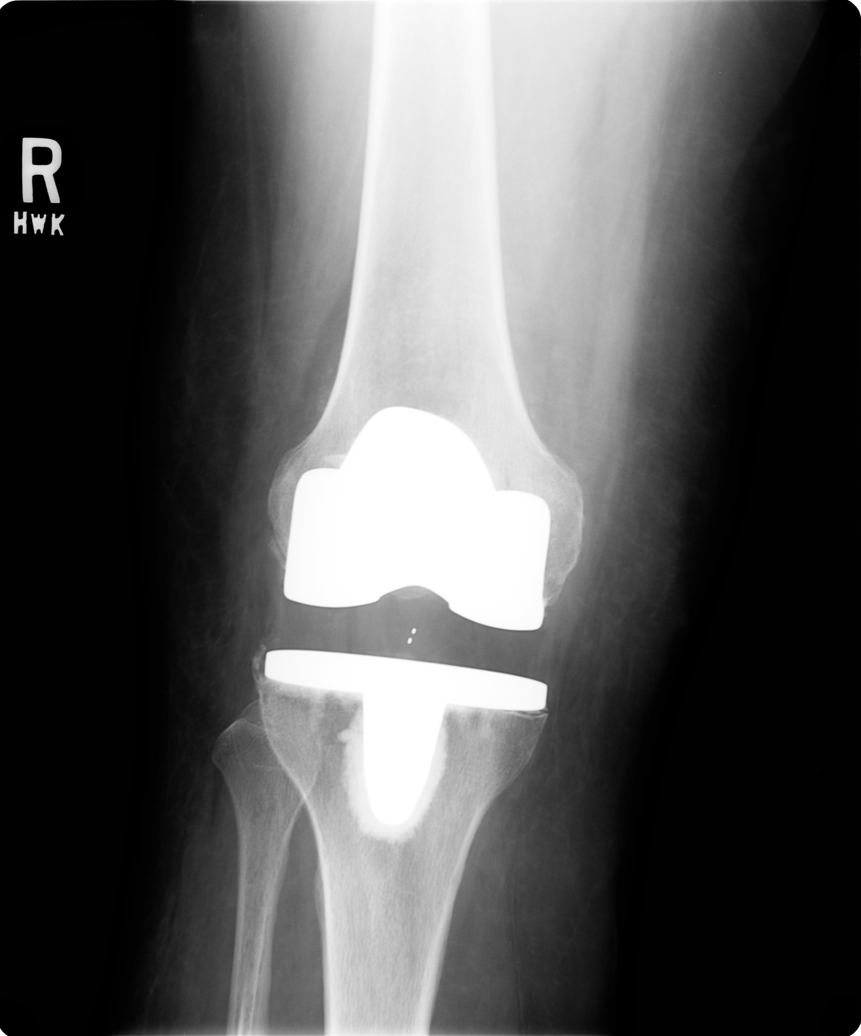

[view not recorded (2 of 5)]
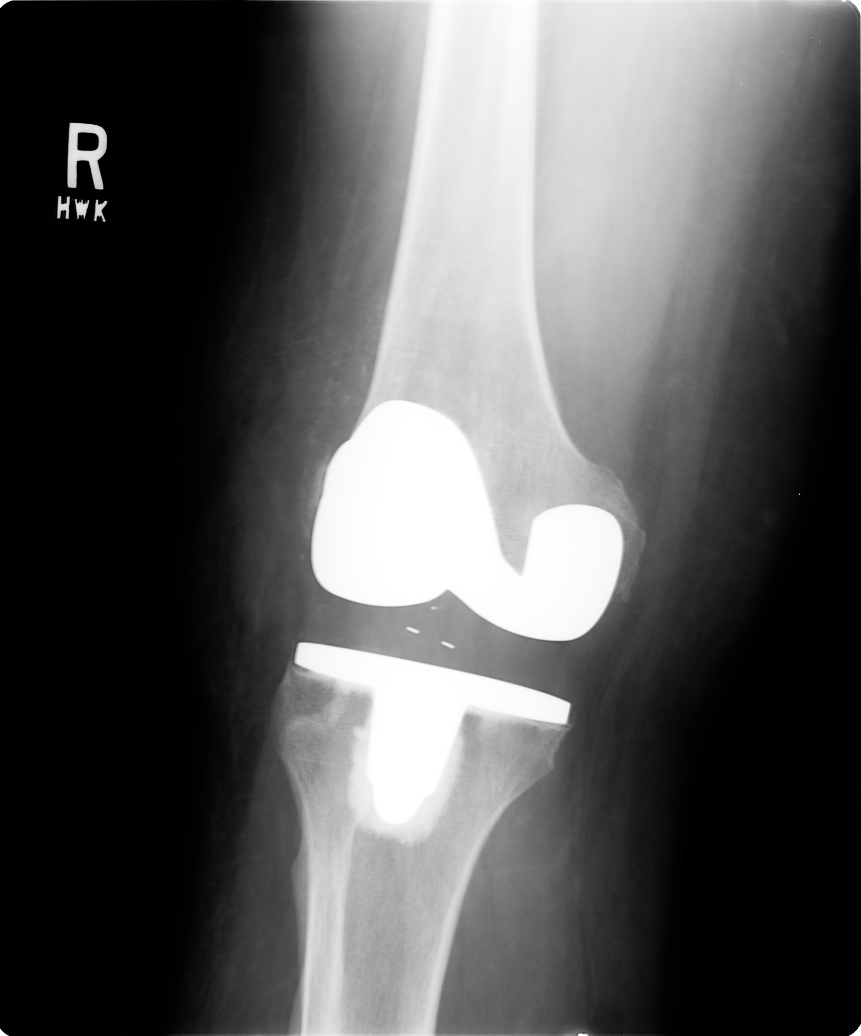

[view not recorded (3 of 5)]
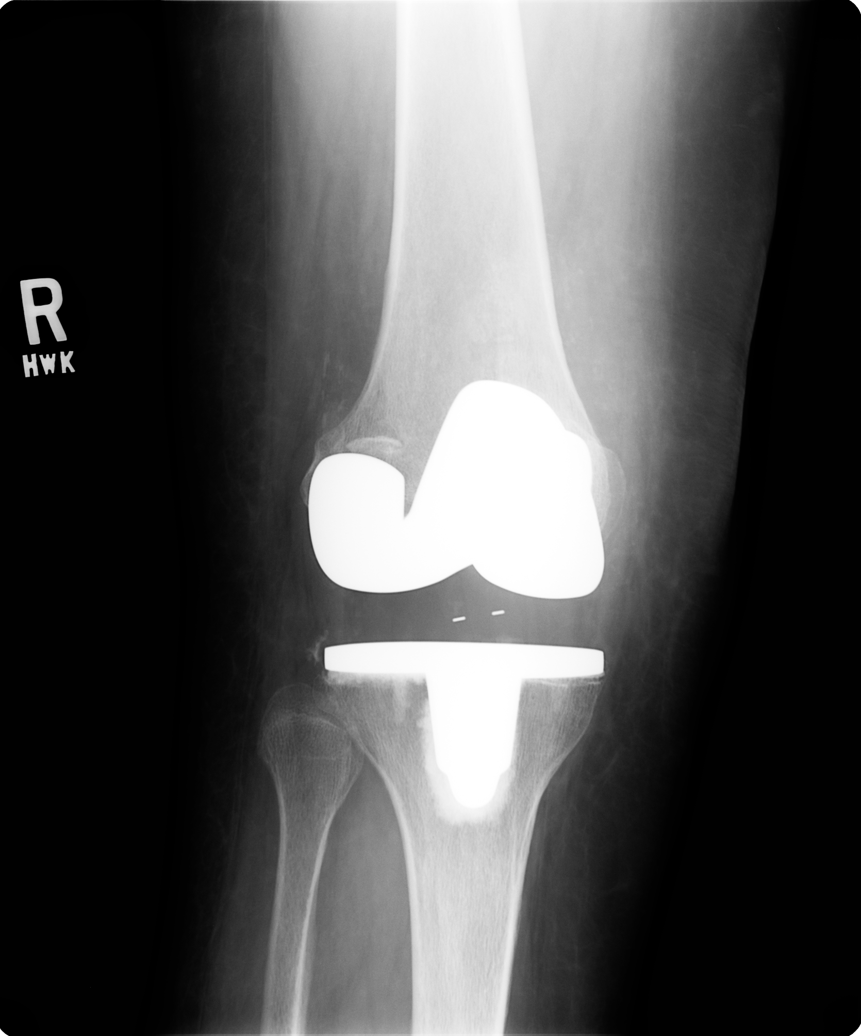

[view not recorded (4 of 5)]
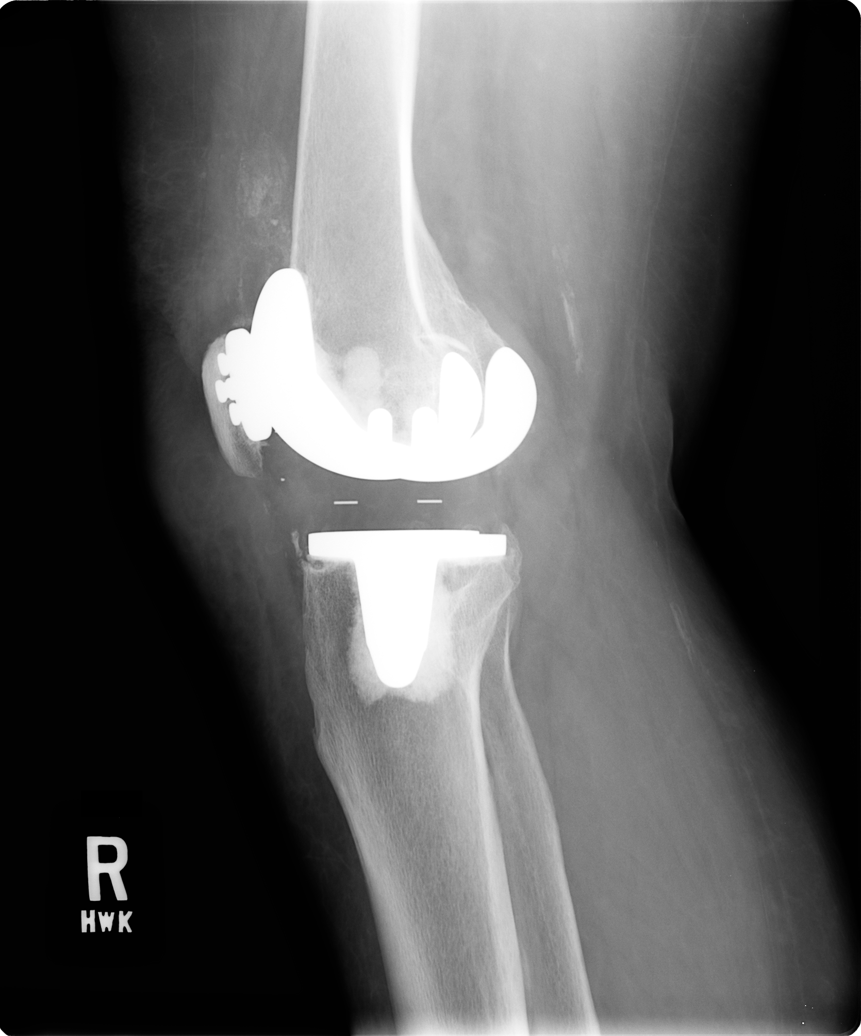

[view not recorded (5 of 5)]
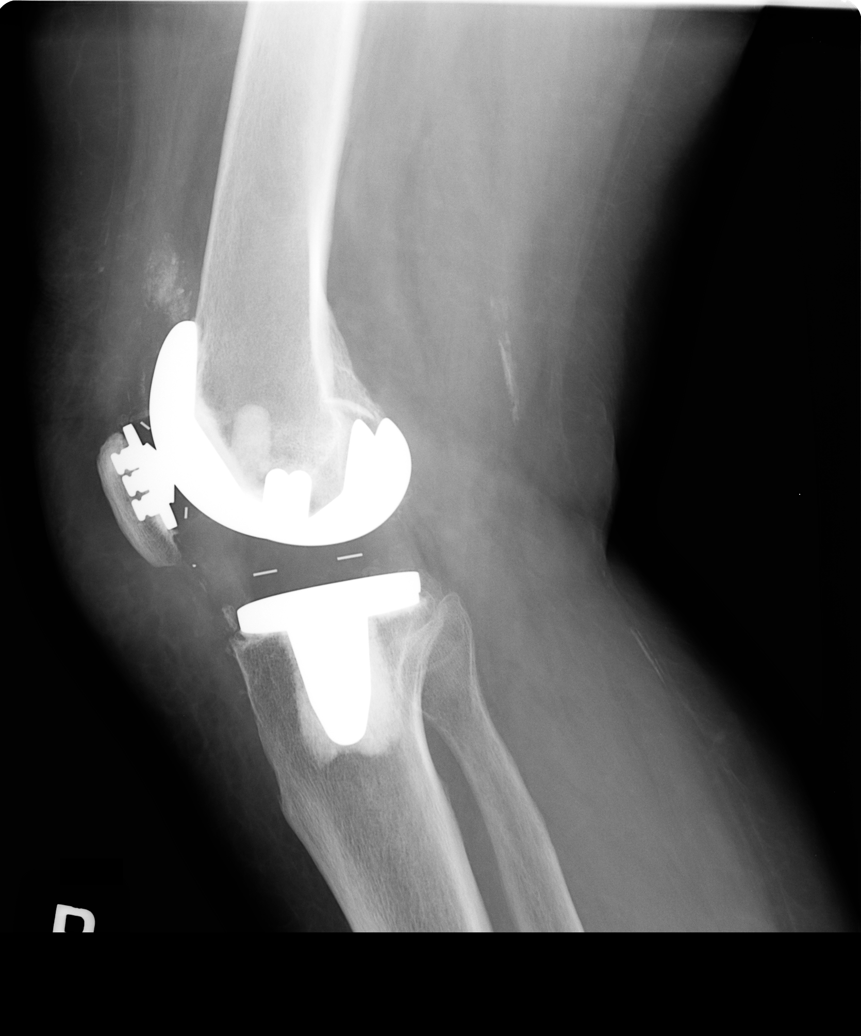

[5 of 5 positions shown; findings below may reference images not displayed]

FINDINGS: Right knee prosthetic noted. No fracture dislocation of the knee.
There is calcific material within the suprapatellar bursa. There is
soft tissue swelling of the prepatellar tissues.
IMPRESSION: No fracture or dislocation.

Chronic calcific material in the suprapatellar bursa.

Soft tissue swelling of the prepatellar tissues

## 2015-07-13 DIAGNOSIS — Z6829 Body mass index (BMI) 29.0-29.9, adult: Secondary | ICD-10-CM | POA: Diagnosis not present

## 2015-07-13 DIAGNOSIS — I10 Essential (primary) hypertension: Secondary | ICD-10-CM | POA: Diagnosis not present

## 2015-07-13 DIAGNOSIS — M5416 Radiculopathy, lumbar region: Secondary | ICD-10-CM | POA: Diagnosis not present

## 2015-07-19 DIAGNOSIS — M47816 Spondylosis without myelopathy or radiculopathy, lumbar region: Secondary | ICD-10-CM | POA: Diagnosis not present

## 2015-07-19 DIAGNOSIS — M5416 Radiculopathy, lumbar region: Secondary | ICD-10-CM | POA: Diagnosis not present

## 2015-07-19 DIAGNOSIS — M4806 Spinal stenosis, lumbar region: Secondary | ICD-10-CM | POA: Diagnosis not present

## 2015-07-19 DIAGNOSIS — M5136 Other intervertebral disc degeneration, lumbar region: Secondary | ICD-10-CM | POA: Diagnosis not present

## 2015-07-27 ENCOUNTER — Encounter: Payer: Self-pay | Admitting: Vascular Surgery

## 2015-07-27 DIAGNOSIS — M4316 Spondylolisthesis, lumbar region: Secondary | ICD-10-CM | POA: Diagnosis not present

## 2015-07-27 DIAGNOSIS — Z6828 Body mass index (BMI) 28.0-28.9, adult: Secondary | ICD-10-CM | POA: Diagnosis not present

## 2015-07-27 DIAGNOSIS — M5416 Radiculopathy, lumbar region: Secondary | ICD-10-CM | POA: Diagnosis not present

## 2015-08-01 ENCOUNTER — Ambulatory Visit (INDEPENDENT_AMBULATORY_CARE_PROVIDER_SITE_OTHER): Payer: Medicare Other | Admitting: Vascular Surgery

## 2015-08-01 ENCOUNTER — Encounter: Payer: Self-pay | Admitting: Vascular Surgery

## 2015-08-01 VITALS — BP 111/66 | HR 64 | Temp 97.1°F | Resp 18 | Ht 65.0 in | Wt 167.5 lb

## 2015-08-01 DIAGNOSIS — I2510 Atherosclerotic heart disease of native coronary artery without angina pectoris: Secondary | ICD-10-CM

## 2015-08-01 DIAGNOSIS — I83893 Varicose veins of bilateral lower extremities with other complications: Secondary | ICD-10-CM

## 2015-08-01 NOTE — Progress Notes (Signed)
Problems with Activities of Daily Living Secondary to Leg Pain  1. [name] [name] states any activities that requires prolonged standing (cooking, shopping, cleaning) are very difficult for her due to leg pain.   2. [name] [name] states she has difficulty with prolonged sitting (especially sitting in car and in church) due to leg pain.    3. [name] [name] states she has difficulty sleeping due to leg pain.     Failure of  Conservative Therapy:  1. Worn 20-30 mm Hg thigh high compression hose >3 months with no relief of symptoms.  2. Frequently elevates legs-no relief of symptoms  3. Taken Ibuprofen 600 Mg TID with no relief of symptoms.  Patient resents today with her husband for further discussion of venous hypertension. I reviewed her noninvasive vascular laboratory studies from April of this year and her evaluation with Dr. Bridgett Larsson. I did reimage her veins with SonoSite ultrasound. This does show gross reflux with marked enlargement throughout her right great saphenous vein. She does have dilatation throughout anterior accessory branch on her left leg. She has minimal reflux in her deep femoral veins. I do feel that the significant persistent symptoms are related to her superficial venous hypertension of recommended staged bilateral laser ablation. Explained the procedure to include local anesthesia and the very slight risk of DVT associated with the procedure. She understands and wished to proceed as soon as possible

## 2015-08-08 DIAGNOSIS — E039 Hypothyroidism, unspecified: Secondary | ICD-10-CM | POA: Diagnosis not present

## 2015-08-08 DIAGNOSIS — I10 Essential (primary) hypertension: Secondary | ICD-10-CM | POA: Diagnosis not present

## 2015-08-08 DIAGNOSIS — Z79899 Other long term (current) drug therapy: Secondary | ICD-10-CM | POA: Diagnosis not present

## 2015-08-08 DIAGNOSIS — M545 Low back pain: Secondary | ICD-10-CM | POA: Diagnosis not present

## 2015-08-08 DIAGNOSIS — E559 Vitamin D deficiency, unspecified: Secondary | ICD-10-CM | POA: Diagnosis not present

## 2015-08-08 DIAGNOSIS — E785 Hyperlipidemia, unspecified: Secondary | ICD-10-CM | POA: Diagnosis not present

## 2015-08-08 DIAGNOSIS — I87303 Chronic venous hypertension (idiopathic) without complications of bilateral lower extremity: Secondary | ICD-10-CM | POA: Diagnosis not present

## 2015-08-09 ENCOUNTER — Encounter: Payer: Self-pay | Admitting: Gastroenterology

## 2015-08-10 DIAGNOSIS — Z79899 Other long term (current) drug therapy: Secondary | ICD-10-CM | POA: Diagnosis not present

## 2015-08-10 DIAGNOSIS — I10 Essential (primary) hypertension: Secondary | ICD-10-CM | POA: Diagnosis not present

## 2015-08-10 DIAGNOSIS — E785 Hyperlipidemia, unspecified: Secondary | ICD-10-CM | POA: Diagnosis not present

## 2015-08-10 DIAGNOSIS — E039 Hypothyroidism, unspecified: Secondary | ICD-10-CM | POA: Diagnosis not present

## 2015-08-10 DIAGNOSIS — R5383 Other fatigue: Secondary | ICD-10-CM | POA: Diagnosis not present

## 2015-08-10 DIAGNOSIS — E559 Vitamin D deficiency, unspecified: Secondary | ICD-10-CM | POA: Diagnosis not present

## 2015-08-15 ENCOUNTER — Ambulatory Visit (INDEPENDENT_AMBULATORY_CARE_PROVIDER_SITE_OTHER): Payer: Medicare Other | Admitting: Neurology

## 2015-08-15 ENCOUNTER — Encounter: Payer: Self-pay | Admitting: Neurology

## 2015-08-15 VITALS — BP 118/64 | HR 72 | Resp 18 | Ht 65.0 in | Wt 170.5 lb

## 2015-08-15 DIAGNOSIS — R51 Headache: Secondary | ICD-10-CM

## 2015-08-15 DIAGNOSIS — M542 Cervicalgia: Secondary | ICD-10-CM | POA: Diagnosis not present

## 2015-08-15 DIAGNOSIS — G4486 Cervicogenic headache: Secondary | ICD-10-CM

## 2015-08-15 DIAGNOSIS — I2510 Atherosclerotic heart disease of native coronary artery without angina pectoris: Secondary | ICD-10-CM | POA: Diagnosis not present

## 2015-08-15 NOTE — Progress Notes (Signed)
NEUROLOGY FOLLOW UP OFFICE NOTE  [name] [name] [id number]  HISTORY OF PRESENT ILLNESS: [name] [name] is an 81 year old right-handed female with hypothyroidism, hypertension, GERD, osteoporosis, and hyperlipidemia with history of migraine and cervical spinal surgery who follows up for cervicogenic headache.  UPDATE: She was prescribed tizanidine 2mg  for neck pain and headache.  Headaches and neck pain are improved.  They are still present but manageable.  HISTORY: Several years ago, she hit the top of her head on the door of a kitchen cabinet.  He has had headaches off and on since then.  They start in the left side of her neck and travel to behind the left ear and to the left temple.  It is an 8/10 throbbing pain.  It lasts anywhere from 30 minutes to 4 hours.  It occurred about 2 times a week for the last month or so, until recently. There is no associated nausea, photophobia or phonophobia.  She typically takes Tylenol.  Sometimes she takes a Vicodin.  She has a remote history of migraines, but these are different.  She endorses stress related to caring for her husband who requires frequent visits to the doctor and she worries about him.  This often triggers her headaches.  He just recently finished a round of doctor visits so the headaches have calmed down.  She did have a CT of the head performed on 03/09/14 which was unremarkable.  She has a history of neck problems and underwent cervical spine surgery over 10 years ago.  An MRI of the cervical spine performed in 2012 showed spondylosis with foraminal narrowing , involving the left C4 nerve root, as well as left greater than right C5-6 foramina.  She takes multiple medications, several that happen to be used for headache.  These include atenolol, Cymbalta, and verapamil.  PAST MEDICAL HISTORY: Past Medical History  Diagnosis Date  . Coronary artery disease   . Hyperlipidemia   . Orthostatic hypotension   . Hypertension   . Osteoarthrosis,  unspecified whether generalized or localized, unspecified site   . Family history of malignant neoplasm of gastrointestinal tract   . Allergic rhinitis, cause unspecified   . Unspecified functional disorder of intestine   . H/O: hysterectomy   . Osteoporosis   . Anxiety and depression   . GERD (gastroesophageal reflux disease)   . Hypothyroidism   . Chronic headaches   . DJD (degenerative joint disease)     MEDICATIONS: Current Outpatient Prescriptions on File Prior to Visit  Medication Sig Dispense Refill  . acetaminophen (TYLENOL) 500 MG tablet Take 500 mg by mouth every 6 (six) hours as needed.    Marland Kitchen ascorbic acid (VITAMIN C) 1000 MG tablet Take 1,000 mg by mouth daily.    Marland Kitchen atenolol (TENORMIN) 50 MG tablet Take 50 mg by mouth 2 (two) times daily.     Marland Kitchen atorvastatin (LIPITOR) 20 MG tablet Take 10 mg by mouth daily.     . cyanocobalamin 2000 MCG tablet Take 2,000 mcg by mouth daily.    . DULoxetine (CYMBALTA) 60 MG capsule Take 60 mg by mouth daily.    Marland Kitchen esomeprazole (NEXIUM) 40 MG capsule Take 40 mg by mouth daily.     . furosemide (LASIX) 20 MG tablet Take 20 mg by mouth daily.    Marland Kitchen HYDROcodone-acetaminophen (NORCO/VICODIN) 5-325 MG per tablet   0  . levothyroxine (SYNTHROID, LEVOTHROID) 112 MCG tablet Take 112 mcg by mouth daily before breakfast.    . nitroGLYCERIN (  NITROSTAT) 0.4 MG SL tablet Place 1 tablet (0.4 mg total) under the tongue every 5 (five) minutes as needed for chest pain. 25 tablet 3  . potassium chloride (K-DUR) 10 MEQ tablet Take 10 mEq by mouth daily.    . potassium chloride (MICRO-K) 10 MEQ CR capsule     . psyllium (REGULOID) 0.52 G capsule Take 0.52 g by mouth 2 (two) times daily.    Marland Kitchen tiZANidine (ZANAFLEX) 2 MG tablet Take 1tab every 8 hours as needed for neck pain 30 tablet 0  . valACYclovir (VALTREX) 1000 MG tablet   3  . valsartan (DIOVAN) 80 MG tablet Take 80 mg by mouth 2 (two) times daily.    . verapamil (COVERA HS) 180 MG (CO) 24 hr tablet Take 180  mg by mouth at bedtime.    . verapamil (VERELAN PM) 180 MG 24 hr capsule      No current facility-administered medications on file prior to visit.    ALLERGIES: Allergies  Allergen Reactions  . Amoxicillin     Lip/throat swelling  . Aspirin Other (See Comments)    Baby aspirin is tolerated, itching in the past  . Percocet [Oxycodone-Acetaminophen]   . Prednisone Other (See Comments)    Heart palpitations  . Sulfonamide Derivatives Other (See Comments)    Unknown reaction a long time ago    FAMILY HISTORY: Family History  Problem Relation Age of Onset  . Brain cancer Other     1st degree relative  . Heart disease Maternal Uncle     x 4  . Colon cancer Mother   . Cancer Mother   . Varicose Veins Mother   . Heart attack Father 42  . Heart disease Father   . Heart disease Maternal Aunt     x 1  . Pancreatic cancer Neg Hx   . Prostate cancer Neg Hx   . Stomach cancer Neg Hx     pt notes that mother says she had "part of her stomach removed"  . Liver disease Neg Hx   . Kidney disease Neg Hx     SOCIAL HISTORY: Social History   Social History  . Marital Status: Married    Spouse Name: N/A  . Number of Children: 0  . Years of Education: N/A   Occupational History  . retired    Social History Main Topics  . Smoking status: Never Smoker   . Smokeless tobacco: Never Used     Comment: was a passive smoker for 15 yrs  . Alcohol Use: No  . Drug Use: No  . Sexual Activity: Not Currently   Other Topics Concern  . Not on file   Social History Narrative    REVIEW OF SYSTEMS: Constitutional: No fevers, chills, or sweats, no generalized fatigue, change in appetite Eyes: No visual changes, double vision, eye pain Ear, nose and throat: No hearing loss, ear pain, nasal congestion, sore throat Cardiovascular: No chest pain, palpitations Respiratory:  No shortness of breath at rest or with exertion, wheezes GastrointestinaI: No nausea, vomiting, diarrhea, abdominal  pain, fecal incontinence Genitourinary:  No dysuria, urinary retention or frequency Musculoskeletal:  Sometimes neck pain.  Back and right leg pain Integumentary: No rash, pruritus, skin lesions Neurological: as above Psychiatric: No depression, insomnia, anxiety Endocrine: No palpitations, fatigue, diaphoresis, mood swings, change in appetite, change in weight, increased thirst Hematologic/Lymphatic:  No anemia, purpura, petechiae. Allergic/Immunologic: no itchy/runny eyes, nasal congestion, recent allergic reactions, rashes  PHYSICAL EXAM: Filed Vitals:  08/15/15 1427  BP: 118/64  Pulse: 72  Resp: 18   [name]: No acute distress.  Patient appears well-groomed.  Head:  Normocephalic/atraumatic Eyes:  Fundoscopic exam unremarkable without vessel changes, exudates, hemorrhages or papilledema. Neck: supple, no paraspinal tenderness, full range of motion Heart:  Regular rate and rhythm Lungs:  Clear to auscultation bilaterally Back: No paraspinal tenderness Neurological Exam: alert and oriented to person, place, and time. Attention span and concentration intact, recent and remote memory intact, fund of knowledge intact.  Speech fluent and not dysarthric, language intact.  CN II-XII intact. Fundoscopic exam unremarkable without vessel changes, exudates, hemorrhages or papilledema.  Bulk and tone normal, muscle strength 5/5 throughout.  Sensation to light touch, temperature and vibration intact.  Deep tendon reflexes 2+ throughout, toes downgoing.  Finger to nose and heel to shin testing intact.  Gait normal.  IMPRESSION: Cervicogenic headache Neck pain  PLAN: Continue current management.  If they should worsen, consider PT or repeat MRI of the cervical spine given prior evidence of upper cervical radiculopathy. Follow up in 9 months.  15 minutes spent face to face with patient, over 50% spent discussing management.  Metta Clines, DO  CC:  Antony Blackbird, MD

## 2015-08-15 NOTE — Patient Instructions (Signed)
Take tizanidine as needed Follow up in 9 months.

## 2015-08-30 ENCOUNTER — Encounter: Payer: Self-pay | Admitting: Physical Therapy

## 2015-08-30 ENCOUNTER — Ambulatory Visit: Payer: Medicare Other | Attending: Neurosurgery | Admitting: Physical Therapy

## 2015-08-30 DIAGNOSIS — M545 Low back pain, unspecified: Secondary | ICD-10-CM

## 2015-08-30 NOTE — Patient Instructions (Signed)
Knee-to-Chest Stretch: Unilateral   With hand behind right knee, pull knee in to chest until a comfortable stretch is felt in lower back and buttocks. Keep back relaxed. Hold __15__ seconds. Repeat _2___ times per set. Do ___1_ sets per session. Do __1__ sessions per day.  http://orth.exer.us/126   Copyright  VHI. All rights reserved.  Lumbar Rotation (Non-Weight Bearing)   Feet on floor, slowly rock knees from side to side in small, pain-free range of motion. Allow lower back to rotate slightly. Repeat _10___ times per set. Do __1__ sets per session. Do __1__ sessions per day.  http://orth.exer.us/160   Copyright  VHI. All rights reserved.  Piriformis Stretch, Sitting   Sit, one ankle on opposite knee, same-side hand on crossed knee. Push down on knee, keeping spine straight. Lean torso forward, with flat back, until tension is felt in hamstrings and gluteals of crossed-leg side. Hold _30__ seconds.  Repeat _1__ times per session. Do _1__ sessions per day.  Copyright  VHI. All rights reserved.  [facility] [address] [place], [place] [address] Phone # [phone] Fax [phone]

## 2015-08-30 NOTE — Therapy (Signed)
[facility] [address], [place], [place], [address] Phone: [phone]   Fax:  [phone]  Physical Therapy Evaluation  Patient Details  Name: [name] [name] MRN: [id number] Date of Birth: 1933/11/29 Referring Provider:  Earnie Larsson, MD  Encounter Date: 08/30/2015      PT End of Session - 08/30/15 1606    Visit Number 1   Number of Visits 10  Medicare   Date for PT Re-Evaluation 10/11/15   PT Start Time 0962   PT Stop Time 1607   PT Time Calculation (min) 37 min   Activity Tolerance Patient tolerated treatment well   Behavior During Therapy [facility] for tasks assessed/performed      Past Medical History  Diagnosis Date  . Coronary artery disease   . Hyperlipidemia   . Orthostatic hypotension   . Hypertension   . Osteoarthrosis, unspecified whether generalized or localized, unspecified site   . Family history of malignant neoplasm of gastrointestinal tract   . Allergic rhinitis, cause unspecified   . Unspecified functional disorder of intestine   . H/O: hysterectomy   . Osteoporosis   . Anxiety and depression   . GERD (gastroesophageal reflux disease)   . Hypothyroidism   . Chronic headaches   . DJD (degenerative joint disease)     Past Surgical History  Procedure Laterality Date  . Cardiac catheterization    . Total knee arthroplasty Right   . Nasal sinus surgery    . Tonsillectomy    . Appendectomy    . Cyst removal on back    . Cervical spine surgery    . Knee surgery Left   . Abdominal hysterectomy    . Cataract extraction w/ intraocular lens implant Bilateral   . Ganglion cyst excision Bilateral     There were no vitals filed for this visit.  Visit Diagnosis:  Right-sided low back pain without sciatica - Plan: PT plan of care cert/re-cert      Subjective Assessment - 08/30/15 1542    Subjective Patient reports chronic back pain with sudden onset. Recent flare-up last August 2015 ,patient  was walking up driveway while a dog jumped on her and fell. Patient reports the past 6 months her back is worse.    How long can you stand comfortably? pain in back and right hip   How long can you walk comfortably? pain in back and right hip   Patient Stated Goals reduce pain with walking   Currently in Pain? Yes   Pain Score 10-Worst pain ever   Pain Location Back   Pain Orientation Right   Pain Descriptors / Indicators Sharp;Shooting   Pain Type Chronic pain   Pain Radiating Towards radiates into right hip   Pain Onset More than a month ago   Pain Frequency Intermittent   Aggravating Factors  sweeping, walking, and standing   Pain Relieving Factors lay down   Multiple Pain Sites No            [facility] PT Assessment - 08/30/15 0001    Assessment   Medical Diagnosis M43.16 spondylosistesis, lumbar regions   Onset Date/Surgical Date 02/28/15   Precautions   Precautions Other (comment)   Precaution Comments No joint mobilization or foward flexion    Balance Screen   Has the patient fallen in the past 6 months No   Is the patient reluctant to leave their home because of a fear of falling?  No   Prior Function  Level of Independence Independent   Cognition   Overall Cognitive Status Within Functional Limits for tasks assessed   Observation/Other Assessments   Focus on Therapeutic Outcomes (FOTO)  67% limitation  54% limitation   ROM / Strength   AROM / PROM / Strength AROM;Strength;PROM   AROM   Overall AROM Comments right sidebending  decreased by 25% with radiating pain into right hip   PROM   Overall PROM Comments right hip flexion 110 with pain and IR 5 with pain   Strength   Overall Strength Comments right hip flexion 3+/5, abduction 4/5,    Palpation   SI assessment  right ilium is anteriorly rotated,    Palpation comment Palpable tenderness located in right psoas, right quadratus, right SI joint, and right tensor fascia lata   Transfers   Five time sit to stand  comments  21 seconds with decreased sitting control   Ambulation/Gait   Ambulation/Gait Yes   Gait Comments ambulate with antaligic limp on right                           PT Education - 08/30/15 1605    Education provided Yes   Education Details SKC, trunk rotation, piriformis stretch   Person(s) Educated Patient   Methods Explanation;Handout;Demonstration   Comprehension Verbalized understanding;Returned demonstration          PT Short Term Goals - 08/30/15 1613    PT SHORT TERM GOAL #1   Title understand correct body mechanics with daily tasks   Time 3   Period Weeks   Status New           PT Long Term Goals - 08/30/15 1614    PT LONG TERM GOAL #1   Title independent with HEP and understand how to progress herselt   Time 6   Period Weeks   Status New   PT LONG TERM GOAL #2   Title pain with standing decreased >/= 40%   Time 6   Period Weeks   Status New   PT LONG TERM GOAL #3   Title pain with walking decreased >/= 40%   Time 6   Period Weeks   Status New   PT LONG TERM GOAL #4   Title FOTO score </= 54% limitaiton   Time 6   Period Weeks   Status New               Plan - 08/30/15 1607    Clinical Impression Statement Patient is a 81 year old female with diagnosis of Spondylolisthesis of lumbar region. Patient reports chronic lumbar pain.  She injured her back last August when she feel due to a dog jumping on her.  Patient reports her pain has been worse in the past 6 months.  Pain is intermittent at level 10/10 feeling like a sharp shooting pain with standing, walking.  FOTO score is 67% limitation.  Lumbar ROM for right sidebending decreased by 25% with pain radiating into right hip.  Right hip flexion PROM is 110 with pain and IR is 5 degrees.  Right hip flexion is 3+/5 and abduction is 4/5. Sit to stand test 5 times in 21 seconds. Right ilium is posteriorly rotated.  Palpable tenderness locted in right quadratus, right SI joint,  right tensor fascia latta, and right psoas. Patient would benefit from physical therapy to reduce pain in lumbar and right hip and increase strength.    Pt will benefit  from skilled therapeutic intervention in order to improve on the following deficits Pain;Decreased strength;Decreased mobility;Decreased balance;Improper body mechanics;Decreased endurance;Increased muscle spasms;Decreased range of motion   Rehab Potential Good   Clinical Impairments Affecting Rehab Potential None   PT Frequency 2x / week   PT Duration 6 weeks   PT Treatment/Interventions ADLs/Self Care Home Management;Moist Heat;Electrical Stimulation;Therapeutic exercise;Therapeutic activities;Ultrasound;Neuromuscular re-education;Patient/family education;Manual techniques;Passive range of motion   PT Next Visit Plan body mechanics wiith home tasks, soft tissue work to right back and psoas, modalities as needed, correct ilium, flexibility exercises    PT Home Exercise Plan body mechanics   Recommended Other Services None   Consulted and Agree with Plan of Care Patient          G-Codes - 09/21/15 1616    Functional Assessment Tool Used FOTO score is 67% limitaiton    Functional Limitation Other PT primary   Other PT Primary Current Status (O1751) At least 60 percent but less than 80 percent impaired, limited or restricted   Other PT Primary Goal Status (W2585) At least 40 percent but less than 60 percent impaired, limited or restricted       Problem List Patient Active Problem List   Diagnosis Date Noted  . Neck pain 08/15/2015  . Abdominal pain, chronic, right lower quadrant 05/08/2015  . History of colonic polyps 05/08/2015  . Varicose veins of leg with complications 27/78/2423  . Chronic venous insufficiency 04/28/2015  . Abdominal pain, unspecified site 09/20/14  . Family history of colon cancer 09/20/2014  . Rectal bleeding 20-Sep-2014  . Unspecified constipation 09-20-14  . Chest pain, non-cardiac  09/20/14  . Postthrombotic syndrome 06/23/2012  . Hemorrhage of rectum and anus 06/23/2012  . Other and unspecified coagulation defects 06/23/2012  . DVT, lower extremity 06/21/2012  . Hyponatremia 06/21/2012  . Internal hemorrhoids 07/30/2011  . Family history of malignant neoplasm of gastrointestinal tract 07/30/2011  . Chest pain, unspecified 05/02/2011  . CAD, NATIVE VESSEL 03/15/2010  . OTHER CONSTIPATION 01/22/2010  . HYPERLIPIDEMIA-MIXED 04/08/2009  . HYPOTENSION, ORTHOSTATIC 04/08/2009  . HYPERTENSION 08/12/2007  . ALLERGIC RHINITIS 08/12/2007  . OSTEOARTHRITIS 08/12/2007    Jaideep Pollack,PT 2015-09-21, 4:17 PM   [facility] [address] [place], [place], [address] Phone: [phone]   Fax:  [phone]

## 2015-09-05 ENCOUNTER — Other Ambulatory Visit: Payer: Self-pay | Admitting: Neurology

## 2015-09-05 ENCOUNTER — Ambulatory Visit: Payer: Medicare Other | Attending: Neurosurgery | Admitting: Rehabilitation

## 2015-09-05 DIAGNOSIS — M545 Low back pain, unspecified: Secondary | ICD-10-CM

## 2015-09-05 NOTE — Telephone Encounter (Signed)
Rx sent 

## 2015-09-05 NOTE — Therapy (Signed)
[facility] [address], [place], [place], [address] Phone: [phone]   Fax:  [phone]  Physical Therapy Treatment  Patient Details  Name: [name] [name] MRN: [id number] Date of Birth: 07-Aug-1933 Referring Provider:  Earnie Larsson, MD  Encounter Date: 09/05/2015      PT End of Session - 09/05/15 1708    Visit Number 2   Number of Visits 10   Date for PT Re-Evaluation 10/11/15   PT Start Time 1519   PT Stop Time 1603   PT Time Calculation (min) 44 min   Activity Tolerance Patient tolerated treatment well      Past Medical History  Diagnosis Date  . Coronary artery disease   . Hyperlipidemia   . Orthostatic hypotension   . Hypertension   . Osteoarthrosis, unspecified whether generalized or localized, unspecified site   . Family history of malignant neoplasm of gastrointestinal tract   . Allergic rhinitis, cause unspecified   . Unspecified functional disorder of intestine   . H/O: hysterectomy   . Osteoporosis   . Anxiety and depression   . GERD (gastroesophageal reflux disease)   . Hypothyroidism   . Chronic headaches   . DJD (degenerative joint disease)     Past Surgical History  Procedure Laterality Date  . Cardiac catheterization    . Total knee arthroplasty Right   . Nasal sinus surgery    . Tonsillectomy    . Appendectomy    . Cyst removal on back    . Cervical spine surgery    . Knee surgery Left   . Abdominal hysterectomy    . Cataract extraction w/ intraocular lens implant Bilateral   . Ganglion cyst excision Bilateral     There were no vitals filed for this visit.  Visit Diagnosis:  Right-sided low back pain without sciatica      Subjective Assessment - 09/05/15 1622    Subjective depends on what I am doing.  increased pain using the sweeper earlier. did not have pain for a few days and then it came back   Currently in Pain? No/denies                          [facility] Adult PT Treatment/Exercise - 09/05/15 0001    Self-Care   Self-Care --   Therapeutic Activites    Therapeutic Activities ADL's;Lifting   ADL's practicing sweeping with correct form and decreased lumbar flexion   Lifting lifting block off of floor with correct posture and form x 5   Exercises   Exercises Lumbar;Knee/Hip   Lumbar Exercises: Stretches   Single Knee to Chest Stretch 3 reps;20 seconds  right   Lower Trunk Rotation 5 reps;10 seconds   Piriformis Stretch 2 reps;20 seconds  seated   Lumbar Exercises: Aerobic   Stationary Bike nustep level 2 UE/LE x 9min   Manual Therapy   Manual Therapy Passive ROM;Soft tissue mobilization;Joint mobilization   Joint Mobilization lateral R hip distraction with belt combined with assive flexion to the hip with elimination of pain 10x3   Soft tissue mobilization R iliac crest border   Passive ROM to the R hip all directions as tolerated                  PT Short Term Goals - 08/30/15 1613    PT SHORT TERM GOAL #1   Title understand correct body mechanics with daily tasks   Time 3  Period Weeks   Status New           PT Long Term Goals - 08/30/15 1614    PT LONG TERM GOAL #1   Title independent with HEP and understand how to progress herselt   Time 6   Period Weeks   Status New   PT LONG TERM GOAL #2   Title pain with standing decreased >/= 40%   Time 6   Period Weeks   Status New   PT LONG TERM GOAL #3   Title pain with walking decreased >/= 40%   Time 6   Period Weeks   Status New   PT LONG TERM GOAL #4   Title FOTO score </= 54% limitaiton   Time 6   Period Weeks   Status New               Plan - 09/05/15 1708    Clinical Impression Statement Pt presents with much less pain today now localized to the R hip greater trochanter and the R iliac crest both near the bursa.  possible bursitis at the greater trochanter.  Did not see much in terms of pelvic rotation today.  no tenderness to the  low back.  hip flexion pain is eliminated with lateral joint glides.  seems to be eliminating soreness from the fall    PT Next Visit Plan continue R lateral hip, low back flexibility, STM, TE, possible bursitis treatment if lateral hip pain continues        Problem List Patient Active Problem List   Diagnosis Date Noted  . Neck pain 08/15/2015  . Abdominal pain, chronic, right lower quadrant 05/08/2015  . History of colonic polyps 05/08/2015  . Varicose veins of leg with complications 00/93/8182  . Chronic venous insufficiency 04/28/2015  . Abdominal pain, unspecified site 08/29/2014  . Family history of colon cancer 08/29/2014  . Rectal bleeding 08/29/2014  . Unspecified constipation 08/29/2014  . Chest pain, non-cardiac 08/29/2014  . Postthrombotic syndrome 06/23/2012  . Hemorrhage of rectum and anus 06/23/2012  . Other and unspecified coagulation defects 06/23/2012  . DVT, lower extremity 06/21/2012  . Hyponatremia 06/21/2012  . Internal hemorrhoids 07/30/2011  . Family history of malignant neoplasm of gastrointestinal tract 07/30/2011  . Chest pain, unspecified 05/02/2011  . CAD, NATIVE VESSEL 03/15/2010  . OTHER CONSTIPATION 01/22/2010  . HYPERLIPIDEMIA-MIXED 04/08/2009  . HYPOTENSION, ORTHOSTATIC 04/08/2009  . HYPERTENSION 08/12/2007  . ALLERGIC RHINITIS 08/12/2007  . OSTEOARTHRITIS 08/12/2007    Stark Bray, DPT, CMP 09/05/2015, 5:12 PM   [facility] [address], [place], [place], [address] Phone: [phone]   Fax:  [phone]

## 2015-09-08 ENCOUNTER — Encounter: Payer: Self-pay | Admitting: Rehabilitation

## 2015-09-08 ENCOUNTER — Ambulatory Visit: Payer: Medicare Other | Admitting: Rehabilitation

## 2015-09-08 DIAGNOSIS — M545 Low back pain, unspecified: Secondary | ICD-10-CM

## 2015-09-08 NOTE — Therapy (Signed)
[facility] [address] [place], [place], [address] Phone: [phone]   Fax:  [phone]  Physical Therapy Treatment  Patient Details  Name: [name] [name] MRN: [id number] Date of Birth: 1933-12-25 Referring Provider:  Earnie Larsson, MD  Encounter Date: 09/08/2015      PT End of Session - 09/08/15 1154    Visit Number 3   Number of Visits 10   Date for PT Re-Evaluation 10/11/15   PT Start Time 1100   PT Stop Time 1154   PT Time Calculation (min) 54 min      Past Medical History  Diagnosis Date  . Coronary artery disease   . Hyperlipidemia   . Orthostatic hypotension   . Hypertension   . Osteoarthrosis, unspecified whether generalized or localized, unspecified site   . Family history of malignant neoplasm of gastrointestinal tract   . Allergic rhinitis, cause unspecified   . Unspecified functional disorder of intestine   . H/O: hysterectomy   . Osteoporosis   . Anxiety and depression   . GERD (gastroesophageal reflux disease)   . Hypothyroidism   . Chronic headaches   . DJD (degenerative joint disease)     Past Surgical History  Procedure Laterality Date  . Cardiac catheterization    . Total knee arthroplasty Right   . Nasal sinus surgery    . Tonsillectomy    . Appendectomy    . Cyst removal on back    . Cervical spine surgery    . Knee surgery Left   . Abdominal hysterectomy    . Cataract extraction w/ intraocular lens implant Bilateral   . Ganglion cyst excision Bilateral     There were no vitals filed for this visit.  Visit Diagnosis:  Right-sided low back pain without sciatica      Subjective Assessment - 09/08/15 1108    Subjective felt better after last visit but started getting Right sided mid back pain after doing some cleaning around the house.  did have a bit of the radiating R thigh pain when sitting around last night.     Currently in Pain? No/denies                          [facility] Adult PT Treatment/Exercise - 09/08/15 0001    Lumbar Exercises: Stretches   Single Knee to Chest Stretch 3 reps;20 seconds   Double Knee to Chest Stretch 3 reps;20 seconds   Lower Trunk Rotation 5 reps;10 seconds   Piriformis Stretch 2 reps;20 seconds   Piriformis Stretch Limitations pain on the Right in the groin region   Lumbar Exercises: Aerobic   Stationary Bike nustep level 2 UE/LE x 37min   Lumbar Exercises: Supine   Bent Knee Raise 10 reps  bil   Bridge 10 reps   Knee/Hip Exercises: Stretches   Other Knee/Hip Stretches KTOS L 3x20"   Manual Therapy   Manual Therapy Passive ROM;Soft tissue mobilization;Joint mobilization   Joint Mobilization lateral R hip distraction with belt combined with assive flexion to the hip with elimination of pain 10x3   Soft tissue mobilization R iliac crest border   Passive ROM to the R hip all directions as tolerated                  PT Short Term Goals - 08/30/15 1613    PT SHORT TERM GOAL #1   Title understand correct body mechanics with daily tasks  Time 3   Period Weeks   Status New           PT Long Term Goals - 08/30/15 1614    PT LONG TERM GOAL #1   Title independent with HEP and understand how to progress herselt   Time 6   Period Weeks   Status New   PT LONG TERM GOAL #2   Title pain with standing decreased >/= 40%   Time 6   Period Weeks   Status New   PT LONG TERM GOAL #3   Title pain with walking decreased >/= 40%   Time 6   Period Weeks   Status New   PT LONG TERM GOAL #4   Title FOTO score </= 54% limitaiton   Time 6   Period Weeks   Status New               Plan - 09/08/15 1154    Clinical Impression Statement decreased tenderness to palpation iliac crest region. good tolerance to all new TE.          Problem List Patient Active Problem List   Diagnosis Date Noted  . Neck pain 08/15/2015  . Abdominal pain, chronic, right lower  quadrant 05/08/2015  . History of colonic polyps 05/08/2015  . Varicose veins of leg with complications 69/62/9528  . Chronic venous insufficiency 04/28/2015  . Abdominal pain, unspecified site 08/29/2014  . Family history of colon cancer 08/29/2014  . Rectal bleeding 08/29/2014  . Unspecified constipation 08/29/2014  . Chest pain, non-cardiac 08/29/2014  . Postthrombotic syndrome 06/23/2012  . Hemorrhage of rectum and anus 06/23/2012  . Other and unspecified coagulation defects 06/23/2012  . DVT, lower extremity 06/21/2012  . Hyponatremia 06/21/2012  . Internal hemorrhoids 07/30/2011  . Family history of malignant neoplasm of gastrointestinal tract 07/30/2011  . Chest pain, unspecified 05/02/2011  . CAD, NATIVE VESSEL 03/15/2010  . OTHER CONSTIPATION 01/22/2010  . HYPERLIPIDEMIA-MIXED 04/08/2009  . HYPOTENSION, ORTHOSTATIC 04/08/2009  . HYPERTENSION 08/12/2007  . ALLERGIC RHINITIS 08/12/2007  . OSTEOARTHRITIS 08/12/2007    Stark Bray, DPT, CMP 09/08/2015, 11:57 AM  [facility] [address], [place], [place], [address] Phone: [phone]   Fax:  [phone]

## 2015-09-13 ENCOUNTER — Ambulatory Visit: Payer: Medicare Other | Admitting: Physical Therapy

## 2015-09-13 ENCOUNTER — Encounter: Payer: Self-pay | Admitting: Physical Therapy

## 2015-09-13 DIAGNOSIS — M545 Low back pain, unspecified: Secondary | ICD-10-CM

## 2015-09-13 NOTE — Therapy (Signed)
[facility] [address] [place], [place], [address] Phone: [phone]   Fax:  [phone]  Physical Therapy Treatment  Patient Details  Name: [name] [name] MRN: [id number] Date of Birth: [birth year] Referring Provider:  Earnie Larsson, MD  Encounter Date: 09/13/2015      PT End of Session - 09/13/15 1427    Visit Number 4   Number of Visits 10   Date for PT Re-Evaluation 10/11/15   PT Start Time 1400   PT Stop Time 1440   PT Time Calculation (min) 40 min   Activity Tolerance Patient tolerated treatment well   Behavior During Therapy [facility] for tasks assessed/performed      Past Medical History  Diagnosis Date  . Coronary artery disease   . Hyperlipidemia   . Orthostatic hypotension   . Hypertension   . Osteoarthrosis, unspecified whether generalized or localized, unspecified site   . Family history of malignant neoplasm of gastrointestinal tract   . Allergic rhinitis, cause unspecified   . Unspecified functional disorder of intestine   . H/O: hysterectomy   . Osteoporosis   . Anxiety and depression   . GERD (gastroesophageal reflux disease)   . Hypothyroidism   . Chronic headaches   . DJD (degenerative joint disease)     Past Surgical History  Procedure Laterality Date  . Cardiac catheterization    . Total knee arthroplasty Right   . Nasal sinus surgery    . Tonsillectomy    . Appendectomy    . Cyst removal on back    . Cervical spine surgery    . Knee surgery Left   . Abdominal hysterectomy    . Cataract extraction w/ intraocular lens implant Bilateral   . Ganglion cyst excision Bilateral     There were no vitals filed for this visit.  Visit Diagnosis:  Right-sided low back pain without sciatica      Subjective Assessment - 09/13/15 1408    Subjective I feel better. Sunday night I was able to walk without pain. Patient reports her pain is 40% better.    How long can you stand comfortably?  pain in back and right hip   How long can you walk comfortably? pain in back and right hip   Patient Stated Goals reduce pain with walking   Currently in Pain? No/denies            [facility] PT Assessment - 09/13/15 0001    PROM   Overall PROM Comments right hip flexion 120 and IR 25   Strength   Overall Strength Comments right hip fleixon 5/5, abduction 4/5   Palpation   SI assessment  pelvis in correct alignment                     [facility] Adult PT Treatment/Exercise - 09/13/15 0001    Lumbar Exercises: Stretches   Single Knee to Chest Stretch 3 reps;20 seconds   Double Knee to Chest Stretch 3 reps;20 seconds   Lower Trunk Rotation 5 reps;10 seconds   Lumbar Exercises: Supine   Clam 20 reps  5 times with therapist resisting IR to reduce inner groin pa   Bent Knee Raise 10 reps  bil   Bent Knee Raise Limitations 2 sets   Lumbar Exercises: Sidelying   Clam 10 reps   Clam Limitations hard to keep pelvis still   Knee/Hip Exercises: Standing   Hip ADduction Strengthening;Right;Left;2 sets;10 reps  yellow band  Manual Therapy   Manual Therapy Passive ROM;Soft tissue mobilization;Joint mobilization   Joint Mobilization lateral R hip distraction with belt combined with assive flexion to the hip with elimination of pain 10x3   Passive ROM to the R hip all directions as tolerated                PT Education - 09/13/15 1439    Education provided No          PT Short Term Goals - 08/30/15 1613    PT SHORT TERM GOAL #1   Title understand correct body mechanics with daily tasks   Time 3   Period Weeks   Status New           PT Long Term Goals - 09/13/15 1439    PT LONG TERM GOAL #1   Title independent with HEP and understand how to progress herselt   Time 6   Period Weeks   Status New  still learning   PT LONG TERM GOAL #2   Title pain with standing decreased >/= 40%   Time 6   Period Weeks   Status Achieved   PT LONG TERM GOAL #3   Title  pain with walking decreased >/= 40%   Time 6   Period Weeks   Status On-going  15% better   PT LONG TERM GOAL #4   Title FOTO score </= 54% limitaiton   Time 6   Period Weeks   Status On-going               Plan - 09/13/15 1440    Clinical Impression Statement Patient has increased right hip flexion to 120 degrees and internal rotation to 25 degrees.  Patient pelvis is leveled.  Patient reports her pain has decreased by 40%.  Patient right hip abduction strength is 4-/5, flexion is 5/5. Patient has met LTG # 2 due to pain with standing decreased by 45%.  Patient would benefit from physical therapy to imporve right hip strength and core strength.    Pt will benefit from skilled therapeutic intervention in order to improve on the following deficits Pain;Decreased strength;Decreased mobility;Decreased balance;Improper body mechanics;Decreased endurance;Increased muscle spasms;Decreased range of motion   Rehab Potential Good   Clinical Impairments Affecting Rehab Potential None   PT Frequency 2x / week   PT Duration 6 weeks   PT Treatment/Interventions ADLs/Self Care Home Management;Moist Heat;Electrical Stimulation;Therapeutic exercise;Therapeutic activities;Ultrasound;Neuromuscular re-education;Patient/family education;Manual techniques;Passive range of motion   PT Next Visit Plan continue R lateral hip, low back flexibility, STM, TE, possible bursitis treatment if lateral hip pain continues; see what MD says   PT Home Exercise Plan body mechanics   Consulted and Agree with Plan of Care Patient        Problem List Patient Active Problem List   Diagnosis Date Noted  . Neck pain 08/15/2015  . Abdominal pain, chronic, right lower quadrant 05/08/2015  . History of colonic polyps 05/08/2015  . Varicose veins of leg with complications 82/42/3536  . Chronic venous insufficiency 04/28/2015  . Abdominal pain, unspecified site 08/29/2014  . Family history of colon cancer 08/29/2014   . Rectal bleeding 08/29/2014  . Unspecified constipation 08/29/2014  . Chest pain, non-cardiac 08/29/2014  . Postthrombotic syndrome 06/23/2012  . Hemorrhage of rectum and anus 06/23/2012  . Other and unspecified coagulation defects 06/23/2012  . DVT, lower extremity 06/21/2012  . Hyponatremia 06/21/2012  . Internal hemorrhoids 07/30/2011  . Family history of malignant neoplasm of gastrointestinal tract  07/30/2011  . Chest pain, unspecified 05/02/2011  . CAD, NATIVE VESSEL 03/15/2010  . OTHER CONSTIPATION 01/22/2010  . HYPERLIPIDEMIA-MIXED 04/08/2009  . HYPOTENSION, ORTHOSTATIC 04/08/2009  . HYPERTENSION 08/12/2007  . ALLERGIC RHINITIS 08/12/2007  . OSTEOARTHRITIS 08/12/2007    GRAY,CHERYL,PT 09/13/2015, 2:43 PM  [facility] [address], [place], [place], [address] Phone: [phone]   Fax:  [phone]

## 2015-09-14 DIAGNOSIS — M4316 Spondylolisthesis, lumbar region: Secondary | ICD-10-CM | POA: Diagnosis not present

## 2015-09-14 DIAGNOSIS — M5416 Radiculopathy, lumbar region: Secondary | ICD-10-CM | POA: Diagnosis not present

## 2015-09-18 ENCOUNTER — Encounter: Payer: Self-pay | Admitting: Physical Therapy

## 2015-09-18 ENCOUNTER — Ambulatory Visit: Payer: Medicare Other | Admitting: Physical Therapy

## 2015-09-18 DIAGNOSIS — M545 Low back pain, unspecified: Secondary | ICD-10-CM

## 2015-09-18 NOTE — Therapy (Signed)
[facility] [address], [place], [place], [address] Phone: [phone]   Fax:  [phone]  Physical Therapy Treatment  Patient Details  Name: [name] [name] MRN: [id number] Date of Birth: 02/11/1933 Referring Provider:  Earnie Larsson, MD  Encounter Date: 09/18/2015      PT End of Session - 09/18/15 1515    Visit Number 5   Number of Visits 10   Date for PT Re-Evaluation 10/11/15   PT Start Time 5916   PT Stop Time 1530   PT Time Calculation (min) 42 min   Behavior During Therapy [facility] for tasks assessed/performed      Past Medical History  Diagnosis Date  . Coronary artery disease   . Hyperlipidemia   . Orthostatic hypotension   . Hypertension   . Osteoarthrosis, unspecified whether generalized or localized, unspecified site   . Family history of malignant neoplasm of gastrointestinal tract   . Allergic rhinitis, cause unspecified   . Unspecified functional disorder of intestine   . H/O: hysterectomy   . Osteoporosis   . Anxiety and depression   . GERD (gastroesophageal reflux disease)   . Hypothyroidism   . Chronic headaches   . DJD (degenerative joint disease)     Past Surgical History  Procedure Laterality Date  . Cardiac catheterization    . Total knee arthroplasty Right   . Nasal sinus surgery    . Tonsillectomy    . Appendectomy    . Cyst removal on back    . Cervical spine surgery    . Knee surgery Left   . Abdominal hysterectomy    . Cataract extraction w/ intraocular lens implant Bilateral   . Ganglion cyst excision Bilateral     There were no vitals filed for this visit.  Visit Diagnosis:  Right-sided low back pain without sciatica      Subjective Assessment - 09/18/15 1451    Subjective Received injection last week. This injection helped her back. She has not have much back pain since injection. Since then she has felt nauseous.     Currently in Pain? No/denies   Aggravating  Factors  Nothing as of late   Pain Relieving Factors Injection helped   Multiple Pain Sites No                         [facility] Adult PT Treatment/Exercise - 09/18/15 0001    Lumbar Exercises: Aerobic   Stationary Bike Nu step L1 x 6 min   Lumbar Exercises: Supine   Bent Knee Raise 10 reps  bil, VC for TA   Bent Knee Raise Limitations 2 sets   Other Supine Lumbar Exercises Adduction ball squeeze with TA contraction 10x   Other Supine Lumbar Exercises Supine hip ER with red band 10 x   Lumbar Exercises: Sidelying   Clam 10 reps   Clam Limitations hard to keep pelvis still                  PT Short Term Goals - 08/30/15 1613    PT SHORT TERM GOAL #1   Title understand correct body mechanics with daily tasks   Time 3   Period Weeks   Status New           PT Long Term Goals - 09/18/15 1515    PT LONG TERM GOAL #1   Title independent with HEP and understand how to progress herselt  Time 6   Period Weeks   Status New   PT LONG TERM GOAL #2   Title pain with standing decreased >/= 40%   Time 6   Period Weeks   Status Achieved   PT LONG TERM GOAL #3   Title pain with walking decreased >/= 40%   Time 6   Period Weeks   Status Achieved  50%   PT LONG TERM GOAL #4   Title FOTO score </= 54% limitaiton   Time 6   Period Weeks   Status On-going  Will do on visit #10               Plan - 09/18/15 1519    Clinical Impression Statement Pt received injection on Thursday of last week and has minimal to no pain since. Performed core strengthening most of todays session. Pt has a difficult time isolating her transverse abdominus in supine and  sidelying. Pt was able to Er at hips better in supine against a red band resistancetha in sidelying.     Pt will benefit from skilled therapeutic intervention in order to improve on the following deficits Pain;Decreased strength;Decreased mobility;Decreased balance;Improper body mechanics;Decreased  endurance;Increased muscle spasms;Decreased range of motion   Rehab Potential Good   Clinical Impairments Affecting Rehab Potential None   PT Duration 6 weeks   PT Treatment/Interventions ADLs/Self Care Home Management;Moist Heat;Electrical Stimulation;Therapeutic exercise;Therapeutic activities;Ultrasound;Neuromuscular re-education;Patient/family education;Manual techniques;Passive range of motion   PT Next Visit Plan Hip/core strength and stabilization ex. Pt likes the Nustep.   Consulted and Agree with Plan of Care Patient        Problem List Patient Active Problem List   Diagnosis Date Noted  . Neck pain 08/15/2015  . Abdominal pain, chronic, right lower quadrant 05/08/2015  . History of colonic polyps 05/08/2015  . Varicose veins of leg with complications 26/83/4196  . Chronic venous insufficiency 04/28/2015  . Abdominal pain, unspecified site 08/29/2014  . Family history of colon cancer 08/29/2014  . Rectal bleeding 08/29/2014  . Unspecified constipation 08/29/2014  . Chest pain, non-cardiac 08/29/2014  . Postthrombotic syndrome 06/23/2012  . Hemorrhage of rectum and anus 06/23/2012  . Other and unspecified coagulation defects 06/23/2012  . DVT, lower extremity 06/21/2012  . Hyponatremia 06/21/2012  . Internal hemorrhoids 07/30/2011  . Family history of malignant neoplasm of gastrointestinal tract 07/30/2011  . Chest pain, unspecified 05/02/2011  . CAD, NATIVE VESSEL 03/15/2010  . OTHER CONSTIPATION 01/22/2010  . HYPERLIPIDEMIA-MIXED 04/08/2009  . HYPOTENSION, ORTHOSTATIC 04/08/2009  . HYPERTENSION 08/12/2007  . ALLERGIC RHINITIS 08/12/2007  . OSTEOARTHRITIS 08/12/2007    COCHRAN,JENNIFER, PTA 09/18/2015, 3:23 PM  [facility] [address], [place], [place], [address] Phone: [phone]   Fax:  [phone]

## 2015-09-20 ENCOUNTER — Encounter: Payer: Self-pay | Admitting: Physical Therapy

## 2015-09-20 ENCOUNTER — Ambulatory Visit: Payer: Medicare Other | Admitting: Physical Therapy

## 2015-09-20 DIAGNOSIS — M545 Low back pain, unspecified: Secondary | ICD-10-CM

## 2015-09-20 NOTE — Therapy (Signed)
[name] [address] [place], [place], [address] Phone: [phone]   Fax:  [phone]  Physical Therapy Treatment  Patient Details  Name: [name] [name] MRN: [id number] Date of Birth: 1933-02-13 Referring Provider:  Earnie Larsson, MD  Encounter Date: 09/20/2015      PT End of Session - 09/20/15 1459    Visit Number 6   Number of Visits 10   Date for PT Re-Evaluation 10/11/15   PT Start Time 0630   PT Stop Time 1530   PT Time Calculation (min) 44 min   Activity Tolerance Patient tolerated treatment well   Behavior During Therapy [facility] for tasks assessed/performed      Past Medical History  Diagnosis Date  . Coronary artery disease   . Hyperlipidemia   . Orthostatic hypotension   . Hypertension   . Osteoarthrosis, unspecified whether generalized or localized, unspecified site   . Family history of malignant neoplasm of gastrointestinal tract   . Allergic rhinitis, cause unspecified   . Unspecified functional disorder of intestine   . H/O: hysterectomy   . Osteoporosis   . Anxiety and depression   . GERD (gastroesophageal reflux disease)   . Hypothyroidism   . Chronic headaches   . DJD (degenerative joint disease)     Past Surgical History  Procedure Laterality Date  . Cardiac catheterization    . Total knee arthroplasty Right   . Nasal sinus surgery    . Tonsillectomy    . Appendectomy    . Cyst removal on back    . Cervical spine surgery    . Knee surgery Left   . Abdominal hysterectomy    . Cataract extraction w/ intraocular lens implant Bilateral   . Ganglion cyst excision Bilateral     There were no vitals filed for this visit.  Visit Diagnosis:  Right-sided low back pain without sciatica      Subjective Assessment - 09/20/15 1453    Subjective Pt still complains about feeling nauseous, but back still feels better.    Currently in Pain? No/denies                          [facility] Adult PT Treatment/Exercise - 09/20/15 0001    Lumbar Exercises: Stretches   Single Knee to Chest Stretch 3 reps;20 seconds   Double Knee to Chest Stretch --   Lower Trunk Rotation --   Lumbar Exercises: Aerobic   Stationary Bike Nu step L1 x 6 min   Lumbar Exercises: Supine   Bent Knee Raise 10 reps  bil vc's for TA activation   Bent Knee Raise Limitations 2 sets   Other Supine Lumbar Exercises Adduction ball squeeze with TA contraction 10x   Other Supine Lumbar Exercises Supine hip ER with red band 10 x   Lumbar Exercises: Sidelying   Clam 10 reps   Clam Limitations hard to keep pelvis still                  PT Short Term Goals - 08/30/15 1613    PT SHORT TERM GOAL #1   Title understand correct body mechanics with daily tasks   Time 3   Period Weeks   Status New           PT Long Term Goals - 09/18/15 1515    PT LONG TERM GOAL #1   Title independent with HEP and understand how to progress herselt  Time 6   Period Weeks   Status New   PT LONG TERM GOAL #2   Title pain with standing decreased >/= 40%   Time 6   Period Weeks   Status Achieved   PT LONG TERM GOAL #3   Title pain with walking decreased >/= 40%   Time 6   Period Weeks   Status Achieved  50%   PT LONG TERM GOAL #4   Title FOTO score </= 54% limitaiton   Time 6   Period Weeks   Status On-going  Will do on visit #10               Plan - 09/20/15 1500    Clinical Impression Statement Pt has minimal to no pain in low back since she recieved injection on Sept 8th. pt needs vc/'s and tactile cues for technique of TA activation. Pt will continue to benefit from skilled PT   Pt will benefit from skilled therapeutic intervention in order to improve on the following deficits Pain;Decreased strength;Decreased mobility;Decreased balance;Improper body mechanics;Decreased endurance;Increased muscle spasms;Decreased range of motion   Rehab  Potential Good   PT Frequency 2x / week   PT Duration 6 weeks   PT Treatment/Interventions ADLs/Self Care Home Management;Moist Heat;Electrical Stimulation;Therapeutic exercise;Therapeutic activities;Ultrasound;Neuromuscular re-education;Patient/family education;Manual techniques;Passive range of motion   PT Next Visit Plan Hip/core strength and stabilization ex. Pt likes the Nustep.   PT Home Exercise Plan add as needed   Consulted and Agree with Plan of Care Patient        Problem List Patient Active Problem List   Diagnosis Date Noted  . Neck pain 08/15/2015  . Abdominal pain, chronic, right lower quadrant 05/08/2015  . History of colonic polyps 05/08/2015  . Varicose veins of leg with complications 77/41/4239  . Chronic venous insufficiency 04/28/2015  . Abdominal pain, unspecified site 08/29/2014  . Family history of colon cancer 08/29/2014  . Rectal bleeding 08/29/2014  . Unspecified constipation 08/29/2014  . Chest pain, non-cardiac 08/29/2014  . Postthrombotic syndrome 06/23/2012  . Hemorrhage of rectum and anus 06/23/2012  . Other and unspecified coagulation defects 06/23/2012  . DVT, lower extremity 06/21/2012  . Hyponatremia 06/21/2012  . Internal hemorrhoids 07/30/2011  . Family history of malignant neoplasm of gastrointestinal tract 07/30/2011  . Chest pain, unspecified 05/02/2011  . CAD, NATIVE VESSEL 03/15/2010  . OTHER CONSTIPATION 01/22/2010  . HYPERLIPIDEMIA-MIXED 04/08/2009  . HYPOTENSION, ORTHOSTATIC 04/08/2009  . HYPERTENSION 08/12/2007  . ALLERGIC RHINITIS 08/12/2007  . OSTEOARTHRITIS 08/12/2007    [name] [name] PTA 09/20/2015, 3:16 PM  [name] [address], [place], [place], [address] Phone: [phone]   Fax:  [phone]

## 2015-09-25 ENCOUNTER — Encounter: Payer: Self-pay | Admitting: Physical Therapy

## 2015-09-25 ENCOUNTER — Ambulatory Visit: Payer: Medicare Other | Admitting: Physical Therapy

## 2015-09-25 DIAGNOSIS — M545 Low back pain, unspecified: Secondary | ICD-10-CM

## 2015-09-25 NOTE — Therapy (Signed)
[facility] [address] [place], [place], [address] Phone: [phone]   Fax:  [phone]  Physical Therapy Treatment  Patient Details  Name: [name] [name] MRN: [id number] Date of Birth: 1933-08-07 Referring Provider:  Earnie Larsson, MD  Encounter Date: 09/25/2015      PT End of Session - 09/25/15 1513    Visit Number 7   Number of Visits 10   Date for PT Re-Evaluation 10/11/15   PT Start Time 6073   PT Stop Time 1530   PT Time Calculation (min) 31 min   Activity Tolerance Patient tolerated treatment well   Behavior During Therapy [facility] for tasks assessed/performed      Past Medical History  Diagnosis Date  . Coronary artery disease   . Hyperlipidemia   . Orthostatic hypotension   . Hypertension   . Osteoarthrosis, unspecified whether generalized or localized, unspecified site   . Family history of malignant neoplasm of gastrointestinal tract   . Allergic rhinitis, cause unspecified   . Unspecified functional disorder of intestine   . H/O: hysterectomy   . Osteoporosis   . Anxiety and depression   . GERD (gastroesophageal reflux disease)   . Hypothyroidism   . Chronic headaches   . DJD (degenerative joint disease)     Past Surgical History  Procedure Laterality Date  . Cardiac catheterization    . Total knee arthroplasty Right   . Nasal sinus surgery    . Tonsillectomy    . Appendectomy    . Cyst removal on back    . Cervical spine surgery    . Knee surgery Left   . Abdominal hysterectomy    . Cataract extraction w/ intraocular lens implant Bilateral   . Ganglion cyst excision Bilateral     There were no vitals filed for this visit.  Visit Diagnosis:  Right-sided low back pain without sciatica      Subjective Assessment - 09/25/15 1508    Subjective Pt reports her back is 75% better. Pt still feels slightly  nauseaited, but getting better   Currently in Pain? No/denies   Pain Score --   only sometimes with certain movements                         [facility] Adult PT Treatment/Exercise - 09/25/15 0001    Lumbar Exercises: Stretches   Single Knee to Chest Stretch 3 reps;20 seconds   Double Knee to Chest Stretch 3 reps;20 seconds  using strap   Lower Trunk Rotation 5 reps;10 seconds   Lumbar Exercises: Aerobic   Stationary Bike Nu step L2 x 6 min  seat #7, arms #8   Lumbar Exercises: Supine   Bridge 10 reps   Other Supine Lumbar Exercises Adduction ball squeeze with TA contraction 10x   Other Supine Lumbar Exercises Supine hip ER with red band 10 x   Lumbar Exercises: Sidelying   Clam 10 reps  each side   Clam Limitations hard to keep pelvis still                  PT Short Term Goals - 09/25/15 1517    PT SHORT TERM GOAL #1   Title understand correct body mechanics with daily tasks   Time 3   Period Weeks   Status On-going           PT Long Term Goals - 09/25/15 1517    PT LONG TERM  GOAL #1   Title independent with HEP and understand how to progress herselt   Time 6   Period Weeks   Status On-going   PT LONG TERM GOAL #2   Title pain with standing decreased >/= 40%   Time 6   Period Weeks   Status Achieved   PT LONG TERM GOAL #3   Title pain with walking decreased >/= 40%   Time 6   Period Weeks   Status Achieved   PT LONG TERM GOAL #4   Title FOTO score </= 54% limitaiton   Period Weeks   Status On-going               Plan - 09/25/15 1513    Clinical Impression Statement Pt was able to perform TA activities but needs tactile and vc's. Pt will continue to benefit from skilled PT   Pt will benefit from skilled therapeutic intervention in order to improve on the following deficits Pain;Decreased strength;Decreased mobility;Decreased balance;Improper body mechanics;Decreased endurance;Increased muscle spasms;Decreased range of motion   Rehab Potential Good   PT Frequency 2x / week   PT Duration 6 weeks   PT  Treatment/Interventions ADLs/Self Care Home Management;Moist Heat;Electrical Stimulation;Therapeutic exercise;Therapeutic activities;Ultrasound;Neuromuscular re-education;Patient/family education;Manual techniques;Passive range of motion   PT Next Visit Plan Hip/core strength and stabilization ex. Pt likes the Nustep.   PT Home Exercise Plan add as needed   Consulted and Agree with Plan of Care Patient        Problem List Patient Active Problem List   Diagnosis Date Noted  . Neck pain 08/15/2015  . Abdominal pain, chronic, right lower quadrant 05/08/2015  . History of colonic polyps 05/08/2015  . Varicose veins of leg with complications 54/49/2010  . Chronic venous insufficiency 04/28/2015  . Abdominal pain, unspecified site 08/29/2014  . Family history of colon cancer 08/29/2014  . Rectal bleeding 08/29/2014  . Unspecified constipation 08/29/2014  . Chest pain, non-cardiac 08/29/2014  . Postthrombotic syndrome 06/23/2012  . Hemorrhage of rectum and anus 06/23/2012  . Other and unspecified coagulation defects 06/23/2012  . DVT, lower extremity 06/21/2012  . Hyponatremia 06/21/2012  . Internal hemorrhoids 07/30/2011  . Family history of malignant neoplasm of gastrointestinal tract 07/30/2011  . Chest pain, unspecified 05/02/2011  . CAD, NATIVE VESSEL 03/15/2010  . OTHER CONSTIPATION 01/22/2010  . HYPERLIPIDEMIA-MIXED 04/08/2009  . HYPOTENSION, ORTHOSTATIC 04/08/2009  . HYPERTENSION 08/12/2007  . ALLERGIC RHINITIS 08/12/2007  . OSTEOARTHRITIS 08/12/2007    NAUMANN-HOUEGNIFIO,ELKE PTA 09/25/2015, 3:29 PM  [facility] [address] [place], [place], [address] Phone: [phone]   Fax:  [phone]

## 2015-09-27 ENCOUNTER — Ambulatory Visit: Payer: Medicare Other

## 2015-09-27 DIAGNOSIS — M545 Low back pain, unspecified: Secondary | ICD-10-CM

## 2015-09-27 NOTE — Patient Instructions (Signed)
   Stretching: Piriformis (Supine)  Pull right knee toward opposite shoulder. Hold __20__ seconds. Relax. Repeat _3___ times per set. Do __1__ sets per session. Do _3___ sessions per day.  Bridge   Lie back, legs bent. Inhale, pressing hips up. Keeping ribs in, lengthen lower back. Hold 5 seconds. Exhale, rolling down along spine from top.  Repeat __10_ times. Do _1___ sessions per day.  Copyright  VHI. All rights reserved.  [place] [place] [address], [place], [place] [address] Phone # [phone] Fax [phone]

## 2015-09-27 NOTE — Therapy (Signed)
[facility] [address], [place], [place], [address] Phone: [phone]   Fax:  [phone]  Physical Therapy Treatment  Patient Details  Name: [name] [name] MRN: [id number] Date of Birth: October 14, 1933 Referring Cason Luffman:  Earnie Larsson, MD  Encounter Date: 09/27/2015      PT End of Session - 09/27/15 1440    Visit Number 8   Number of Visits 10   Date for PT Re-Evaluation 10/11/15   PT Start Time 1402   PT Stop Time 1443   PT Time Calculation (min) 41 min   Activity Tolerance Patient tolerated treatment well   Behavior During Therapy [facility] for tasks assessed/performed      Past Medical History  Diagnosis Date  . Coronary artery disease   . Hyperlipidemia   . Orthostatic hypotension   . Hypertension   . Osteoarthrosis, unspecified whether generalized or localized, unspecified site   . Family history of malignant neoplasm of gastrointestinal tract   . Allergic rhinitis, cause unspecified   . Unspecified functional disorder of intestine   . H/O: hysterectomy   . Osteoporosis   . Anxiety and depression   . GERD (gastroesophageal reflux disease)   . Hypothyroidism   . Chronic headaches   . DJD (degenerative joint disease)     Past Surgical History  Procedure Laterality Date  . Cardiac catheterization    . Total knee arthroplasty Right   . Nasal sinus surgery    . Tonsillectomy    . Appendectomy    . Cyst removal on back    . Cervical spine surgery    . Knee surgery Left   . Abdominal hysterectomy    . Cataract extraction w/ intraocular lens implant Bilateral   . Ganglion cyst excision Bilateral     There were no vitals filed for this visit.  Visit Diagnosis:  Right-sided low back pain without sciatica      Subjective Assessment - 09/27/15 1401    Subjective Back is feeling good.  No pain.  Pt hasn't had time to do exercises.     Currently in Pain? No/denies                          [facility] Adult PT Treatment/Exercise - 09/27/15 0001    Lumbar Exercises: Stretches   Active Hamstring Stretch 3 reps;20 seconds  seated and with strap in supine   Single Knee to Chest Stretch 3 reps;20 seconds   Double Knee to Chest Stretch 3 reps;20 seconds  using strap   Lower Trunk Rotation 5 reps;10 seconds   Piriformis Stretch 3 reps;20 seconds  diagonal knee to chest   Lumbar Exercises: Aerobic   Stationary Bike Nu step L2 x 8 min  seat #7, arms #8   Lumbar Exercises: Supine   Bridge 10 reps   Lumbar Exercises: Sidelying   Clam 10 reps  each side   Clam Limitations tactile cues to stabilize pelvis                PT Education - 09/27/15 1435    Education provided Yes   Education Details HEP: diagonal knee to chest and bridging   Person(s) Educated Patient   Methods Explanation;Demonstration;Handout   Comprehension Verbalized understanding;Returned demonstration          PT Short Term Goals - 09/25/15 1517    PT SHORT TERM GOAL #1   Title understand correct body mechanics with daily tasks  Time 3   Period Weeks   Status On-going           PT Long Term Goals - 09/25/15 1517    PT LONG TERM GOAL #1   Title independent with HEP and understand how to progress herselt   Time 6   Period Weeks   Status On-going   PT LONG TERM GOAL #2   Title pain with standing decreased >/= 40%   Time 6   Period Weeks   Status Achieved   PT LONG TERM GOAL #3   Title pain with walking decreased >/= 40%   Time 6   Period Weeks   Status Achieved   PT LONG TERM GOAL #4   Title FOTO score </= 54% limitaiton   Period Weeks   Status On-going               Plan - 09/27/15 1413    Clinical Impression Statement Pt without pain today.  90% overall improvement reported since the start of care.  Pt has not been able to be compliant with HEP due to time constraints.  Pt will benefit from continued PT for core strength, flexibility and endurance.     Pt will  benefit from skilled therapeutic intervention in order to improve on the following deficits Pain;Decreased strength;Decreased mobility;Decreased balance;Improper body mechanics;Decreased endurance;Increased muscle spasms;Decreased range of motion   Rehab Potential Good   PT Frequency 2x / week   PT Duration 6 weeks   PT Treatment/Interventions ADLs/Self Care Home Management;Moist Heat;Electrical Stimulation;Therapeutic exercise;Therapeutic activities;Ultrasound;Neuromuscular re-education;Patient/family education;Manual techniques;Passive range of motion   PT Next Visit Plan Hip/core strength and stabilization ex. Hip/lumbar flexibility.   Consulted and Agree with Plan of Care Patient        Problem List Patient Active Problem List   Diagnosis Date Noted  . Neck pain 08/15/2015  . Abdominal pain, chronic, right lower quadrant 05/08/2015  . History of colonic polyps 05/08/2015  . Varicose veins of leg with complications 63/33/5456  . Chronic venous insufficiency 04/28/2015  . Abdominal pain, unspecified site 08/29/2014  . Family history of colon cancer 08/29/2014  . Rectal bleeding 08/29/2014  . Unspecified constipation 08/29/2014  . Chest pain, non-cardiac 08/29/2014  . Postthrombotic syndrome 06/23/2012  . Hemorrhage of rectum and anus 06/23/2012  . Other and unspecified coagulation defects 06/23/2012  . DVT, lower extremity 06/21/2012  . Hyponatremia 06/21/2012  . Internal hemorrhoids 07/30/2011  . Family history of malignant neoplasm of gastrointestinal tract 07/30/2011  . Chest pain, unspecified 05/02/2011  . CAD, NATIVE VESSEL 03/15/2010  . OTHER CONSTIPATION 01/22/2010  . HYPERLIPIDEMIA-MIXED 04/08/2009  . HYPOTENSION, ORTHOSTATIC 04/08/2009  . HYPERTENSION 08/12/2007  . ALLERGIC RHINITIS 08/12/2007  . OSTEOARTHRITIS 08/12/2007    TAKACS,KELLY, PT 09/27/2015, 2:43 PM  [facility] [address], [place], [place], [address] Phone: [phone]   Fax:  [phone]

## 2015-10-02 ENCOUNTER — Encounter: Payer: Self-pay | Admitting: Physical Therapy

## 2015-10-02 ENCOUNTER — Ambulatory Visit: Payer: Medicare Other | Attending: Neurosurgery | Admitting: Physical Therapy

## 2015-10-02 DIAGNOSIS — M545 Low back pain, unspecified: Secondary | ICD-10-CM

## 2015-10-02 NOTE — Therapy (Signed)
[facility] [address], [place], [place], [address] Phone: [phone]   Fax:  [phone]  Physical Therapy Treatment  Patient Details  Name: [name] [name] MRN: [id number] Date of Birth: 1933/09/15 Referring Provider:  Earnie Larsson, MD  Encounter Date: 10/02/2015      PT End of Session - 10/02/15 1518    Visit Number 9   Number of Visits 10   Date for PT Re-Evaluation 10/11/15   PT Start Time 5093   PT Stop Time 1525   PT Time Calculation (min) 40 min   Activity Tolerance Patient tolerated treatment well   Behavior During Therapy [facility] for tasks assessed/performed      Past Medical History  Diagnosis Date  . Coronary artery disease   . Hyperlipidemia   . Orthostatic hypotension   . Hypertension   . Osteoarthrosis, unspecified whether generalized or localized, unspecified site   . Family history of malignant neoplasm of gastrointestinal tract   . Allergic rhinitis, cause unspecified   . Unspecified functional disorder of intestine   . H/O: hysterectomy   . Osteoporosis   . Anxiety and depression   . GERD (gastroesophageal reflux disease)   . Hypothyroidism   . Chronic headaches   . DJD (degenerative joint disease)     Past Surgical History  Procedure Laterality Date  . Cardiac catheterization    . Total knee arthroplasty Right   . Nasal sinus surgery    . Tonsillectomy    . Appendectomy    . Cyst removal on back    . Cervical spine surgery    . Knee surgery Left   . Abdominal hysterectomy    . Cataract extraction w/ intraocular lens implant Bilateral   . Ganglion cyst excision Bilateral     There were no vitals filed for this visit.  Visit Diagnosis:  Right-sided low back pain without sciatica      Subjective Assessment - 10/02/15 1450    Subjective I'm pretty tired today. Hoping the exercises give me energy.    Currently in Pain? No/denies   Multiple Pain Sites No                          [facility] Adult PT Treatment/Exercise - 10/02/15 0001    Lumbar Exercises: Stretches   Active Hamstring Stretch 3 reps;20 seconds  seated and with strap in supine   Single Knee to Chest Stretch 3 reps;20 seconds   Lower Trunk Rotation 5 reps;10 seconds   Lumbar Exercises: Aerobic   UBE (Upper Arm Bike) Sitting on green ball Lo 2x2  VC for posture and core contraction   Lumbar Exercises: Supine   Clam 10 reps  Bil   Bridge 10 reps   Lumbar Exercises: Sidelying   Clam 10 reps  each side   Clam Limitations tactile cues to stabilize pelvis   Knee/Hip Exercises: Aerobic   Nustep L2 x 8 min   Knee/Hip Exercises: Standing   Rebounder wt shifting 1 min 3 ways                  PT Short Term Goals - 09/25/15 1517    PT SHORT TERM GOAL #1   Title understand correct body mechanics with daily tasks   Time 3   Period Weeks   Status On-going           PT Long Term Goals - 10/02/15 1452    PT  LONG TERM GOAL #1   Title independent with HEP and understand how to progress herselt   Time 6   Period Weeks   Status On-going   PT LONG TERM GOAL #4   Title FOTO score </= 54% limitaiton   Time 6   Period Weeks   Status On-going  will do on next visit               Plan - 10/02/15 1519    Clinical Impression Statement Pt came into session fatigued, no energy, somewhat stiff. After exercising: flexibility exs/hip strength/ aerobic pt felt energetic, less sleepy and like she could move better. FOTO will be done on next visit.    Pt will benefit from skilled therapeutic intervention in order to improve on the following deficits Pain;Decreased strength;Decreased mobility;Decreased balance;Improper body mechanics;Decreased endurance;Increased muscle spasms;Decreased range of motion   Rehab Potential Good   Clinical Impairments Affecting Rehab Potential None   PT Frequency 2x / week   PT Duration 6 weeks   PT Treatment/Interventions  ADLs/Self Care Home Management;Moist Heat;Electrical Stimulation;Therapeutic exercise;Therapeutic activities;Ultrasound;Neuromuscular re-education;Patient/family education;Manual techniques;Passive range of motion   PT Next Visit Plan FOTO   Consulted and Agree with Plan of Care Patient        Problem List Patient Active Problem List   Diagnosis Date Noted  . Neck pain 08/15/2015  . Abdominal pain, chronic, right lower quadrant 05/08/2015  . History of colonic polyps 05/08/2015  . Varicose veins of leg with complications 12/75/1700  . Chronic venous insufficiency 04/28/2015  . Abdominal pain, unspecified site 08/29/2014  . Family history of colon cancer 08/29/2014  . Rectal bleeding 08/29/2014  . Unspecified constipation 08/29/2014  . Chest pain, non-cardiac 08/29/2014  . Postthrombotic syndrome 06/23/2012  . Hemorrhage of rectum and anus 06/23/2012  . Other and unspecified coagulation defects 06/23/2012  . DVT, lower extremity ([place]) 06/21/2012  . Hyponatremia 06/21/2012  . Internal hemorrhoids 07/30/2011  . Family history of malignant neoplasm of gastrointestinal tract 07/30/2011  . Chest pain, unspecified 05/02/2011  . CAD, NATIVE VESSEL 03/15/2010  . OTHER CONSTIPATION 01/22/2010  . HYPERLIPIDEMIA-MIXED 04/08/2009  . HYPOTENSION, ORTHOSTATIC 04/08/2009  . HYPERTENSION 08/12/2007  . ALLERGIC RHINITIS 08/12/2007  . OSTEOARTHRITIS 08/12/2007    Tyrena Gohr, PTA 10/02/2015, 3:21 PM  [facility] [address], [place], [place], [address] Phone: [phone]   Fax:  [phone]

## 2015-10-04 ENCOUNTER — Ambulatory Visit: Payer: Medicare Other

## 2015-10-04 DIAGNOSIS — M545 Low back pain, unspecified: Secondary | ICD-10-CM

## 2015-10-04 DIAGNOSIS — B349 Viral infection, unspecified: Secondary | ICD-10-CM | POA: Diagnosis not present

## 2015-10-04 NOTE — Therapy (Signed)
[facility] [address] [place], [place], [address] Phone: [phone]   Fax:  [phone]  Physical Therapy Treatment  Patient Details  Name: [name] [name] MRN: [id number] Date of Birth: Sep 06, 1933 Referring Provider:  Earnie Larsson, MD  Encounter Date: 10/04/2015      PT End of Session - 10/04/15 1438    Visit Number 10   Number of Visits 20   Date for PT Re-Evaluation 10/11/15   PT Start Time 1400   PT Stop Time 1443   PT Time Calculation (min) 43 min   Activity Tolerance Patient tolerated treatment well   Behavior During Therapy [facility] for tasks assessed/performed      Past Medical History  Diagnosis Date  . Coronary artery disease   . Hyperlipidemia   . Orthostatic hypotension   . Hypertension   . Osteoarthrosis, unspecified whether generalized or localized, unspecified site   . Family history of malignant neoplasm of gastrointestinal tract   . Allergic rhinitis, cause unspecified   . Unspecified functional disorder of intestine   . H/O: hysterectomy   . Osteoporosis   . Anxiety and depression   . GERD (gastroesophageal reflux disease)   . Hypothyroidism   . Chronic headaches   . DJD (degenerative joint disease)     Past Surgical History  Procedure Laterality Date  . Cardiac catheterization    . Total knee arthroplasty Right   . Nasal sinus surgery    . Tonsillectomy    . Appendectomy    . Cyst removal on back    . Cervical spine surgery    . Knee surgery Left   . Abdominal hysterectomy    . Cataract extraction w/ intraocular lens implant Bilateral   . Ganglion cyst excision Bilateral     There were no vitals filed for this visit.  Visit Diagnosis:  Right-sided low back pain without sciatica      Subjective Assessment - 10/04/15 1406    Subjective 70% overall improvement in symptoms since the start of care.     Currently in Pain? 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, lumbar regions   Onset Date/Surgical Date 02/28/15   Precautions   Precautions Other (comment)   Precaution Comments No joint mobilization or foward flexion    Prior Function   Level of Independence Independent   Cognition   Overall Cognitive Status Within Functional Limits for tasks assessed   Observation/Other Assessments   Focus on Therapeutic Outcomes (FOTO)  42% limitation                     [facility] Adult PT Treatment/Exercise - 10/04/15 0001    Lumbar Exercises: Stretches   Active Hamstring Stretch 3 reps;20 seconds  seated and with strap in supine   Single Knee to Chest Stretch 3 reps;20 seconds   Single Knee to Chest Stretch Limitations diagonal knee to chest 3x20 seconds   Lower Trunk Rotation 5 reps;10 seconds   Lumbar Exercises: Aerobic   Stationary Bike Nu step L2 x 8 min  seat #7, arms #8   UBE (Upper Arm Bike) seated on chair Level 1x 3  had to stop due to getting hot with this activity                  PT Short Term Goals - 09/25/15 1517  PT SHORT TERM GOAL #1   Title understand correct body mechanics with daily tasks   Time 3   Period Weeks   Status On-going           PT Long Term Goals - 2015/10/05 1407    PT LONG TERM GOAL #1   Title independent with HEP and understand how to progress herselt   Time 6   Period Weeks   Status On-going   PT LONG TERM GOAL #2   Title pain with standing decreased >/= 40%   Status Achieved   PT LONG TERM GOAL #3   Title pain with walking decreased >/= 40%   Status Achieved   PT LONG TERM GOAL #4   Title FOTO score </= 54% limitaiton   Status Achieved               Plan - 10-05-15 1409    Clinical Impression Statement Pt reports 70% overall improvement since the start of care.  Earlier this week she reported Rt hip pain and the pain is inconsistent at times.  Pt would like to do 1 more week of PT to detmine if  pain will remain low or gone.  FOTO score is 42% limitation.  Pt will benefit from skilled PT for strength and flexibility, advancement of HEP and pain management as needed.     Pt will benefit from skilled therapeutic intervention in order to improve on the following deficits Pain;Decreased strength;Decreased mobility;Decreased balance;Improper body mechanics;Decreased endurance;Increased muscle spasms;Decreased range of motion   Rehab Potential Good   PT Frequency 2x / week   PT Duration 6 weeks   PT Treatment/Interventions ADLs/Self Care Home Management;Moist Heat;Electrical Stimulation;Therapeutic exercise;Therapeutic activities;Ultrasound;Neuromuscular re-education;Patient/family education;Manual techniques;Passive range of motion   PT Next Visit Plan 1 more week probable.     Consulted and Agree with Plan of Care Patient          G-Codes - Oct 05, 2015 1407    Functional Assessment Tool Used FOTO: 42% limitation   Functional Limitation Other PT primary   Other PT Primary Current Status (F0932) At least 40 percent but less than 60 percent impaired, limited or restricted   Other PT Primary Goal Status (T5573) At least 40 percent but less than 60 percent impaired, limited or restricted      Problem List Patient Active Problem List   Diagnosis Date Noted  . Neck pain 08/15/2015  . Abdominal pain, chronic, right lower quadrant 05/08/2015  . History of colonic polyps 05/08/2015  . Varicose veins of leg with complications 22/01/5426  . Chronic venous insufficiency 04/28/2015  . Abdominal pain, unspecified site 08/29/2014  . Family history of colon cancer 08/29/2014  . Rectal bleeding 08/29/2014  . Unspecified constipation 08/29/2014  . Chest pain, non-cardiac 08/29/2014  . Postthrombotic syndrome 06/23/2012  . Hemorrhage of rectum and anus 06/23/2012  . Other and unspecified coagulation defects 06/23/2012  . DVT, lower extremity ([place]) 06/21/2012  . Hyponatremia 06/21/2012  .  Internal hemorrhoids 07/30/2011  . Family history of malignant neoplasm of gastrointestinal tract 07/30/2011  . Chest pain, unspecified 05/02/2011  . CAD, NATIVE VESSEL 03/15/2010  . OTHER CONSTIPATION 01/22/2010  . HYPERLIPIDEMIA-MIXED 04/08/2009  . HYPOTENSION, ORTHOSTATIC 04/08/2009  . HYPERTENSION 08/12/2007  . ALLERGIC RHINITIS 08/12/2007  . OSTEOARTHRITIS 08/12/2007   Physical Therapy Progress Note  Dates of Reporting Period: 08/30/15 to 10-05-2015  Objective Reports of Subjective Statement: 70% overall improvement since the start of care.  Objective Measurements: FOTO: 42% limitation.  Goal Update: See above  Plan: 1 more week to finalize HEP and manage pain if it occurs.    Reason Skilled Services are Required: PT will continue 1 more week to finalize and advance HEP and to follow pt to ensure that pain doesn't increase or return.    TAKACS,KELLY, PT 10/04/2015, 2:43 PM  [facility] [address] [place], [place], [address] Phone: [phone]   Fax:  [phone]

## 2015-10-09 ENCOUNTER — Encounter: Payer: Self-pay | Admitting: Physical Therapy

## 2015-10-09 ENCOUNTER — Ambulatory Visit: Payer: Medicare Other | Admitting: Physical Therapy

## 2015-10-09 DIAGNOSIS — M545 Low back pain, unspecified: Secondary | ICD-10-CM

## 2015-10-09 NOTE — Patient Instructions (Signed)
Abduction: Clam (Eccentric) - Side-Lying   Lie on side with knees bent. Lift top knee, keeping feet together. Keep trunk steady. Slowly lower for 3-5 seconds.  10 reps per set each side. Repeat 2-3 x per set. 2-3 x per day  HIP: Abduction / External Rotation (Band)   Place band around knees. Lie on side with hips and knees bent. Raise top knee up, Keep feet together. Hold 3-5 seconds. Use red  band. 10  to 30 reps per set,  2-3 sets per day,   Bridge   Lie back, legs bent. Inhale, pressing hips up. Keeping ribs in, lengthen lower back. Exhale, rolling down along spine from top. Repeat 10 -30  times. Do 2-3  sessions per day.  http://[email or web address].exer.us/358   EXTENSION: Prone - Knee Flexed (Active)   Lie on stomach, right knee bent to 90. Lift leg toward ceiling. Use  2# lbs. Complete 10 sets of ___ repetitions. Perform 2  sessions per day.  http://gtsc.exer.us/66   Hip Extension (Prone)   Lift left leg _3___ inches from floor, keeping knee locked. Repeat 10- 20____ times per set. Do 2-3  sessions per day.  HIP: Abduction - Side Step (Band)   Place band around ankles. Alternating legs: step out, step out, step in, step in. Repeat, stepping out first with other leg. Hold _5__ seconds. Use _red t-band. _10 -20__ reps per set, _2-3__ sets per day, Hold onto a support.  Copyright  VHI. All rights reserved.

## 2015-10-09 NOTE — Therapy (Signed)
[facility] [address] [place], [place], [address] Phone: [phone]   Fax:  [phone]  Physical Therapy Treatment  Patient Details  Name: [name] [name] MRN: [id number] Date of Birth: 1933-01-08 Referring Provider:  Earnie Larsson, MD  Encounter Date: 10/09/2015      PT End of Session - 10/09/15 1451    Visit Number 11   Number of Visits 20   PT Start Time 2831   PT Stop Time 5176   PT Time Calculation (min) 45 min   Activity Tolerance Patient tolerated treatment well   Behavior During Therapy [facility] for tasks assessed/performed      Past Medical History  Diagnosis Date  . Coronary artery disease   . Hyperlipidemia   . Orthostatic hypotension   . Hypertension   . Osteoarthrosis, unspecified whether generalized or localized, unspecified site   . Family history of malignant neoplasm of gastrointestinal tract   . Allergic rhinitis, cause unspecified   . Unspecified functional disorder of intestine   . H/O: hysterectomy   . Osteoporosis   . Anxiety and depression   . GERD (gastroesophageal reflux disease)   . Hypothyroidism   . Chronic headaches   . DJD (degenerative joint disease)     Past Surgical History  Procedure Laterality Date  . Cardiac catheterization    . Total knee arthroplasty Right   . Nasal sinus surgery    . Tonsillectomy    . Appendectomy    . Cyst removal on back    . Cervical spine surgery    . Knee surgery Left   . Abdominal hysterectomy    . Cataract extraction w/ intraocular lens implant Bilateral   . Ganglion cyst excision Bilateral     There were no vitals filed for this visit.  Visit Diagnosis:  Right-sided low back pain without sciatica      Subjective Assessment - 10/09/15 1449    Subjective Pt feels better and reports compliance with HEP   Currently in Pain? No/denies                         [facility] Adult PT Treatment/Exercise - 10/09/15 0001     Lumbar Exercises: Stretches   Active Hamstring Stretch 3 reps;20 seconds  in supine using strap, each leg   Single Knee to Chest Stretch 3 reps;20 seconds   Lower Trunk Rotation 3 reps;20 seconds   Piriformis Stretch 3 reps;20 seconds  each leg   Lumbar Exercises: Aerobic   UBE (Upper Arm Bike) seated on chair Level 1x 7 min (3.5/3.5)   Lumbar Exercises: Supine   Clam 10 reps   Clam Limitations --  each side   Lumbar Exercises: Sidelying   Clam 10 reps   Clam Limitations tactile cues to stabilize pelvis   Lumbar Exercises: Prone   Other Prone Lumbar Exercises unilat leg lift with bend knee and extented leg x 10 each side                PT Education - 10/09/15 1512    Education Details Clam., hip extension with bend and extented knee, standing hip abd   Person(s) Educated Patient   Methods Explanation;Demonstration;Handout   Comprehension Verbalized understanding;Returned demonstration          PT Short Term Goals - 10/09/15 1458    PT SHORT TERM GOAL #1   Title understand correct body mechanics with daily tasks   Time 3  Period Weeks   Status Achieved           PT Long Term Goals - 10/09/15 1459    PT LONG TERM GOAL #1   Title independent with HEP and understand how to progress herselt   Time 6   Period Weeks   Status On-going   PT LONG TERM GOAL #2   Title pain with standing decreased >/= 40%   Time 6   Period Weeks   Status Achieved   PT LONG TERM GOAL #3   Title pain with walking decreased >/= 40%   Time 6   Period Weeks   Status Achieved   PT LONG TERM GOAL #4   Title FOTO score </= 54% limitaiton   Time 6   Period Weeks   Status Achieved               Plan - 10/09/15 1452    Clinical Impression Statement Pt reports she will recieve a shot for her back pain on the 13th of October. Pt. is ready for D/c next visit.   Pt will benefit from skilled therapeutic intervention in order to improve on the following deficits Pain;Decreased  strength;Decreased mobility;Decreased balance;Improper body mechanics;Decreased endurance;Increased muscle spasms;Decreased range of motion   Rehab Potential Good   PT Frequency 2x / week   PT Duration 6 weeks   PT Treatment/Interventions ADLs/Self Care Home Management;Moist Heat;Electrical Stimulation;Therapeutic exercise;Therapeutic activities;Ultrasound;Neuromuscular re-education;Patient/family education;Manual techniques;Passive range of motion   PT Next Visit Plan D/C next visit   Consulted and Agree with Plan of Care Patient        Problem List Patient Active Problem List   Diagnosis Date Noted  . Neck pain 08/15/2015  . Abdominal pain, chronic, right lower quadrant 05/08/2015  . History of colonic polyps 05/08/2015  . Varicose veins of leg with complications 40/81/4481  . Chronic venous insufficiency 04/28/2015  . Abdominal pain, unspecified site 08/29/2014  . Family history of colon cancer 08/29/2014  . Rectal bleeding 08/29/2014  . Unspecified constipation 08/29/2014  . Chest pain, non-cardiac 08/29/2014  . Postthrombotic syndrome 06/23/2012  . Hemorrhage of rectum and anus 06/23/2012  . Other and unspecified coagulation defects 06/23/2012  . DVT, lower extremity ([place]) 06/21/2012  . Hyponatremia 06/21/2012  . Internal hemorrhoids 07/30/2011  . Family history of malignant neoplasm of gastrointestinal tract 07/30/2011  . Chest pain, unspecified 05/02/2011  . CAD, NATIVE VESSEL 03/15/2010  . OTHER CONSTIPATION 01/22/2010  . HYPERLIPIDEMIA-MIXED 04/08/2009  . HYPOTENSION, ORTHOSTATIC 04/08/2009  . HYPERTENSION 08/12/2007  . ALLERGIC RHINITIS 08/12/2007  . OSTEOARTHRITIS 08/12/2007    NAUMANN-HOUEGNIFIO,Jaria Conway PTA 10/09/2015, 3:36 PM  [facility] [address], [place], [place], [address] Phone: [phone]   Fax:  [phone]

## 2015-10-11 ENCOUNTER — Ambulatory Visit: Payer: Medicare Other

## 2015-10-11 DIAGNOSIS — M545 Low back pain, unspecified: Secondary | ICD-10-CM

## 2015-10-11 NOTE — Therapy (Addendum)
[facility] [address], [place], [place], [address] Phone: [phone]   Fax:  [phone]  Physical Therapy Treatment  Patient Details  Name: [name] [name] MRN: [id number] Date of Birth: April 05, 1933 Referring Provider:  Earnie Larsson, MD  Encounter Date: 10/11/2015      PT End of Session - 10/11/15 1427    Visit Number 12   PT Start Time 5726   PT Stop Time 1436   PT Time Calculation (min) 32 min   Activity Tolerance Patient tolerated treatment well   Behavior During Therapy [facility] for tasks assessed/performed      Past Medical History  Diagnosis Date  . Coronary artery disease   . Hyperlipidemia   . Orthostatic hypotension   . Hypertension   . Osteoarthrosis, unspecified whether generalized or localized, unspecified site   . Family history of malignant neoplasm of gastrointestinal tract   . Allergic rhinitis, cause unspecified   . Unspecified functional disorder of intestine   . H/O: hysterectomy   . Osteoporosis   . Anxiety and depression   . GERD (gastroesophageal reflux disease)   . Hypothyroidism   . Chronic headaches   . DJD (degenerative joint disease)     Past Surgical History  Procedure Laterality Date  . Cardiac catheterization    . Total knee arthroplasty Right   . Nasal sinus surgery    . Tonsillectomy    . Appendectomy    . Cyst removal on back    . Cervical spine surgery    . Knee surgery Left   . Abdominal hysterectomy    . Cataract extraction w/ intraocular lens implant Bilateral   . Ganglion cyst excision Bilateral     There were no vitals filed for this visit.  Visit Diagnosis:  Right-sided low back pain without sciatica      Subjective Assessment - 10/11/15 1407    Subjective Pt is ready for D/C.  No pain today.  Will get injection tomorrow.     Currently in Pain? No/denies            [facility] PT Assessment - 10/11/15 0001    Assessment   Medical Diagnosis M43.16  spondylosistesis, lumbar regions   Onset Date/Surgical Date 02/28/15   Precautions   Precautions Other (comment)   Precaution Comments No joint mobilization or foward flexion    Prior Function   Level of Independence Independent   Observation/Other Assessments   Focus on Therapeutic Outcomes (FOTO)  42% limitation                     [facility] Adult PT Treatment/Exercise - 10/11/15 0001    Lumbar Exercises: Stretches   Active Hamstring Stretch 3 reps;20 seconds  in supine using strap, each leg   Single Knee to Chest Stretch 3 reps;20 seconds   Lower Trunk Rotation 3 reps;20 seconds   Piriformis Stretch 3 reps;20 seconds  each leg   Lumbar Exercises: Aerobic   Stationary Bike Nu step L2 x 8 min  seat #7, arms #8   UBE (Upper Arm Bike) seated on chair Level 1x 7 min (3.5/3.5)   Lumbar Exercises: Sidelying   Clam 10 reps   Clam Limitations tactile cues to stabilize pelvis                  PT Short Term Goals - 10/09/15 1458    PT SHORT TERM GOAL #1   Title understand correct body mechanics with daily  tasks   Time 3   Period Weeks   Status Achieved           PT Long Term Goals - 11/10/15 1408    PT LONG TERM GOAL #1   Title independent with HEP and understand how to progress herselt   Status Achieved   PT LONG TERM GOAL #2   Title pain with standing decreased >/= 40%   Status Achieved   PT LONG TERM GOAL #3   Title pain with walking decreased >/= 40%   Status Achieved   PT LONG TERM GOAL #4   Title FOTO score </= 54% limitaiton    Achieved.            Plan - 11/10/15 1409    Clinical Impression Statement Pt reports 75-80% overall in Rt low back symptoms since the start of care.  Pt has met all goals and FOTO score is 42% limitation.  Pt will be discharged to HEP for further expected gains.     PT Next Visit Plan D/C PT to HEP   Consulted and Agree with Plan of Care Patient          G-Codes - 10-Nov-2015 1409    Functional  Assessment Tool Used FOTO: 42% limitation   Functional Limitation Other PT primary   Other PT Primary Goal Status (S3419) At least 40 percent but less than 60 percent impaired, limited or restricted   Other PT Primary Discharge Status (Q2229) At least 40 percent but less than 60 percent impaired, limited or restricted      Problem List Patient Active Problem List   Diagnosis Date Noted  . Neck pain 08/15/2015  . Abdominal pain, chronic, right lower quadrant 05/08/2015  . History of colonic polyps 05/08/2015  . Varicose veins of leg with complications 79/89/2119  . Chronic venous insufficiency 04/28/2015  . Abdominal pain, unspecified site 08/29/2014  . Family history of colon cancer 08/29/2014  . Rectal bleeding 08/29/2014  . Unspecified constipation 08/29/2014  . Chest pain, non-cardiac 08/29/2014  . Postthrombotic syndrome 06/23/2012  . Hemorrhage of rectum and anus 06/23/2012  . Other and unspecified coagulation defects 06/23/2012  . DVT, lower extremity ([place]) 06/21/2012  . Hyponatremia 06/21/2012  . Internal hemorrhoids 07/30/2011  . Family history of malignant neoplasm of gastrointestinal tract 07/30/2011  . Chest pain, unspecified 05/02/2011  . CAD, NATIVE VESSEL 03/15/2010  . OTHER CONSTIPATION 01/22/2010  . HYPERLIPIDEMIA-MIXED 04/08/2009  . HYPOTENSION, ORTHOSTATIC 04/08/2009  . HYPERTENSION 08/12/2007  . ALLERGIC RHINITIS 08/12/2007  . OSTEOARTHRITIS 08/12/2007  PHYSICAL THERAPY DISCHARGE SUMMARY  Visits from Start of Care: 12  Current functional level related to goals / functional outcomes:See above for goal status.   Remaining deficits: Pt reports no significant deficits at this time.    Education / Equipment: HEP, posture/body mechanics Plan: Patient agrees to discharge.  Patient goals were met. Patient is being discharged due to meeting the stated rehab goals.  ?????      TAKACS,KELLY, PT Nov 10, 2015, 2:27 PM  [facility] [address] [place], [place], [address] Phone: [phone]   Fax:  [phone]

## 2015-10-12 DIAGNOSIS — M4316 Spondylolisthesis, lumbar region: Secondary | ICD-10-CM | POA: Diagnosis not present

## 2015-10-12 DIAGNOSIS — M5416 Radiculopathy, lumbar region: Secondary | ICD-10-CM | POA: Diagnosis not present

## 2015-10-16 ENCOUNTER — Encounter: Payer: Medicare Other | Admitting: Physical Therapy

## 2015-10-18 ENCOUNTER — Encounter: Payer: Medicare Other | Admitting: Physical Therapy

## 2015-11-07 DIAGNOSIS — Z6828 Body mass index (BMI) 28.0-28.9, adult: Secondary | ICD-10-CM | POA: Diagnosis not present

## 2015-11-07 DIAGNOSIS — M5416 Radiculopathy, lumbar region: Secondary | ICD-10-CM | POA: Diagnosis not present

## 2015-11-07 DIAGNOSIS — M25551 Pain in right hip: Secondary | ICD-10-CM | POA: Diagnosis not present

## 2015-11-07 DIAGNOSIS — I10 Essential (primary) hypertension: Secondary | ICD-10-CM | POA: Diagnosis not present

## 2015-11-27 DIAGNOSIS — E039 Hypothyroidism, unspecified: Secondary | ICD-10-CM | POA: Diagnosis not present

## 2015-11-27 DIAGNOSIS — R197 Diarrhea, unspecified: Secondary | ICD-10-CM | POA: Diagnosis not present

## 2015-11-27 DIAGNOSIS — B001 Herpesviral vesicular dermatitis: Secondary | ICD-10-CM | POA: Diagnosis not present

## 2015-11-27 DIAGNOSIS — Z23 Encounter for immunization: Secondary | ICD-10-CM | POA: Diagnosis not present

## 2015-12-08 ENCOUNTER — Encounter: Payer: Self-pay | Admitting: Internal Medicine

## 2015-12-08 ENCOUNTER — Ambulatory Visit (INDEPENDENT_AMBULATORY_CARE_PROVIDER_SITE_OTHER): Payer: Medicare Other | Admitting: Internal Medicine

## 2015-12-08 VITALS — BP 122/76 | HR 75 | Ht 65.0 in | Wt 173.8 lb

## 2015-12-08 DIAGNOSIS — R002 Palpitations: Secondary | ICD-10-CM | POA: Diagnosis not present

## 2015-12-08 DIAGNOSIS — I2510 Atherosclerotic heart disease of native coronary artery without angina pectoris: Secondary | ICD-10-CM | POA: Diagnosis not present

## 2015-12-08 DIAGNOSIS — I38 Endocarditis, valve unspecified: Secondary | ICD-10-CM | POA: Diagnosis not present

## 2015-12-08 DIAGNOSIS — R0602 Shortness of breath: Secondary | ICD-10-CM | POA: Diagnosis not present

## 2015-12-08 DIAGNOSIS — M549 Dorsalgia, unspecified: Secondary | ICD-10-CM | POA: Diagnosis not present

## 2015-12-08 DIAGNOSIS — I6523 Occlusion and stenosis of bilateral carotid arteries: Secondary | ICD-10-CM | POA: Diagnosis not present

## 2015-12-08 DIAGNOSIS — I10 Essential (primary) hypertension: Secondary | ICD-10-CM

## 2015-12-08 DIAGNOSIS — R079 Chest pain, unspecified: Secondary | ICD-10-CM | POA: Diagnosis not present

## 2015-12-08 DIAGNOSIS — E785 Hyperlipidemia, unspecified: Secondary | ICD-10-CM | POA: Diagnosis not present

## 2015-12-08 DIAGNOSIS — I4891 Unspecified atrial fibrillation: Secondary | ICD-10-CM

## 2015-12-08 MED ORDER — APIXABAN 5 MG PO TABS: 5.0000 mg | ORAL_TABLET | Freq: Two times a day (BID) | ORAL | Status: DC

## 2015-12-08 NOTE — Patient Instructions (Addendum)
Your physician has recommended you make the following change in your medication:  1.) Eliquis 5 mg twice a day  Your physician has requested that you have an echocardiogram. Echocardiography is a painless test that uses sound waves to create images of your heart. It provides your doctor with information about the size and shape of your heart and how well your heart's chambers and valves are working. This procedure takes approximately one hour. There are no restrictions for this procedure.  Your physician has requested that you have a lexiscan myoview. For further information please visit [email or web address]. Please follow instruction sheet, as given.  Your physician recommends that you return for lab work in: today (BNP)  Your physician recommends that you schedule a follow-up appointment in: 4 weeks with Dr. Harrington Challenger. (please use morning end spot on Jan 12.

## 2015-12-08 NOTE — Progress Notes (Signed)
Cardiology Office Note   Date:  12/08/2015   ID:  [name] [name], DOB [birth year], MRN [id number]  PCP:  Antony Blackbird, MD  Cardiologist:   Dorris Carnes, MD   Pt referred for atrial fib     History of Present Illness: [name] [name] is a 81 y.o. female with a history of HTN and CAD  She has been seen by T Wall in the past   Pt referred for atrial fib This is new   Pt says sometimes laying in bed will feel something not right at night  Will get tired  Cant walk far  Gets out of breath  This is  New   Coughing a lot   She has slowed down with activities   Has had a 'needle" pain in chest  Occur at night  Lasts Seconds    Face fuller   Has varicose veins in legs  Sl swelling   Hemorrhoids bleed occasionally    Current Outpatient Prescriptions  Medication Sig Dispense Refill  . acetaminophen (TYLENOL) 500 MG tablet Take 500 mg by mouth every 6 (six) hours as needed.    Marland Kitchen ascorbic acid (VITAMIN C) 1000 MG tablet Take 1,000 mg by mouth daily.    Marland Kitchen atenolol (TENORMIN) 50 MG tablet Take 50 mg by mouth 2 (two) times daily.     Marland Kitchen atorvastatin (LIPITOR) 20 MG tablet Take 10 mg by mouth daily.     . cyanocobalamin 2000 MCG tablet Take 2,000 mcg by mouth daily.    . DULoxetine (CYMBALTA) 60 MG capsule Take 60 mg by mouth daily.    . ergocalciferol (VITAMIN D2) 50000 UNITS capsule Take 50,000 Units by mouth once a week.    . esomeprazole (NEXIUM) 40 MG capsule Take 40 mg by mouth daily.     . furosemide (LASIX) 20 MG tablet Take 20 mg by mouth daily.    Marland Kitchen levothyroxine (SYNTHROID, LEVOTHROID) 112 MCG tablet Take 112 mcg by mouth daily before breakfast.    . nitroGLYCERIN (NITROSTAT) 0.4 MG SL tablet Place 1 tablet (0.4 mg total) under the tongue every 5 (five) minutes as needed for chest pain. 25 tablet 3  . potassium chloride (K-DUR) 10 MEQ tablet Take 10 mEq by mouth daily.    . psyllium (REGULOID) 0.52 G capsule Take 0.52 g by mouth 2 (two) times daily.    Marland Kitchen tiZANidine (ZANAFLEX) 2 MG tablet  TAKE (1) TABLET EVERY EIGHT HOURS AS NEEDED FOR NECK PAIN 30 tablet 0  . valACYclovir (VALTREX) 1000 MG tablet Take 1,000 mg by mouth as needed ([place]).   3  . valsartan (DIOVAN) 80 MG tablet Take 80 mg by mouth 2 (two) times daily.    . verapamil (COVERA HS) 180 MG (CO) 24 hr tablet Take 180 mg by mouth at bedtime.     No current facility-administered medications for this visit.    Allergies:   Amoxicillin; Aspirin; Percocet; Prednisone; and Sulfonamide derivatives   Past Medical History  Diagnosis Date  . Coronary artery disease   . Hyperlipidemia   . Orthostatic hypotension   . Hypertension   . Osteoarthrosis, unspecified whether generalized or localized, unspecified site   . Family history of malignant neoplasm of gastrointestinal tract   . Allergic rhinitis, cause unspecified   . Unspecified functional disorder of intestine   . H/O: hysterectomy   . Osteoporosis   . Anxiety and depression   . GERD (gastroesophageal reflux disease)   . Hypothyroidism   .  Chronic headaches   . DJD (degenerative joint disease)     Past Surgical History  Procedure Laterality Date  . Cardiac catheterization    . Total knee arthroplasty Right   . Nasal sinus surgery    . Tonsillectomy    . Appendectomy    . Cyst removal on back    . Cervical spine surgery    . Knee surgery Left   . Abdominal hysterectomy    . Cataract extraction w/ intraocular lens implant Bilateral   . Ganglion cyst excision Bilateral      Social History:  The patient  reports that she has never smoked. She has never used smokeless tobacco. She reports that she does not drink alcohol or use illicit drugs.   Family History:  The patient's family history includes Brain cancer in her other; Cancer in her mother; Colon cancer in her mother; Heart attack (age of onset: 4) in her father; Heart disease in her father, maternal aunt, and maternal uncle; Varicose Veins in her mother. There is no history of Pancreatic cancer,  Prostate cancer, Stomach cancer, Liver disease, or Kidney disease.    ROS:  Please see the history of present illness. All other systems are reviewed and  Negative to the above problem except as noted.    PHYSICAL EXAM: VS:  BP 122/76 mmHg  Pulse 75  Ht 5\' 5"  (1.651 m)  Wt 78.835 kg (173 lb 12.8 oz)  BMI 28.92 kg/m2  SpO2 97%  GEN: Well nourished, well developed, in no acute distress HEENT: normal Neck: no JVD, carotid bruits, or masses Cardiac: RRR; no murmurs, rubs, or gallops,no edema  Respiratory:  clear to auscultation bilaterally, normal work of breathing GI: soft, nontender, nondistended, + BS  No hepatomegaly  MS: no deformity Moving all extremities   Skin: warm and dry, no rash Neuro:  Strength and sensation are intact Psych: euthymic mood, full affect   EKG:  EKG is ordered today.  Atrial fib  75 bpm  Nonspecific ST T wave chnages     Lipid Panel    Component Value Date/Time   CHOL 148 03/07/2014 1105   TRIG 58.0 03/07/2014 1105   TRIG 128 12/26/2006 1227   HDL 62.20 03/07/2014 1105   CHOLHDL 2 03/07/2014 1105   CHOLHDL 3.3 CALC 12/26/2006 1227   VLDL 11.6 03/07/2014 1105   LDLCALC 74 03/07/2014 1105      Wt Readings from Last 3 Encounters:  12/08/15 78.835 kg (173 lb 12.8 oz)  08/15/15 77.338 kg (170 lb 8 oz)  08/01/15 75.978 kg (167 lb 8 oz)      ASSESSMENT AND PLAN:  1  Afib  Duraion unknown  CHADSVASc 5   Would get echo   Start Eliquis.  Continue rate control    2  CAD  Symptoms of dyspnea may represent afib  But need to exclude ischemia  Would set up for echo and lexiscan myoview    3.  HTn  BP is OK    4  HL  Continue satin    6  hypothyroidism Need to confirm TSH    F/U in 1 month   Will check BNP     Signed, Dorris Carnes, MD  12/08/2015 2:29 PM    [address] [place], [place], [place]  [address] Phone: [phone]; Fax: [phone]

## 2015-12-09 LAB — BRAIN NATRIURETIC PEPTIDE: Brain Natriuretic Peptide: 722.4 pg/mL — ABNORMAL HIGH (ref 0.0–100.0)

## 2015-12-11 ENCOUNTER — Encounter: Payer: Self-pay | Admitting: Internal Medicine

## 2015-12-11 ENCOUNTER — Telehealth: Payer: Self-pay | Admitting: *Deleted

## 2015-12-11 DIAGNOSIS — R0602 Shortness of breath: Secondary | ICD-10-CM

## 2015-12-11 NOTE — Telephone Encounter (Signed)
Informed patient. She verbalizes understanding. Will take 2 lasix pills every third day, one all other days. Will have lab work on day when she comes for echo on 12/28. She will watch salt intake.   She wanted Dr. Harrington Challenger to know that she saw Dr. Sherren Mocha Early for the veins in her legs, she thought this might be relevant information.

## 2015-12-11 NOTE — Telephone Encounter (Signed)
-----   Message from Dorris Carnes V, MD sent at 12/11/2015 12:18 AM EST ----- Fluid appears to be up a little bit  Would double up on lasix every 3 days   Watch salt F/U BMET and BNP in 2 wks

## 2015-12-13 ENCOUNTER — Other Ambulatory Visit: Payer: Self-pay | Admitting: Nurse Practitioner

## 2015-12-13 MED ORDER — POTASSIUM CHLORIDE ER 10 MEQ PO TBCR: 20.0000 meq | EXTENDED_RELEASE_TABLET | Freq: Every day | ORAL | Status: DC

## 2015-12-21 ENCOUNTER — Telehealth (HOSPITAL_COMMUNITY): Payer: Self-pay | Admitting: *Deleted

## 2015-12-21 NOTE — Telephone Encounter (Signed)
Left message on voicemail of cell phone due to home # constantly busy per DPR in reference to upcoming appointment scheduled on 12/27/15 at 0830 with detailed instructions given per Myocardial Perfusion Study Information Sheet for the test. LM to arrive 15 minutes early, and that it is imperative to arrive on time for appointment to keep from having the test rescheduled. If you need to cancel or reschedule your appointment, please call the office within 24 hours of your appointment. Failure to do so may result in a cancellation of your appointment, and a $50 no show fee. Phone number given for call back for any questions. Handy Mcloud, Ranae Palms

## 2015-12-27 ENCOUNTER — Ambulatory Visit (HOSPITAL_COMMUNITY): Payer: Medicare Other | Attending: Cardiovascular Disease

## 2015-12-27 ENCOUNTER — Ambulatory Visit (HOSPITAL_BASED_OUTPATIENT_CLINIC_OR_DEPARTMENT_OTHER): Payer: Medicare Other

## 2015-12-27 ENCOUNTER — Other Ambulatory Visit (INDEPENDENT_AMBULATORY_CARE_PROVIDER_SITE_OTHER): Payer: Medicare Other | Admitting: *Deleted

## 2015-12-27 ENCOUNTER — Other Ambulatory Visit: Payer: Self-pay

## 2015-12-27 DIAGNOSIS — R0602 Shortness of breath: Secondary | ICD-10-CM | POA: Insufficient documentation

## 2015-12-27 DIAGNOSIS — I10 Essential (primary) hypertension: Secondary | ICD-10-CM

## 2015-12-27 DIAGNOSIS — I4891 Unspecified atrial fibrillation: Secondary | ICD-10-CM

## 2015-12-27 DIAGNOSIS — R0609 Other forms of dyspnea: Secondary | ICD-10-CM | POA: Diagnosis not present

## 2015-12-27 DIAGNOSIS — I517 Cardiomegaly: Secondary | ICD-10-CM | POA: Diagnosis not present

## 2015-12-27 DIAGNOSIS — I313 Pericardial effusion (noninflammatory): Secondary | ICD-10-CM | POA: Insufficient documentation

## 2015-12-27 DIAGNOSIS — Z8249 Family history of ischemic heart disease and other diseases of the circulatory system: Secondary | ICD-10-CM | POA: Diagnosis not present

## 2015-12-27 DIAGNOSIS — E785 Hyperlipidemia, unspecified: Secondary | ICD-10-CM | POA: Insufficient documentation

## 2015-12-27 DIAGNOSIS — I340 Nonrheumatic mitral (valve) insufficiency: Secondary | ICD-10-CM | POA: Insufficient documentation

## 2015-12-27 DIAGNOSIS — I071 Rheumatic tricuspid insufficiency: Secondary | ICD-10-CM | POA: Diagnosis not present

## 2015-12-27 DIAGNOSIS — R002 Palpitations: Secondary | ICD-10-CM | POA: Insufficient documentation

## 2015-12-27 DIAGNOSIS — I779 Disorder of arteries and arterioles, unspecified: Secondary | ICD-10-CM | POA: Insufficient documentation

## 2015-12-27 DIAGNOSIS — R079 Chest pain, unspecified: Secondary | ICD-10-CM | POA: Diagnosis not present

## 2015-12-27 LAB — MYOCARDIAL PERFUSION IMAGING
CHL CUP RESTING HR STRESS: 56 {beats}/min
LHR: 0.31
LVDIAVOL: 73 mL
LVSYSVOL: 21 mL
NUC STRESS TID: 1.1
Peak HR: 77 {beats}/min
SDS: 2
SRS: 0
SSS: 2

## 2015-12-27 LAB — BASIC METABOLIC PANEL
BUN: 19 mg/dL (ref 7–25)
CHLORIDE: 102 mmol/L (ref 98–110)
CO2: 21 mmol/L (ref 20–31)
Calcium: 9.1 mg/dL (ref 8.6–10.4)
Creat: 0.94 mg/dL — ABNORMAL HIGH (ref 0.60–0.88)
Glucose, Bld: 81 mg/dL (ref 65–99)
POTASSIUM: 4 mmol/L (ref 3.5–5.3)
Sodium: 137 mmol/L (ref 135–146)

## 2015-12-27 LAB — BRAIN NATRIURETIC PEPTIDE: Brain Natriuretic Peptide: 149.8 pg/mL — ABNORMAL HIGH (ref 0.0–100.0)

## 2015-12-27 MED ORDER — REGADENOSON 0.4 MG/5ML IV SOLN
0.4000 mg | Freq: Once | INTRAVENOUS | Status: AC
  Administered 2015-12-27: 0.4 mg via INTRAVENOUS

## 2015-12-27 MED ORDER — TECHNETIUM TC 99M SESTAMIBI GENERIC - CARDIOLITE
10.7000 | Freq: Once | INTRAVENOUS | Status: AC | PRN
  Administered 2015-12-27: 11 via INTRAVENOUS

## 2015-12-27 MED ORDER — TECHNETIUM TC 99M SESTAMIBI GENERIC - CARDIOLITE
33.0000 | Freq: Once | INTRAVENOUS | Status: AC | PRN
  Administered 2015-12-27: 33 via INTRAVENOUS

## 2015-12-27 NOTE — Addendum Note (Signed)
Addended by: Eulis Foster on: 12/27/2015 07:49 AM   Modules accepted: Orders

## 2016-01-11 ENCOUNTER — Encounter: Payer: Self-pay | Admitting: Internal Medicine

## 2016-01-11 ENCOUNTER — Ambulatory Visit (INDEPENDENT_AMBULATORY_CARE_PROVIDER_SITE_OTHER): Payer: Medicare Other | Admitting: Internal Medicine

## 2016-01-11 VITALS — BP 148/80 | HR 70 | Ht 65.0 in | Wt 167.0 lb

## 2016-01-11 DIAGNOSIS — I4891 Unspecified atrial fibrillation: Secondary | ICD-10-CM | POA: Diagnosis not present

## 2016-01-11 NOTE — Patient Instructions (Signed)
Your physician recommends that you continue on your current medications as directed. Please refer to the Current Medication list given to you today. Your physician recommends that you schedule a follow-up appointment in: 2 months with Dr. Harrington Challenger.  If you are feeling sluggish, you can try taking 1/2 tablet of tenormin twice daily. Please follow your blood pressure closely, recording your readings and bring them with you to your next appointment. Marland Kitchen

## 2016-01-11 NOTE — Progress Notes (Signed)
Cardiology Office Note   Date:  01/11/2016   ID:  [name], DOB 02-26-1933, MRN [id number]  PCP:  Antony Blackbird, MD  Cardiologist:   Dorris Carnes, MD   F/U of PAF  History of Present Illness: [name] [name] is a 82 y.o. female with a history of HTN and CAD She has been seen by T Wall in the past  I saw her last month for new onset atrial fib    She has slowed down with activities  Has had a 'needle" pain in chest Occur at night Lasts Seconds  When I saw her I recomm she start anticoagulation She was also set up for an echo  This showed normal LV and RV function The R atrium was mildly dilated, L atrium moderately  There was severe TR noted. Myoview scan was normal  No ischemia  Since seen the patient says she does tire some with activity   Denies palpitations.         Current Outpatient Prescriptions  Medication Sig Dispense Refill  . acetaminophen (TYLENOL) 500 MG tablet Take 500 mg by mouth every 6 (six) hours as needed.    Marland Kitchen apixaban (ELIQUIS) 5 MG TABS tablet Take 1 tablet (5 mg total) by mouth 2 (two) times daily. 60 tablet 11  . ascorbic acid (VITAMIN C) 1000 MG tablet Take 1,000 mg by mouth daily.    Marland Kitchen atenolol (TENORMIN) 50 MG tablet Take 50 mg by mouth 2 (two) times daily.     Marland Kitchen atorvastatin (LIPITOR) 20 MG tablet Take 10 mg by mouth daily.     . DULoxetine (CYMBALTA) 60 MG capsule Take 60 mg by mouth daily.    . ergocalciferol (VITAMIN D2) 50000 UNITS capsule Take 50,000 Units by mouth once a week.    . esomeprazole (NEXIUM) 40 MG capsule Take 40 mg by mouth daily.     . furosemide (LASIX) 20 MG tablet Take 20 mg by mouth daily. One tablet every day except every 3rd day take two tablets.    Marland Kitchen levothyroxine (SYNTHROID, LEVOTHROID) 112 MCG tablet Take 112 mcg by mouth daily before breakfast.    . nitroGLYCERIN (NITROSTAT) 0.4 MG SL tablet Place 1 tablet (0.4 mg total) under the tongue every 5 (five) minutes as needed for chest pain. 25 tablet 3  .  potassium chloride (K-DUR) 10 MEQ tablet Take 2 tablets (20 mEq total) by mouth daily. 60 tablet 11  . psyllium (REGULOID) 0.52 G capsule Take 0.52 g by mouth 2 (two) times daily.    Marland Kitchen tiZANidine (ZANAFLEX) 2 MG tablet TAKE (1) TABLET EVERY EIGHT HOURS AS NEEDED FOR NECK PAIN 30 tablet 0  . valACYclovir (VALTREX) 1000 MG tablet Take 1,000 mg by mouth as needed ([place]).   3  . valsartan (DIOVAN) 80 MG tablet Take 80 mg by mouth 2 (two) times daily.    . verapamil (COVERA HS) 180 MG (CO) 24 hr tablet Take 180 mg by mouth at bedtime.     No current facility-administered medications for this visit.    Allergies:   Amoxicillin; Aspirin; Percocet; Prednisone; and Sulfonamide derivatives   Past Medical History  Diagnosis Date  . Coronary artery disease   . Hyperlipidemia   . Orthostatic hypotension   . Hypertension   . Osteoarthrosis, unspecified whether generalized or localized, unspecified site   . Family history of malignant neoplasm of gastrointestinal tract   . Allergic rhinitis, cause unspecified   . Unspecified functional disorder of intestine   .  H/O: hysterectomy   . Osteoporosis   . Anxiety and depression   . GERD (gastroesophageal reflux disease)   . Hypothyroidism   . Chronic headaches   . DJD (degenerative joint disease)     Past Surgical History  Procedure Laterality Date  . Cardiac catheterization    . Total knee arthroplasty Right   . Nasal sinus surgery    . Tonsillectomy    . Appendectomy    . Cyst removal on back    . Cervical spine surgery    . Knee surgery Left   . Abdominal hysterectomy    . Cataract extraction w/ intraocular lens implant Bilateral   . Ganglion cyst excision Bilateral      Social History:  The patient  reports that she has never smoked. She has never used smokeless tobacco. She reports that she does not drink alcohol or use illicit drugs.   Family History:  The patient's family history includes Brain cancer in her other; Cancer in  her mother; Colon cancer in her mother; Heart attack (age of onset: 30) in her father; Heart disease in her father, maternal aunt, and maternal uncle; Varicose Veins in her mother. There is no history of Pancreatic cancer, Prostate cancer, Stomach cancer, Liver disease, or Kidney disease.    ROS:  Please see the history of present illness. All other systems are reviewed and  Negative to the above problem except as noted.    PHYSICAL EXAM: VS:  BP 148/80 mmHg  Pulse 70  Ht 5\' 5"  (1.651 m)  Wt 75.751 kg (167 lb)  BMI 27.79 kg/m2  SpO2 95%  GEN: Well nourished, well developed, in no acute distress HEENT: normal Neck: no JVD, carotid bruits, or masses Cardiac: RRR; no murmurs, rubs, or gallops,no edema  Respiratory:  clear to auscultation bilaterally, normal work of breathing GI: soft, nontender, nondistended, + BS  No hepatomegaly  MS: no deformity Moving all extremities   Skin: warm and dry, no rash Neuro:  Strength and sensation are intact Psych: euthymic mood, full affect   EKG:  EKG is ordered today.  SR     Lipid Panel    Component Value Date/Time   CHOL 148 03/07/2014 1105   TRIG 58.0 03/07/2014 1105   TRIG 128 12/26/2006 1227   HDL 62.20 03/07/2014 1105   CHOLHDL 2 03/07/2014 1105   CHOLHDL 3.3 CALC 12/26/2006 1227   VLDL 11.6 03/07/2014 1105   LDLCALC 74 03/07/2014 1105      Wt Readings from Last 3 Encounters:  01/11/16 75.751 kg (167 lb)  12/27/15 78.472 kg (173 lb)  12/08/15 78.835 kg (173 lb 12.8 oz)      ASSESSMENT AND PLAN: 1  PAF  I am not convinced the pt is sympotmatic from afib   I have reviewed echo  There is signif TR but I am not convinced culprit for atrial fib   Continue anticoagulation    2  HTN  Adequate control    3  HL  Keep on statin    F/U later this spring        Signed, Consandra Laske, MD  01/11/2016 9:48 AM    [address], [place], [place]  [address] Phone: [phone]; Fax: [phone]

## 2016-01-15 DIAGNOSIS — E876 Hypokalemia: Secondary | ICD-10-CM | POA: Diagnosis not present

## 2016-01-15 DIAGNOSIS — R21 Rash and other nonspecific skin eruption: Secondary | ICD-10-CM | POA: Diagnosis not present

## 2016-01-15 DIAGNOSIS — Z7901 Long term (current) use of anticoagulants: Secondary | ICD-10-CM | POA: Diagnosis not present

## 2016-01-15 DIAGNOSIS — M25551 Pain in right hip: Secondary | ICD-10-CM | POA: Diagnosis not present

## 2016-01-15 DIAGNOSIS — M25562 Pain in left knee: Secondary | ICD-10-CM | POA: Diagnosis not present

## 2016-01-26 ENCOUNTER — Encounter: Payer: Self-pay | Admitting: Family Medicine

## 2016-01-26 ENCOUNTER — Ambulatory Visit (INDEPENDENT_AMBULATORY_CARE_PROVIDER_SITE_OTHER): Payer: Medicare Other | Admitting: Family Medicine

## 2016-01-26 ENCOUNTER — Other Ambulatory Visit (INDEPENDENT_AMBULATORY_CARE_PROVIDER_SITE_OTHER): Payer: Medicare Other

## 2016-01-26 VITALS — BP 100/68 | HR 76 | Ht 65.0 in | Wt 169.0 lb

## 2016-01-26 DIAGNOSIS — M7061 Trochanteric bursitis, right hip: Secondary | ICD-10-CM

## 2016-01-26 DIAGNOSIS — M25551 Pain in right hip: Secondary | ICD-10-CM | POA: Diagnosis not present

## 2016-01-26 DIAGNOSIS — M1712 Unilateral primary osteoarthritis, left knee: Secondary | ICD-10-CM | POA: Diagnosis not present

## 2016-01-26 NOTE — Patient Instructions (Signed)
Good to see you  Ice 20 minutes 2 times daily. Usually after activity and before bed. pennsaid pinkie amount topically 2 times daily as needed. \ We will get you fitted for a custom brace  Tylenol 500mg  3 times daily Vitamin D 2000 IU daily Turmeric 500mg  daily  Tart cherry extract any dose at night  See e again in 3-4 weeks and we will make sure you are doing better overall.

## 2016-01-26 NOTE — Assessment & Plan Note (Signed)
Patient does have severe likely bone-on-bone arthritic changes mostly of the medial compartment of the knee. Valgus deformity noted.

## 2016-01-26 NOTE — Progress Notes (Signed)
Pre visit review using our clinic review tool, if applicable. No additional management support is needed unless otherwise documented below in the visit note. 

## 2016-01-26 NOTE — Assessment & Plan Note (Signed)
Given an injection today with near complete resolution of pain immediately. This makes me highly optimistic that this is all secondary to the bursitis. We discussed icing, given a trial of a very low dose topical anti-inflammatory and warned of potential side effects with patient being on a blood thinner. This will be safer than oral anti-inflammatory. We discussed icing regimen. Patient also will try some over-the-counter natural supplementations that also was not have any effect with any other other medications. We discussed proper shoes. Patient and will come back and see me again in 4 weeks for further evaluation.

## 2016-01-26 NOTE — Progress Notes (Signed)
[name] [place], [place] [address] Phone: [phone] Subjective:    I'm seeing this patient by the request  of:  FULP, CAMMIE, MD   CC: Left knee pain and right.hip pain  [id number] [name] [name] is a 82 y.o. female coming in with complaint of left knee and right hip pain Regarding patient's right hip. Patient did fall in August of last year. Patient did have x-rays. These were independently visualized by me showing only mild osteophytic changes but no true fracture. States that she continues to have pain on the lateral aspect of the leg. Seems to get better with activity. Severely tender though when she wakes up in the morning and can even wake her up at night. Some mild radiation down the leg. Does have a past medical history significant for back sciatica and is in formal physical therapy at this time. Patient states though this seems to be a little bit different. Very localized to the lateral aspect of the hip and even tender to palpation she states. Rates the severity of pain a 7 out of 10. 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   She is also complaining of left knee pain. Seems to be in the posterior as well as the medial aspect of the knee. Seems to get her significant pain when going up stairs. States that sometimes it feels very unstable. Patient does have a past medical history for any repairs been on the contralateral side and states that it does feels securely similar. Sometimes has swelling. Rates the severity of pain a 7 out of 10 as well. Seems to be worsening over the course of time. Patient is unable to have any type of surgery because she is once again the primary caregiver for her husband.    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 Patient states  Past Medical History  Diagnosis Date  . Coronary artery  disease   . Hyperlipidemia   . Orthostatic hypotension   . Hypertension   . Osteoarthrosis, unspecified whether generalized or localized, unspecified site   . Family history of malignant neoplasm of gastrointestinal tract   . Allergic rhinitis, cause unspecified   . Unspecified functional disorder of intestine   . H/O: hysterectomy   . Osteoporosis   . Anxiety and depression   . GERD (gastroesophageal reflux disease)   . Hypothyroidism   . Chronic headaches   . DJD (degenerative joint disease)    Past Surgical History  Procedure Laterality Date  . Cardiac catheterization    . Total knee arthroplasty Right   . Nasal sinus surgery    . Tonsillectomy    . Appendectomy    . Cyst removal on back    . Cervical spine surgery    . Knee surgery Left   . Abdominal hysterectomy    . Cataract extraction w/ intraocular lens implant Bilateral   . Ganglion cyst excision Bilateral    Social History   Social History  . Marital Status: Married    Spouse Name: N/A  . Number of Children: 0  . Years of Education: N/A   Occupational History  . retired    Social History Main Topics  . Smoking status: Never Smoker   . Smokeless tobacco: Never Used     Comment: was a passive smoker for 15 yrs  . Alcohol Use: No  .  Drug Use: No  . Sexual Activity: Not Currently   Other Topics Concern  . None   Social History Narrative   Allergies  Allergen Reactions  . Amoxicillin     Lip/throat swelling  . Aspirin Other (See Comments)    Baby aspirin is tolerated, itching in the past  . Percocet [Oxycodone-Acetaminophen]   . Prednisone Other (See Comments)    Heart palpitations  . Sulfonamide Derivatives Other (See Comments)    Unknown reaction a long time ago   Family History  Problem Relation Age of Onset  . Brain cancer Other     1st degree relative  . Heart disease Maternal Uncle     x 4  . Colon cancer Mother   . Cancer Mother   . Varicose Veins Mother   . Heart attack Father 21   . Heart disease Father   . Heart disease Maternal Aunt     x 1  . Pancreatic cancer Neg Hx   . Prostate cancer Neg Hx   . Stomach cancer Neg Hx     pt notes that mother says she had "part of her stomach removed"  . Liver disease Neg Hx   . Kidney disease Neg Hx     Past medical history, social, surgical and family history all reviewed in electronic medical record.  No pertanent information unless stated regarding to the chief complaint.   Review of Systems: No headache, visual changes, nausea, vomiting, diarrhea, constipation, dizziness, abdominal pain, skin rash, fevers, chills, night sweats, weight loss, swollen lymph nodes, body aches, joint swelling, muscle aches, chest pain, shortness of breath, mood changes.   Objective Blood pressure 100/68, pulse 76, height 5\' 5"  (1.651 m), weight 169 lb (76.658 kg), SpO2 98 %.  General: No apparent distress alert and oriented x3 mood and affect normal, dressed appropriately.  HEENT: Pupils equal, extraocular movements intact  Respiratory: Patient's speak in full sentences and does not appear short of breath  Cardiovascular: No lower extremity edema, non tender, no erythema  Skin: Warm dry intact with no signs of infection or rash on extremities or on axial skeleton.  Abdomen: Soft nontender  Neuro: Cranial nerves II through XII are intact, neurovascularly intact in all extremities with 2+ DTRs and 2+ pulses.  Lymph: No lymphadenopathy of posterior or anterior cervical chain or axillae bilaterally.  Gait normal with good balance and coordination.  MSK:  Non tender with full range of motion and good stability and symmetric strength and tone of shoulders, elbows, wrist, , and ankles bilaterally. Significant arthritic changes of the hands and other joints. Patient does have anemia placement on the right side Right hip shows the patient does have some mild limitation with internal range of motion but no groin pain. Patient is tender to palpation over  the greater trochanteric area. Mild positive straight leg test with some tight hamstrings. Contralateral side also has tight hamstring. Nontender though to palpation.  Knee: Right Instability noted with valgus force. Patient does have a fairly large thigh to calf ratio. Moderate to severe tenderness to palpation over the medial joint line ROM full in flexion and extension and lower leg rotation. Ligaments with solid consistent endpoints including ACL, PCL, LCL, MCL.  painful patellar compression. Patellar glide with moderate crepitus. Patellar and quadriceps tendons unremarkable. Hamstring and quadriceps strength is normal.   MSK US performed of: Right This study was ordered, performed, and interpreted by Charlann Boxer D.O.  Hip: Trochanteric bursa with significant hypoechoic changes and swelling  Acetabular labrum visualized and without tears, displacement, or effusion in joint. Femoral neck appears unremarkable without increased power doppler signal along Cortex.  IMPRESSION:  Greater trochanter bursitis   Procedure: Real-time Ultrasound Guided Injection of right greater trochanteric bursitis secondary to patient's body habitus Device: GE Logiq E  Ultrasound guided injection is preferred based studies that show increased duration, increased effect, greater accuracy, decreased procedural pain, increased response rate, and decreased cost with ultrasound guided versus blind injection.  Verbal informed consent obtained.  Time-out conducted.  Noted no overlying erythema, induration, or other signs of local infection.  Skin prepped in a sterile fashion.  Local anesthesia: Topical Ethyl chloride.  With sterile technique and under real time ultrasound guidance:  Greater trochanteric area was visualized and patient's bursa was noted. A 22-gauge 3 inch needle was inserted and 4 cc of 0.5% Marcaine and 1 cc of Kenalog 40 mg/dL was injected. Pictures taken Completed without difficulty  Pain  immediately resolved suggesting accurate placement of the medication.  Advised to call if fevers/chills, erythema, induration, drainage, or persistent bleeding.  Images permanently stored and available for review in the ultrasound unit.  Impression: Technically successful ultrasound guided injection.   After informed written and verbal consent, patient was seated on exam table. Left knee was prepped with alcohol swab and utilizing anterolateral approach, patient's left knee space was injected with 4:1  marcaine 0.5%: Kenalog 40mg /dL. Patient tolerated the procedure well without immediate complications.   Impression and Recommendations:     This case required medical decision making of moderate complexity.

## 2016-02-07 ENCOUNTER — Telehealth: Payer: Self-pay | Admitting: *Deleted

## 2016-02-07 NOTE — Telephone Encounter (Signed)
Opened in error

## 2016-02-09 ENCOUNTER — Ambulatory Visit: Payer: Medicare Other | Admitting: Nurse Practitioner

## 2016-02-21 ENCOUNTER — Ambulatory Visit (INDEPENDENT_AMBULATORY_CARE_PROVIDER_SITE_OTHER): Payer: Medicare Other | Admitting: Family Medicine

## 2016-02-21 ENCOUNTER — Ambulatory Visit (INDEPENDENT_AMBULATORY_CARE_PROVIDER_SITE_OTHER)
Admission: RE | Admit: 2016-02-21 | Discharge: 2016-02-21 | Disposition: A | Discharge status: Home / Self Care | Payer: Medicare Other | Source: Ambulatory Visit | Attending: Family Medicine | Admitting: Family Medicine

## 2016-02-21 ENCOUNTER — Encounter: Payer: Self-pay | Admitting: Family Medicine

## 2016-02-21 VITALS — BP 112/70 | HR 58 | Ht 65.0 in | Wt 163.0 lb

## 2016-02-21 DIAGNOSIS — M1712 Unilateral primary osteoarthritis, left knee: Secondary | ICD-10-CM

## 2016-02-21 DIAGNOSIS — M7061 Trochanteric bursitis, right hip: Secondary | ICD-10-CM

## 2016-02-21 DIAGNOSIS — M5416 Radiculopathy, lumbar region: Secondary | ICD-10-CM | POA: Diagnosis not present

## 2016-02-21 DIAGNOSIS — M5136 Other intervertebral disc degeneration, lumbar region: Secondary | ICD-10-CM | POA: Diagnosis not present

## 2016-02-21 MED ORDER — GABAPENTIN 100 MG PO CAPS: 200.0000 mg | ORAL_CAPSULE | Freq: Every day | ORAL | Status: DC

## 2016-02-21 NOTE — Assessment & Plan Note (Signed)
Patient has known severe osteophytic changes of the lumbar spine. This was reviewed and independently visualized by me. This was from an MRI back in 2012. Patient did respond fairly well to epidural steroid injections by an outside provider. Discussed that this could be unfortunately continued to the pain. Patient given gabapentin to see if this would help. Encourage her to do the home exercises regularly. Return in 3-4 weeks. If worsening symptoms we'll consider repeat imaging. Impossible epidurals again.

## 2016-02-21 NOTE — Patient Instructions (Addendum)
Good to see you  Ice 20 minutes 2 times daily. Usually after activity and before bed. Continue the exercises Xray of back today downstairs, will call you if it changes anything.  Gabapentin 100mg  at night for first week then 200mg  nightly thereafter.  See me again in 4 weeks or call me sooner if you would like ot see Dr. Maryjean Ka or Physical therapy sooner.

## 2016-02-21 NOTE — Assessment & Plan Note (Signed)
Likely more secondary to lumbar radiculopathy will continue to monitor.

## 2016-02-21 NOTE — Progress Notes (Signed)
Pre visit review using our clinic review tool, if applicable. No additional management support is needed unless otherwise documented below in the visit note. 

## 2016-02-21 NOTE — Assessment & Plan Note (Signed)
Improved after injection. Patient is still being fitted for custom brace. We discussed icing regimen. Patient has any worsening symptoms she could be a candidate for viscous supplementation. Otherwise he can repeat steroid injection every 3-4 months. Patient was to avoid any surgical intervention.

## 2016-02-21 NOTE — Progress Notes (Signed)
[name] [address] [place], [place] [address] Phone: [phone] Subjective:    CC: Left knee pain and right.hip pain f/u   [id number] [name] [name] is a 82 y.o. female coming in with complaint of left knee and right hip pain Regarding patient's right hip. Patient did fall in August of last year. Patient did have x-rays. These were independently visualized by me showing only mild osteophytic changes but no true fracture. Patient was seen and diagnosed with more of a greater trochanteric bursitis and was given an injection 3 weeks ago. Patient was to do home exercises, icing protocol and given a trial topical anti-inflammatories. Patient states right hip continues to give her pain. Seems to be more of a dull aching sensation. States that certain range of motion can give her a sharp pain. So given her discomfort at night. No weakness of the leg.  She is also complaining of left knee pain. Seems to be in the posterior as well as the medial aspect of the knee. Seems to get her significant pain when going up stairs. States that sometimes it feels very unstable. Patient does have a past medical history for replacement on the contralateral side. Patient was seen and was found to have severe osteoarthritic changes of the left knee. Was given an injection at last exam. Patient was to do over-the-counter medications as well as get set up for custom brace. Patient states 100% better. Happy with the results. No significant pain at all at this moment.    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  Past Medical History  Diagnosis Date  . Coronary artery disease   . Hyperlipidemia   . Orthostatic hypotension   . Hypertension   . Osteoarthrosis, unspecified whether generalized or localized, unspecified site   . Family history of malignant neoplasm of gastrointestinal tract   . Allergic rhinitis,  cause unspecified   . Unspecified functional disorder of intestine   . H/O: hysterectomy   . Osteoporosis   . Anxiety and depression   . GERD (gastroesophageal reflux disease)   . Hypothyroidism   . Chronic headaches   . DJD (degenerative joint disease)    Past Surgical History  Procedure Laterality Date  . Cardiac catheterization    . Total knee arthroplasty Right   . Nasal sinus surgery    . Tonsillectomy    . Appendectomy    . Cyst removal on back    . Cervical spine surgery    . Knee surgery Left   . Abdominal hysterectomy    . Cataract extraction w/ intraocular lens implant Bilateral   . Ganglion cyst excision Bilateral    Social History   Social History  . Marital Status: Married    Spouse Name: N/A  . Number of Children: 0  . Years of Education: N/A   Occupational History  . retired    Social History Main Topics  . Smoking status: Never Smoker   . Smokeless tobacco: Never Used     Comment: was a passive smoker for 15 yrs  . Alcohol Use: No  . Drug Use: No  . Sexual Activity: Not Currently   Other Topics Concern  . None   Social History Narrative   Allergies  Allergen Reactions  . Amoxicillin     Lip/throat swelling  . Aspirin Other (See Comments)    Baby aspirin is tolerated, itching in the past  .  Percocet [Oxycodone-Acetaminophen]   . Prednisone Other (See Comments)    Heart palpitations  . Sulfonamide Derivatives Other (See Comments)    Unknown reaction a long time ago   Family History  Problem Relation Age of Onset  . Brain cancer Other     1st degree relative  . Heart disease Maternal Uncle     x 4  . Colon cancer Mother   . Cancer Mother   . Varicose Veins Mother   . Heart attack Father 2  . Heart disease Father   . Heart disease Maternal Aunt     x 1  . Pancreatic cancer Neg Hx   . Prostate cancer Neg Hx   . Stomach cancer Neg Hx     pt notes that mother says she had "part of her stomach removed"  . Liver disease Neg Hx   .  Kidney disease Neg Hx     Past medical history, social, surgical and family history all reviewed in electronic medical record.  No pertanent information unless stated regarding to the chief complaint.   Review of Systems: No headache, visual changes, nausea, vomiting, diarrhea, constipation, dizziness, abdominal pain, skin rash, fevers, chills, night sweats, weight loss, swollen lymph nodes, body aches, joint swelling, muscle aches, chest pain, shortness of breath, mood changes.   Objective Blood pressure 112/70, pulse 58, height 5\' 5"  (1.651 m), weight 163 lb (73.936 kg), SpO2 94 %.  General: No apparent distress alert and oriented x3 mood and affect normal, dressed appropriately.  HEENT: Pupils equal, extraocular movements intact  Respiratory: Patient's speak in full sentences and does not appear short of breath  Cardiovascular: No lower extremity edema, non tender, no erythema  Skin: Warm dry intact with no signs of infection or rash on extremities or on axial skeleton.  Abdomen: Soft nontender  Neuro: Cranial nerves II through XII are intact, neurovascularly intact in all extremities with 2+ DTRs and 2+ pulses.  Lymph: No lymphadenopathy of posterior or anterior cervical chain or axillae bilaterally.  Gait normal with good balance and coordination.  MSK:  Non tender with full range of motion and good stability and symmetric strength and tone of shoulders, elbows, wrist, , and ankles bilaterally. Significant arthritic changes of the hands and other joints. Patient does have anemia placement on the right side Right hip shows the patient does have some mild limitation with internal range of motion but no groin pain. Patient is nontender over the greater trochanteric bursitis. Patient has a positive straight leg test.  Knee: Right Instability noted with valgus force. Patient does have a fairly large thigh to calf ratio. Mild to moderate tenderness still remaining. ROM full in flexion and  extension and lower leg rotation. Ligaments with solid consistent endpoints including ACL, PCL, LCL, MCL.  painful patellar compression. Patellar glide with moderate crepitus. Patellar and quadriceps tendons unremarkable. Hamstring and quadriceps strength is normal.  Moderate improvement from previous exam    Impression and Recommendations:     This case required medical decision making of moderate complexity.

## 2016-03-08 ENCOUNTER — Encounter: Payer: Self-pay | Admitting: Gastroenterology

## 2016-03-11 ENCOUNTER — Emergency Department (HOSPITAL_COMMUNITY)
Admission: EM | Admit: 2016-03-11 | Discharge: 2016-03-11 | Disposition: A | Discharge status: Home / Self Care | Payer: Medicare Other | Attending: Emergency Medicine | Admitting: Emergency Medicine

## 2016-03-11 ENCOUNTER — Emergency Department (HOSPITAL_COMMUNITY): Payer: Medicare Other

## 2016-03-11 ENCOUNTER — Encounter (HOSPITAL_COMMUNITY): Payer: Self-pay | Admitting: Emergency Medicine

## 2016-03-11 DIAGNOSIS — R519 Headache, unspecified: Secondary | ICD-10-CM

## 2016-03-11 DIAGNOSIS — K219 Gastro-esophageal reflux disease without esophagitis: Secondary | ICD-10-CM | POA: Diagnosis not present

## 2016-03-11 DIAGNOSIS — Z880 Allergy status to penicillin: Secondary | ICD-10-CM | POA: Diagnosis not present

## 2016-03-11 DIAGNOSIS — S8011XA Contusion of right lower leg, initial encounter: Secondary | ICD-10-CM | POA: Diagnosis not present

## 2016-03-11 DIAGNOSIS — Z7982 Long term (current) use of aspirin: Secondary | ICD-10-CM | POA: Diagnosis not present

## 2016-03-11 DIAGNOSIS — F419 Anxiety disorder, unspecified: Secondary | ICD-10-CM | POA: Insufficient documentation

## 2016-03-11 DIAGNOSIS — Y9389 Activity, other specified: Secondary | ICD-10-CM | POA: Diagnosis not present

## 2016-03-11 DIAGNOSIS — F329 Major depressive disorder, single episode, unspecified: Secondary | ICD-10-CM | POA: Insufficient documentation

## 2016-03-11 DIAGNOSIS — R51 Headache: Secondary | ICD-10-CM | POA: Diagnosis not present

## 2016-03-11 DIAGNOSIS — E785 Hyperlipidemia, unspecified: Secondary | ICD-10-CM | POA: Insufficient documentation

## 2016-03-11 DIAGNOSIS — R404 Transient alteration of awareness: Secondary | ICD-10-CM | POA: Diagnosis not present

## 2016-03-11 DIAGNOSIS — I2510 Atherosclerotic heart disease of native coronary artery without angina pectoris: Secondary | ICD-10-CM | POA: Insufficient documentation

## 2016-03-11 DIAGNOSIS — Y998 Other external cause status: Secondary | ICD-10-CM | POA: Insufficient documentation

## 2016-03-11 DIAGNOSIS — Z8739 Personal history of other diseases of the musculoskeletal system and connective tissue: Secondary | ICD-10-CM | POA: Insufficient documentation

## 2016-03-11 DIAGNOSIS — Y9289 Other specified places as the place of occurrence of the external cause: Secondary | ICD-10-CM | POA: Diagnosis not present

## 2016-03-11 DIAGNOSIS — H1132 Conjunctival hemorrhage, left eye: Secondary | ICD-10-CM | POA: Diagnosis not present

## 2016-03-11 DIAGNOSIS — S0990XA Unspecified injury of head, initial encounter: Secondary | ICD-10-CM | POA: Insufficient documentation

## 2016-03-11 DIAGNOSIS — E039 Hypothyroidism, unspecified: Secondary | ICD-10-CM | POA: Diagnosis not present

## 2016-03-11 DIAGNOSIS — Z90710 Acquired absence of both cervix and uterus: Secondary | ICD-10-CM | POA: Diagnosis not present

## 2016-03-11 DIAGNOSIS — I10 Essential (primary) hypertension: Secondary | ICD-10-CM | POA: Insufficient documentation

## 2016-03-11 DIAGNOSIS — S0093XA Contusion of unspecified part of head, initial encounter: Secondary | ICD-10-CM | POA: Diagnosis not present

## 2016-03-11 DIAGNOSIS — Z8719 Personal history of other diseases of the digestive system: Secondary | ICD-10-CM | POA: Diagnosis not present

## 2016-03-11 DIAGNOSIS — W19XXXA Unspecified fall, initial encounter: Secondary | ICD-10-CM | POA: Diagnosis not present

## 2016-03-11 DIAGNOSIS — Z79899 Other long term (current) drug therapy: Secondary | ICD-10-CM | POA: Insufficient documentation

## 2016-03-11 DIAGNOSIS — R42 Dizziness and giddiness: Secondary | ICD-10-CM | POA: Diagnosis not present

## 2016-03-11 DIAGNOSIS — W01198A Fall on same level from slipping, tripping and stumbling with subsequent striking against other object, initial encounter: Secondary | ICD-10-CM | POA: Diagnosis not present

## 2016-03-11 LAB — CBC
HEMATOCRIT: 40.1 % (ref 36.0–46.0)
HEMOGLOBIN: 12.9 g/dL (ref 12.0–15.0)
MCH: 28.5 pg (ref 26.0–34.0)
MCHC: 32.2 g/dL (ref 30.0–36.0)
MCV: 88.7 fL (ref 78.0–100.0)
Platelets: 413 10*3/uL — ABNORMAL HIGH (ref 150–400)
RBC: 4.52 MIL/uL (ref 3.87–5.11)
RDW: 14.1 % (ref 11.5–15.5)
WBC: 7.7 10*3/uL (ref 4.0–10.5)

## 2016-03-11 LAB — BASIC METABOLIC PANEL
Anion gap: 12 (ref 5–15)
BUN: 14 mg/dL (ref 6–20)
CALCIUM: 9.6 mg/dL (ref 8.9–10.3)
CHLORIDE: 99 mmol/L — AB (ref 101–111)
CO2: 27 mmol/L (ref 22–32)
Creatinine, Ser: 0.88 mg/dL (ref 0.44–1.00)
GFR calc Af Amer: >60 mL/min (ref >60)
GFR, EST NON AFRICAN AMERICAN: 60 mL/min — AB (ref >60)
GLUCOSE: 83 mg/dL (ref 65–99)
Potassium: 4.3 mmol/L (ref 3.5–5.1)
Sodium: 138 mmol/L (ref 135–145)

## 2016-03-11 LAB — URINALYSIS, ROUTINE W REFLEX MICROSCOPIC
BILIRUBIN URINE: NEGATIVE
Glucose, UA: NEGATIVE mg/dL
Hgb urine dipstick: NEGATIVE
Ketones, ur: NEGATIVE mg/dL
LEUKOCYTES UA: NEGATIVE
NITRITE: NEGATIVE
PH: 7 (ref 5.0–8.0)
Protein, ur: NEGATIVE mg/dL
SPECIFIC GRAVITY, URINE: 1.009 (ref 1.005–1.030)

## 2016-03-11 NOTE — ED Provider Notes (Addendum)
CSN: [id number]     Arrival date & time 03/11/16  1930 History   First MD Initiated Contact with Patient 03/11/16 2012     Chief Complaint  Patient presents with  . Dizziness  . Headache     (Consider location/radiation/quality/duration/timing/severity/associated sxs/prior Treatment) HPI 82 year old female comes in today complaining of right sided headache. She states that she fell 9 days ago. She was very lightheaded and fell backwards striking the back of her head. She did not note any bleeding at that time. She has had some headache on the right temple area since that time. She has noticed some discoloration of the left conjunctiva is concerned regarding this. She has contusion of her right leg and her right arm. She has been able to ambulate without difficulty. She is on eliquis for atrial fibrillation. She went to see her doctor at [facility] today. She was told to come to the ED for further evaluation. Past Medical History  Diagnosis Date  . Coronary artery disease   . Hyperlipidemia   . Orthostatic hypotension   . Hypertension   . Osteoarthrosis, unspecified whether generalized or localized, unspecified site   . Family history of malignant neoplasm of gastrointestinal tract   . Allergic rhinitis, cause unspecified   . Unspecified functional disorder of intestine   . H/O: hysterectomy   . Osteoporosis   . Anxiety and depression   . GERD (gastroesophageal reflux disease)   . Hypothyroidism   . Chronic headaches   . DJD (degenerative joint disease)    Past Surgical History  Procedure Laterality Date  . Cardiac catheterization    . Total knee arthroplasty Right   . Nasal sinus surgery    . Tonsillectomy    . Appendectomy    . Cyst removal on back    . Cervical spine surgery    . Knee surgery Left   . Abdominal hysterectomy    . Cataract extraction w/ intraocular lens implant Bilateral   . Ganglion cyst excision Bilateral    Family History  Problem Relation Age of  Onset  . Brain cancer Other     1st degree relative  . Heart disease Maternal Uncle     x 4  . Colon cancer Mother   . Cancer Mother   . Varicose Veins Mother   . Heart attack Father 54  . Heart disease Father   . Heart disease Maternal Aunt     x 1  . Pancreatic cancer Neg Hx   . Prostate cancer Neg Hx   . Stomach cancer Neg Hx     pt notes that mother says she had "part of her stomach removed"  . Liver disease Neg Hx   . Kidney disease Neg Hx    Social History  Substance Use Topics  . Smoking status: Never Smoker   . Smokeless tobacco: Never Used     Comment: was a passive smoker for 15 yrs  . Alcohol Use: No   OB History    No data available     Review of Systems  All other systems reviewed and are negative.     Allergies  Isosorbide dinitrate; Prednisone; Amoxicillin; Sudafed; Aspirin; Amoxicillin-pot clavulanate; Cephalexin; Percocet; Sulfa antibiotics; and Sulfonamide derivatives  Home Medications   Prior to Admission medications   Medication Sig Start Date End Date Taking? Authorizing Provider  acetaminophen (TYLENOL) 500 MG tablet Take 500 mg by mouth every 6 (six) hours as needed for moderate pain.    Yes Historical  Provider, MD  apixaban (ELIQUIS) 5 MG TABS tablet Take 1 tablet (5 mg total) by mouth 2 (two) times daily. 12/08/15  Yes Fay Records, MD  ascorbic acid (VITAMIN C) 1000 MG tablet Take 1,000 mg by mouth daily.   Yes Historical Provider, MD  aspirin EC 81 MG tablet Take 81 mg by mouth 2 (two) times daily.   Yes Historical Provider, MD  atenolol (TENORMIN) 50 MG tablet Take 50 mg by mouth 2 (two) times daily.    Yes Historical Provider, MD  atorvastatin (LIPITOR) 20 MG tablet Take 20 mg by mouth daily at 6 PM.    Yes Historical Provider, MD  docusate sodium (COLACE) 100 MG capsule Take 100 mg by mouth daily.   Yes Historical Provider, MD  DULoxetine (CYMBALTA) 60 MG capsule Take 60 mg by mouth daily.   Yes Historical Provider, MD  esomeprazole  (NEXIUM) 40 MG capsule Take 40 mg by mouth daily at 12 noon.   Yes Historical Provider, MD  furosemide (LASIX) 20 MG tablet Take 20 mg by mouth daily.    Yes Historical Provider, MD  levothyroxine (SYNTHROID, LEVOTHROID) 112 MCG tablet Take 112 mcg by mouth daily before breakfast.   Yes Historical Provider, MD  nitroGLYCERIN (NITROSTAT) 0.4 MG SL tablet Place 1 tablet (0.4 mg total) under the tongue every 5 (five) minutes as needed for chest pain. 07/06/14  Yes Burtis Junes, NP  potassium chloride (K-DUR) 10 MEQ tablet Take 2 tablets (20 mEq total) by mouth daily. Patient taking differently: Take 10 mEq by mouth daily.  12/13/15  Yes Fay Records, MD  tiZANidine (ZANAFLEX) 2 MG tablet TAKE (1) TABLET EVERY EIGHT HOURS AS NEEDED FOR NECK PAIN 09/05/15  Yes Pieter Partridge, DO  valACYclovir (VALTREX) 1000 MG tablet Take 1,000 mg by mouth as needed ([place]).  10/26/14  Yes Historical Provider, MD  valsartan (DIOVAN) 80 MG tablet Take 80 mg by mouth 2 (two) times daily.   Yes Historical Provider, MD  verapamil (COVERA HS) 180 MG (CO) 24 hr tablet Take 180 mg by mouth at bedtime.   Yes Historical Provider, MD  gabapentin (NEURONTIN) 100 MG capsule Take 2 capsules (200 mg total) by mouth at bedtime. 02/21/16   Lyndal Pulley, DO   BP 203/90 mmHg  Pulse 57  Temp(Src) 97.7 F (36.5 C) (Oral)  Resp 14  Ht 5\' 5"  (1.651 m)  Wt 74.39 kg  BMI 27.29 kg/m2  SpO2 97% Physical Exam  Constitutional: She is oriented to person, place, and time. She appears well-developed and well-nourished.  HENT:  Head: Normocephalic and atraumatic.  Right Ear: Tympanic membrane and external ear normal.  Left Ear: Tympanic membrane and external ear normal.  Nose: Nose normal. Right sinus exhibits no maxillary sinus tenderness and no frontal sinus tenderness. Left sinus exhibits no maxillary sinus tenderness and no frontal sinus tenderness.  Eyes: EOM are normal. Pupils are equal, round, and reactive to light. Right eye  exhibits no nystagmus. Left eye exhibits no nystagmus.  Left subconjunctival hemorrhage. Pupils are equal round react to light  Neck: Normal range of motion. Neck supple.  Cardiovascular: Normal rate, regular rhythm, normal heart sounds and intact distal pulses.   Pulmonary/Chest: Effort normal and breath sounds normal. No respiratory distress. She exhibits no tenderness.  Abdominal: Soft. Bowel sounds are normal. She exhibits no distension and no mass. There is no tenderness.  Musculoskeletal: Normal range of motion. She exhibits no edema or tenderness.  Right lower  extremity contusion with no bony point tenderness and full active range of motion of shoulder elbow and wrist. Right lateral thigh contusion without tenderness on hip or pelvic palpation.  Neurological: She is alert and oriented to person, place, and time. She has normal strength. She displays abnormal reflex. No cranial nerve deficit or sensory deficit. She exhibits normal muscle tone. She displays a negative Romberg sign. Coordination normal. GCS eye subscore is 4. GCS verbal subscore is 5. GCS motor subscore is 6.  Reflex Scores:      Tricep reflexes are 2+ on the right side and 2+ on the left side.      Bicep reflexes are 2+ on the right side and 2+ on the left side.      Brachioradialis reflexes are 2+ on the right side and 2+ on the left side.      Patellar reflexes are 2+ on the right side and 2+ on the left side.      Achilles reflexes are 2+ on the right side and 2+ on the left side. Patient with normal gait without ataxia, shuffling, spasm, or antalgia. Speech is normal without dysarthria, dysphasia, or aphasia. Muscle strength is 5/5 in bilateral shoulders, elbow flexor and extensors, wrist flexor and extensors, and intrinsic hand muscles. 5/5 bilateral lower extremity hip flexors, extensors, knee flexors and extensors, and ankle dorsi and plantar flexors.    Skin: Skin is warm and dry. No rash noted.  Psychiatric: She has  a normal mood and affect. Her behavior is normal. Judgment and thought content normal.  Nursing note and vitals reviewed.   ED Course  Procedures (including critical care time) Labs Review Labs Reviewed  BASIC METABOLIC PANEL - Abnormal; Notable for the following:    Chloride 99 (*)    GFR calc non Af Amer 60 (*)    All other components within normal limits  CBC - Abnormal; Notable for the following:    Platelets 413 (*)    All other components within normal limits  URINALYSIS, ROUTINE W REFLEX MICROSCOPIC (NOT AT [facility])    Imaging Review No results found. I have personally reviewed and evaluated these images and lab results as part of my medical decision-making.   EKG Interpretation   Date/Time:  Monday March 11 2016 20:12:02 EDT Ventricular Rate:  54 PR Interval:  198 QRS Duration: 91 QT Interval:  453 QTC Calculation: 429 R Axis:   58 Text Interpretation:  Sinus rhythm Anteroseptal infarct, age indeterminate  Confirmed by Leetta Hendriks MD, Andee Poles [phone]) on 03/11/2016 8:50:43 PM      MDM   Final diagnoses:  Acute nonintractable headache, unspecified headache type  Contusion of right lower extremity, initial encounter    Patient with headache and contusion after fall on eliquis.  Fall preceded symptoms but occurred 9 days ago.  No bleeding on head ct. Patient neurologically intact.  Advised regarding return precautions and voices understanding. Hypertension noted- patient not taking her meds as normal today.  Advised regarding taking meds as rx'd and need for recheck.      Pattricia Boss, MD 03/11/16 2317  Pattricia Boss, MD 03/11/16 2322

## 2016-03-11 NOTE — Discharge Instructions (Signed)
There is no evidence of bleeding on your head CT. Please return if headache worsens, vision changes, or you become weak on one side or the other.  General Headache Without Cause A headache is pain or discomfort felt around the head or neck area. There are many causes and types of headaches. In some cases, the cause may not be found.  HOME CARE  Managing Pain  Take over-the-counter and prescription medicines only as told by your doctor.  Lie down in a dark, quiet room when you have a headache.  If directed, apply ice to the head and neck area:  Put ice in a plastic bag.  Place a towel between your skin and the bag.  Leave the ice on for 20 minutes, 2-3 times per day.  Use a heating pad or hot shower to apply heat to the head and neck area as told by your doctor.  Keep lights dim if bright lights bother you or make your headaches worse. Eating and Drinking  Eat meals on a regular schedule.  Lessen how much alcohol you drink.  Lessen how much caffeine you drink, or stop drinking caffeine. General Instructions  Keep all follow-up visits as told by your doctor. This is important.  Keep a journal to find out if certain things bring on headaches. For example, write down:  What you eat and drink.  How much sleep you get.  Any change to your diet or medicines.  Relax by getting a massage or doing other relaxing activities.  Lessen stress.  Sit up straight. Do not tighten (tense) your muscles.  Do not use tobacco products. This includes cigarettes, chewing tobacco, or e-cigarettes. If you need help quitting, ask your doctor.  Exercise regularly as told by your doctor.  Get enough sleep. This often means 7-9 hours of sleep. GET HELP IF:  Your symptoms are not helped by medicine.  You have a headache that feels different than the other headaches.  You feel sick to your stomach (nauseous) or you throw up (vomit).  You have a fever. GET HELP RIGHT AWAY IF:   Your  headache becomes really bad.  You keep throwing up.  You have a stiff neck.  You have trouble seeing.  You have trouble speaking.  You have pain in the eye or ear.  Your muscles are weak or you lose muscle control.  You lose your balance or have trouble walking.  You feel like you will pass out (faint) or you pass out.  You have confusion.   This information is not intended to replace advice given to you by your health care provider. Make sure you discuss any questions you have with your health care provider.   Document Released: 09/24/2008 Document Revised: 09/06/2015 Document Reviewed: 04/10/2015 Elsevier Interactive Patient Education [employer].

## 2016-03-11 NOTE — ED Notes (Signed)
Pt arrives by [facility] from [facility]. Pt fell Saturday outside at home and hit her head on the cement. Pt did not get treatment after fall. Pt reports increasing dizziness and headaches since the fall. Pt reports that dizziness had been going on prior to her fall, but has gotten worse since. Pt's left eye is bloodshot, no leaking fluids and pt reports blood clots when she blows her nose. Pt denies n/v, abd/neck/back pain. No head lac, no LOC, no AMS. Pt A&O x4. Last vitals 198/104, HR 60, 97% RA. Pt is on Eliquis.

## 2016-03-15 DIAGNOSIS — J301 Allergic rhinitis due to pollen: Secondary | ICD-10-CM | POA: Diagnosis not present

## 2016-03-15 DIAGNOSIS — J3089 Other allergic rhinitis: Secondary | ICD-10-CM | POA: Diagnosis not present

## 2016-03-18 ENCOUNTER — Telehealth: Payer: Self-pay | Admitting: Internal Medicine

## 2016-03-18 NOTE — Telephone Encounter (Signed)
Pt has been taking 81 mg of ASA TWICE daily since being diagnosed with A. Fib even once starting Eliquis 5 mg BID. Pt has not missed any doses of Eliquis since starting Pt stated she has experienced lightheadedness occasionally over the past few weeks. Pt experienced a fall a few weeks ago and was seen in the ED last week where test came back negative. Pt instructed she did not need to take both medications and would forward to Dr. Harrington Challenger in case she wanted additional medication adjustments.  Pt also stated she checks her BP daily and they have been within normal range (90/60 and 140/90) given to her by her PCP.  Pt has not experienced any palpitations or chest pain.

## 2016-03-18 NOTE — Telephone Encounter (Signed)
She can stop ASA and keep on Eliiquis  Decrease risk of bleeding

## 2016-03-18 NOTE — Telephone Encounter (Signed)
New message     Patient calling    Pt c/o medication issue:  1. Name of Medication:eliquis  5 mg  2. How are you currently taking this medication (dosage and times per day)? Twice a day   3. Are you having a reaction (difficulty breathing--STAT)?no   4. What is your medication issue? Can she take a baby ASA.

## 2016-03-18 NOTE — Telephone Encounter (Signed)
Patient called back and stated that she has been taking asa 81mg  bid at the same time that she takes the eliquis. She has been experiencing some dizziness and stated that she fell earlier this month and had multiple bruises. She is taking her husband to an appt today and stated that it is okay to leave a detailed vm on her home phone [phone].

## 2016-03-19 ENCOUNTER — Telehealth: Payer: Self-pay | Admitting: Internal Medicine

## 2016-03-19 NOTE — Telephone Encounter (Signed)
Patient informed to stop aspirin, continue Eliquis. Today she has started cutting atenolol in 1/2.  Will take 1/2 twice daily and monitor BP (this was discussed at last OV with Dr. Harrington Challenger) I advised to bring a list of BP readings to her next visit.

## 2016-03-19 NOTE — Telephone Encounter (Signed)
Left detailed message on voice mail to stop asa and continue Eliquis. Advised to call back with any other questions/concerns.

## 2016-03-19 NOTE — Telephone Encounter (Signed)
Follow up     Patient calling back to speak with nurse. The detail message was cut off on voice mail.

## 2016-03-25 ENCOUNTER — Ambulatory Visit (INDEPENDENT_AMBULATORY_CARE_PROVIDER_SITE_OTHER): Payer: Medicare Other | Admitting: Family Medicine

## 2016-03-25 ENCOUNTER — Encounter: Payer: Self-pay | Admitting: Family Medicine

## 2016-03-25 VITALS — BP 122/80 | HR 62 | Wt 161.0 lb

## 2016-03-25 DIAGNOSIS — M1712 Unilateral primary osteoarthritis, left knee: Secondary | ICD-10-CM | POA: Diagnosis not present

## 2016-03-25 DIAGNOSIS — S7001XA Contusion of right hip, initial encounter: Secondary | ICD-10-CM | POA: Diagnosis not present

## 2016-03-25 DIAGNOSIS — M5416 Radiculopathy, lumbar region: Secondary | ICD-10-CM | POA: Diagnosis not present

## 2016-03-25 MED ORDER — VITAMIN D (ERGOCALCIFEROL) 1.25 MG (50000 UNIT) PO CAPS: 50000.0000 [IU] | ORAL_CAPSULE | ORAL | Status: DC

## 2016-03-25 NOTE — Assessment & Plan Note (Signed)
Neck seems stable at this time. Degenerative disc disease at L1-L2. We will need to continue to monitor. If any worsening symptoms we will consider further treatment options are advanced imaging. Patient is on once weekly vitamin D and wanted to consider repeating bone density in 6 months.

## 2016-03-25 NOTE — Assessment & Plan Note (Signed)
Patient is a mild contusion of the right hip. I do think that patient's blood thinner likely congealed into the amount of bruising. We discussed with patient to hold the aspirin but will continue the but that until cc her cardiologist this Friday. We discussed icing and over-the-counter lotions a can help with the bruising. Patient will see as well as continues to get better we will continue to monitor. Patient will follow-up again in 2 weeks.

## 2016-03-25 NOTE — Patient Instructions (Signed)
Good to see you  Ice 20 minutes 2 times daily. Usually after activity and before bed. For the back we can try once weekly vitamin D for next 12 weeks.  Try arnica lotion daily on the bruises until they resolve See me again in 2-3 weeks and if knee is still painful we can inject again.

## 2016-03-25 NOTE — Progress Notes (Signed)
[name] [address] [place], [place] [address] Phone: [phone] Subjective:    CC: Left knee pain and right.hip pain f/u   [id number] [name] [name] is a 82 y.o. female coming in with complaint of left knee and right hip pain Regarding patient's right hip. Patient was doing significantly better but unfortunately did have a recent fall. Fell onto her right side. Has a bruise on the side of her hip. States that this is cut some discomfort but not as severe as what it was prior to the injection. Patient has not tried any home modalities and is only been icing very minimal. Patient states that the pain is approximate 5 out of 10 at this time. States that it only hurts when she rolls onto that side.  She is also complaining of left knee pain. Patient was seen previously and oral seems to be doing better with the knee. Still having some mild discomfort. No significant sputum. Has been 8 weeks since her last injection. Since the fall though she has been compensating and she feels that the knee is starting to worsen very slightly.    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  Past Medical History  Diagnosis Date  . Coronary artery disease   . Hyperlipidemia   . Orthostatic hypotension   . Hypertension   . Osteoarthrosis, unspecified whether generalized or localized, unspecified site   . Family history of malignant neoplasm of gastrointestinal tract   . Allergic rhinitis, cause unspecified   . Unspecified functional disorder of intestine   . H/O: hysterectomy   . Osteoporosis   . Anxiety and depression   . GERD (gastroesophageal reflux disease)   . Hypothyroidism   . Chronic headaches   . DJD (degenerative joint disease)    Past Surgical History  Procedure Laterality Date  . Cardiac catheterization    . Total knee arthroplasty Right   . Nasal sinus surgery    .  Tonsillectomy    . Appendectomy    . Cyst removal on back    . Cervical spine surgery    . Knee surgery Left   . Abdominal hysterectomy    . Cataract extraction w/ intraocular lens implant Bilateral   . Ganglion cyst excision Bilateral    Social History   Social History  . Marital Status: Married    Spouse Name: N/A  . Number of Children: 0  . Years of Education: N/A   Occupational History  . retired    Social History Main Topics  . Smoking status: Never Smoker   . Smokeless tobacco: Never Used     Comment: was a passive smoker for 15 yrs  . Alcohol Use: No  . Drug Use: No  . Sexual Activity: Not Currently   Other Topics Concern  . Not on file   Social History Narrative   Allergies  Allergen Reactions  . Isosorbide Dinitrate Other (See Comments)    Other reaction(s): severe headaches  . Prednisone Other (See Comments)    Heart palpitations  . Amoxicillin Swelling    Lip/throat swelling  . Sudafed [Pseudoephedrine] Other (See Comments)    Other reaction(s): rash  . Aspirin Other (See Comments)    Baby aspirin is tolerated, itching in the past  . Amoxicillin-Pot Clavulanate Swelling and Other (See Comments)    Other reaction(s): Facial and tounge swelling  . Cephalexin Other (See Comments)  .  Percocet [Oxycodone-Acetaminophen] Other (See Comments)  . Sulfa Antibiotics Other (See Comments)  . Sulfonamide Derivatives Other (See Comments)    Unknown reaction a long time ago   Family History  Problem Relation Age of Onset  . Brain cancer Other     1st degree relative  . Heart disease Maternal Uncle     x 4  . Colon cancer Mother   . Cancer Mother   . Varicose Veins Mother   . Heart attack Father 12  . Heart disease Father   . Heart disease Maternal Aunt     x 1  . Pancreatic cancer Neg Hx   . Prostate cancer Neg Hx   . Stomach cancer Neg Hx     pt notes that mother says she had "part of her stomach removed"  . Liver disease Neg Hx   . Kidney disease  Neg Hx     Past medical history, social, surgical and family history all reviewed in electronic medical record.  No pertanent information unless stated regarding to the chief complaint.   Review of Systems: No headache, visual changes, nausea, vomiting, diarrhea, constipation, dizziness, abdominal pain, skin rash, fevers, chills, night sweats, weight loss, swollen lymph nodes, body aches, joint swelling, muscle aches, chest pain, shortness of breath, mood changes.   Objective Blood pressure 122/80, pulse 62, weight 161 lb (73.029 kg).  General: No apparent distress alert and oriented x3 mood and affect normal, dressed appropriately.  HEENT: Pupils equal, extraocular movements intact  Respiratory: Patient's speak in full sentences and does not appear short of breath  Cardiovascular: No lower extremity edema, non tender, no erythema  Skin: Warm dry intact with no signs of infection or rash on extremities or on axial skeleton.  Abdomen: Soft nontender  Neuro: Cranial nerves II through XII are intact, neurovascularly intact in all extremities with 2+ DTRs and 2+ pulses.  Lymph: No lymphadenopathy of posterior or anterior cervical chain or axillae bilaterally.  Gait Very mild antalgic gait which is new from previous exam. MSK:  Non tender with full range of motion and good stability and symmetric strength and tone of shoulders, elbows, wrist, , and ankles bilaterally. Significant arthritic changes of the hands and other joints. Patient does have anemia placement on the right side Right hip shows the patient does have some mild limitation with internal range of motion but no groin pain. On inspection patient does have a large bruise noted on the lateral aspect of the hip. No hematoma noted. Patient is tender to palpation in this area. No significant crepitus of the skin. No worsening of the decreased range of motion of the hip. Is able to bear weight.  Knee: Left I patient does have some mild  instability of the knees noted. Mild to moderate tenderness still remaining. ROM full in flexion and extension and lower leg rotation. Ligaments with solid consistent endpoints including ACL, PCL, LCL, MCL.  painful patellar compression. Patellar glide with moderate crepitus. Patellar and quadriceps tendons unremarkable. Hamstring and quadriceps strength is normal.  Seem stable from previous exam Contralateral knee has replacement. Mild bruising noted on the lateral aspect.   Impression and Recommendations:     This case required medical decision making of moderate complexity.

## 2016-03-25 NOTE — Assessment & Plan Note (Signed)
Patient does have some mild exacerbation likely patient is compensating for the bruising on the contralateral leg. If no significant improvement in 2 weeks we can repeat injections again. Patient may also be a candidate for viscous supplementation if this continues.

## 2016-03-29 ENCOUNTER — Encounter: Payer: Self-pay | Admitting: Internal Medicine

## 2016-03-29 ENCOUNTER — Ambulatory Visit (INDEPENDENT_AMBULATORY_CARE_PROVIDER_SITE_OTHER): Payer: Medicare Other | Admitting: Internal Medicine

## 2016-03-29 VITALS — BP 124/68 | HR 64 | Ht 65.0 in | Wt 163.8 lb

## 2016-03-29 DIAGNOSIS — E785 Hyperlipidemia, unspecified: Secondary | ICD-10-CM | POA: Diagnosis not present

## 2016-03-29 MED ORDER — ATORVASTATIN CALCIUM 10 MG PO TABS: 10.0000 mg | ORAL_TABLET | Freq: Every day | ORAL | Status: DC

## 2016-03-29 NOTE — Progress Notes (Signed)
Cardiology Office Note   Date:  03/29/2016   ID:  [name], DOB 12-19-1933, MRN [id number]  PCP:  Antony Blackbird, MD  Cardiologist:   Dorris Carnes, MD   F/U of PAF    History of Present Illness: [name] [name] is a 82 y.o. female with a history of HTN and CAD  I saw her in Jan for new onset afib  She was started on Eliquis    SHe called in the interval and complained of dizziness BP was low  I recomm cutting back on atenolol  Also recomm that she stop ASA She feels better  Denies dizziness No palpitations  No CP    When she was feeling bad she stopped a lot of her medciines  This included lipitor      Outpatient Prescriptions Prior to Visit  Medication Sig Dispense Refill  . acetaminophen (TYLENOL) 500 MG tablet Take 500 mg by mouth every 6 (six) hours as needed for moderate pain.     Marland Kitchen atenolol (TENORMIN) 50 MG tablet Take 25 mg by mouth 2 (two) times daily.     Marland Kitchen gabapentin (NEURONTIN) 100 MG capsule Take 2 capsules (200 mg total) by mouth at bedtime. 60 capsule 3  . nitroGLYCERIN (NITROSTAT) 0.4 MG SL tablet Place 1 tablet (0.4 mg total) under the tongue every 5 (five) minutes as needed for chest pain. 25 tablet 3  . tiZANidine (ZANAFLEX) 2 MG tablet TAKE (1) TABLET EVERY EIGHT HOURS AS NEEDED FOR NECK PAIN 30 tablet 0  . valsartan (DIOVAN) 80 MG tablet Take 80 mg by mouth 2 (two) times daily.    . verapamil (COVERA HS) 180 MG (CO) 24 hr tablet Take 180 mg by mouth at bedtime.    . Vitamin D, Ergocalciferol, (DRISDOL) 50000 units CAPS capsule Take 1 capsule (50,000 Units total) by mouth every 7 (seven) days. 12 capsule 0  . apixaban (ELIQUIS) 5 MG TABS tablet Take 1 tablet (5 mg total) by mouth 2 (two) times daily. 60 tablet 11  . ascorbic acid (VITAMIN C) 1000 MG tablet Take 1,000 mg by mouth daily.    Marland Kitchen atorvastatin (LIPITOR) 20 MG tablet Take 20 mg by mouth daily at 6 PM.     . docusate sodium (COLACE) 100 MG capsule Take 100 mg by mouth daily.    . DULoxetine (CYMBALTA)  60 MG capsule Take 60 mg by mouth daily.    Marland Kitchen esomeprazole (NEXIUM) 40 MG capsule Take 40 mg by mouth daily at 12 noon.    . furosemide (LASIX) 20 MG tablet Take 20 mg by mouth daily.     Marland Kitchen levothyroxine (SYNTHROID, LEVOTHROID) 112 MCG tablet Take 112 mcg by mouth daily before breakfast.    . potassium chloride (K-DUR) 10 MEQ tablet Take 2 tablets (20 mEq total) by mouth daily. (Patient taking differently: Take 10 mEq by mouth daily. ) 60 tablet 11  . valACYclovir (VALTREX) 1000 MG tablet Take 1,000 mg by mouth as needed ([place]).   3   No facility-administered medications prior to visit.     Allergies:   Isosorbide dinitrate; Prednisone; Amoxicillin; Sudafed; Aspirin; Amoxicillin-pot clavulanate; Cephalexin; Percocet; Sulfa antibiotics; and Sulfonamide derivatives   Past Medical History  Diagnosis Date  . Coronary artery disease   . Hyperlipidemia   . Orthostatic hypotension   . Hypertension   . Osteoarthrosis, unspecified whether generalized or localized, unspecified site   . Family history of malignant neoplasm of gastrointestinal tract   . Allergic  rhinitis, cause unspecified   . Unspecified functional disorder of intestine   . H/O: hysterectomy   . Osteoporosis   . Anxiety and depression   . GERD (gastroesophageal reflux disease)   . Hypothyroidism   . Chronic headaches   . DJD (degenerative joint disease)     Past Surgical History  Procedure Laterality Date  . Cardiac catheterization    . Total knee arthroplasty Right   . Nasal sinus surgery    . Tonsillectomy    . Appendectomy    . Cyst removal on back    . Cervical spine surgery    . Knee surgery Left   . Abdominal hysterectomy    . Cataract extraction w/ intraocular lens implant Bilateral   . Ganglion cyst excision Bilateral      Social History:  The patient  reports that she has never smoked. She has never used smokeless tobacco. She reports that she does not drink alcohol or use illicit drugs.   Family  History:  The patient's family history includes Brain cancer in her other; Cancer in her mother; Colon cancer in her mother; Heart attack (age of onset: 42) in her father; Heart disease in her father, maternal aunt, and maternal uncle; Varicose Veins in her mother. There is no history of Pancreatic cancer, Prostate cancer, Stomach cancer, Liver disease, or Kidney disease.    ROS:  Please see the history of present illness. All other systems are reviewed and  Negative to the above problem except as noted.    PHYSICAL EXAM: VS:  BP 124/68 mmHg  Pulse 64  Ht 5\' 5"  (1.651 m)  Wt 163 lb 12.8 oz (74.299 kg)  BMI 27.26 kg/m2  GEN: Well nourished, well developed, in no acute distress HEENT: normal Neck: no JVD, carotid bruits, or masses Cardiac: RRR; no murmurs, rubs, or gallops,no edema  Respiratory:  clear to auscultation bilaterally, normal work of breathing GI: soft, nontender, nondistended, + BS  No hepatomegaly  MS: no deformity Moving all extremities   Skin: warm and dry, no rash Neuro:  Strength and sensation are intact Psych: euthymic mood, full affect   EKG:  EKG is not  ordered today.   Lipid Panel    Component Value Date/Time   CHOL 148 03/07/2014 1105   TRIG 58.0 03/07/2014 1105   TRIG 128 12/26/2006 1227   HDL 62.20 03/07/2014 1105   CHOLHDL 2 03/07/2014 1105   CHOLHDL 3.3 CALC 12/26/2006 1227   VLDL 11.6 03/07/2014 1105   LDLCALC 74 03/07/2014 1105      Wt Readings from Last 3 Encounters:  03/29/16 163 lb 12.8 oz (74.299 kg)  03/25/16 161 lb (73.029 kg)  03/11/16 164 lb (74.39 kg)      ASSESSMENT AND PLAN:  1  PAF  Pt doing better with atenolol decreased  BP is better  Keep on Eliquis    2.  HTN  Controlled    3 HL  Will get lipids from dr fulp  Pt stopped  I would recomm restarting Lipitro since she has atherosclerosis of artieries  Would start on  10 mg  F/U lipids in 2 to 6 months    Current medicines are reviewed at length with the patient today.   The patient does not have concerns regarding medicines.  F/U with me in 6 months   Signed, Dorris Carnes, MD  03/29/2016 [facility] [place], [place], [place]  [address] Phone: [phone])  [phone]; Fax: [phone]

## 2016-03-29 NOTE — Patient Instructions (Signed)
Medication Instructions:  Restart Lipitor 10 mg daily-All other medications remain the same.  Labwork: 2 months-Lipids Please do not eat or drink after midnight the night before labs are drawn  Testing/Procedures: None  Follow-Up: Your physician wants you to follow-up in: 6 months. You will receive a reminder letter in the mail two months in advance. If you don't receive a letter, please call our office to schedule the follow-up appointment.     If you need a refill on your cardiac medications before your next appointment, please call your pharmacy.

## 2016-04-05 ENCOUNTER — Other Ambulatory Visit: Payer: Self-pay | Admitting: Neurology

## 2016-04-05 NOTE — Telephone Encounter (Signed)
Last OV: 05/14/16 Next OV: 08/15/16

## 2016-04-15 ENCOUNTER — Encounter: Payer: Self-pay | Admitting: Family Medicine

## 2016-04-15 ENCOUNTER — Ambulatory Visit (INDEPENDENT_AMBULATORY_CARE_PROVIDER_SITE_OTHER): Payer: Medicare Other | Admitting: Family Medicine

## 2016-04-15 VITALS — BP 104/62 | HR 65 | Ht 65.0 in | Wt 164.0 lb

## 2016-04-15 DIAGNOSIS — M5416 Radiculopathy, lumbar region: Secondary | ICD-10-CM | POA: Diagnosis not present

## 2016-04-15 DIAGNOSIS — M1712 Unilateral primary osteoarthritis, left knee: Secondary | ICD-10-CM | POA: Diagnosis not present

## 2016-04-15 DIAGNOSIS — S7001XD Contusion of right hip, subsequent encounter: Secondary | ICD-10-CM | POA: Diagnosis not present

## 2016-04-15 NOTE — Assessment & Plan Note (Signed)
Contusion of right hand. Patient has had an significantly better but continues to be there. Patient's being on a blood thinner probably has not helped. We discussed topical anti-inflammatories with patient did not do, we discussed warm compresses to avoid a to have a heterotropic calcification in the muscle belly itself. Does appear to be improving but discussed with her that 6-12 weeks is not unusual. Patient will come back again in 3 weeks for further evaluation.

## 2016-04-15 NOTE — Progress Notes (Signed)
[name] [address] [place], [place] [address] Phone: [phone] Subjective:    CC: Left knee pain and right.hip pain f/u   [id number] [name] [name] is a 82 y.o. female coming in with complaint of left knee and right hip pain Regarding patient's right hip. Patient did have a fall and did have a large hematoma on the lateral aspect of the hip. States that the bruising seems to have improved somewhat. States that it seems to be more localized. Hurts when she rolls onto that side. States that she can even palpate in area where there is still a bruise remaining that can be uncomfortable. Patient is on a blood thinner. Denies any radiation down the legs any numbness. Denies any groin pain.  She is also complaining of left knee pain. Patient does have arthritis of the knee. Still states that she is doing relatively well. No significant pain seems to be worsening or any increase in instability.    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  Past Medical History  Diagnosis Date  . Coronary artery disease   . Hyperlipidemia   . Orthostatic hypotension   . Hypertension   . Osteoarthrosis, unspecified whether generalized or localized, unspecified site   . Family history of malignant neoplasm of gastrointestinal tract   . Allergic rhinitis, cause unspecified   . Unspecified functional disorder of intestine   . H/O: hysterectomy   . Osteoporosis   . Anxiety and depression   . GERD (gastroesophageal reflux disease)   . Hypothyroidism   . Chronic headaches   . DJD (degenerative joint disease)    Past Surgical History  Procedure Laterality Date  . Cardiac catheterization    . Total knee arthroplasty Right   . Nasal sinus surgery    . Tonsillectomy    . Appendectomy    . Cyst removal on back    . Cervical spine surgery    . Knee surgery Left   . Abdominal hysterectomy    .  Cataract extraction w/ intraocular lens implant Bilateral   . Ganglion cyst excision Bilateral    Social History   Social History  . Marital Status: Married    Spouse Name: N/A  . Number of Children: 0  . Years of Education: N/A   Occupational History  . retired    Social History Main Topics  . Smoking status: Never Smoker   . Smokeless tobacco: Never Used     Comment: was a passive smoker for 15 yrs  . Alcohol Use: No  . Drug Use: No  . Sexual Activity: Not Currently   Other Topics Concern  . None   Social History Narrative   Allergies  Allergen Reactions  . Isosorbide Dinitrate Other (See Comments)    Other reaction(s): severe headaches  . Prednisone Other (See Comments)    Heart palpitations  . Amoxicillin Swelling    Lip/throat swelling  . Sudafed [Pseudoephedrine] Other (See Comments)    Other reaction(s): rash  . Aspirin Other (See Comments)    Baby aspirin is tolerated, itching in the past  . Amoxicillin-Pot Clavulanate Swelling and Other (See Comments)    Other reaction(s): Facial and tounge swelling  . Cephalexin Other (See Comments)  . Percocet [Oxycodone-Acetaminophen] Other (See Comments)  . Sulfa Antibiotics Other (See Comments)  . Sulfonamide Derivatives Other (See Comments)    Unknown reaction a long time ago  Family History  Problem Relation Age of Onset  . Brain cancer Other     1st degree relative  . Heart disease Maternal Uncle     x 4  . Colon cancer Mother   . Cancer Mother   . Varicose Veins Mother   . Heart attack Father 40  . Heart disease Father   . Heart disease Maternal Aunt     x 1  . Pancreatic cancer Neg Hx   . Prostate cancer Neg Hx   . Stomach cancer Neg Hx     pt notes that mother says she had "part of her stomach removed"  . Liver disease Neg Hx   . Kidney disease Neg Hx     Past medical history, social, surgical and family history all reviewed in electronic medical record.  No pertanent information unless stated  regarding to the chief complaint.   Review of Systems: No headache, visual changes, nausea, vomiting, diarrhea, constipation, dizziness, abdominal pain, skin rash, fevers, chills, night sweats, weight loss, swollen lymph nodes, body aches, joint swelling, muscle aches, chest pain, shortness of breath, mood changes.   Objective Blood pressure 104/62, pulse 65, height 5\' 5"  (1.651 m), weight 164 lb (74.39 kg), SpO2 97 %.  General: No apparent distress alert and oriented x3 mood and affect normal, dressed appropriately.  HEENT: Pupils equal, extraocular movements intact  Respiratory: Patient's speak in full sentences and does not appear short of breath  Cardiovascular: No lower extremity edema, non tender, no erythema  Skin: Warm dry intact with no signs of infection or rash on extremities or on axial skeleton.  Abdomen: Soft nontender  Neuro: Cranial nerves II through XII are intact, neurovascularly intact in all extremities with 2+ DTRs and 2+ pulses.  Lymph: No lymphadenopathy of posterior or anterior cervical chain or axillae bilaterally.  Gait Very mild antalgic gait which is new from previous exam. MSK:  Non tender with full range of motion and good stability and symmetric strength and tone of shoulders, elbows, wrist, , and ankles bilaterally. Significant arthritic changes of the hands and other joints. Patient does have anemia placement on the right side Right hip shows the patient does have some mild limitation with internal range of motion but no groin pain. On inspection patient does have a significant decrease in bruits signs down to cord or size. Patient though does have what appears to be some subcutaneous thickening noted in the area. Tender to palpation..  Knee: Left I patient does have some mild instability of the knee with valgus force. Mild to moderate tenderness it seems stable ROM full in flexion and extension and lower leg rotation. Ligaments with solid consistent endpoints  including ACL, PCL, LCL, MCL.  painful patellar compression. Patellar glide with moderate crepitus. Patellar and quadriceps tendons unremarkable. Hamstring and quadriceps strength is normal.  Contralateral knee has replacement.    Impression and Recommendations:     This case required medical decision making of moderate complexity.

## 2016-04-15 NOTE — Progress Notes (Signed)
Pre visit review using our clinic review tool, if applicable. No additional management support is needed unless otherwise documented below in the visit note. 

## 2016-04-15 NOTE — Assessment & Plan Note (Signed)
Continues to have knee pain but overall still better since her injection. Once to continue to monitor. Follow-up as needed. We discussed custom bracing which patient declined. Unable to do formal physical therapy secondary to her being the primary caregiver for her ailing husband.

## 2016-04-15 NOTE — Assessment & Plan Note (Signed)
Patient does not have any radicular symptoms at this time. Known degenerative disc disease at L1-L2. Worsening symptoms we'll need to consider further workup.

## 2016-04-15 NOTE — Patient Instructions (Signed)
Good to see you.  Heat pack or warm compress 10 minutes 2 to 3 times a day at least  Tennis ball or massager to the side of the hip once daily.  Continue the vitamin D Arnica lotion 2 times daily See me again in 3 weeks and if not better then we will inject to break up the bruise.

## 2016-04-26 ENCOUNTER — Telehealth: Payer: Self-pay

## 2016-04-26 NOTE — Telephone Encounter (Signed)
Error

## 2016-04-30 ENCOUNTER — Ambulatory Visit (INDEPENDENT_AMBULATORY_CARE_PROVIDER_SITE_OTHER): Payer: Medicare Other | Admitting: Family Medicine

## 2016-04-30 ENCOUNTER — Encounter: Payer: Self-pay | Admitting: Family Medicine

## 2016-04-30 VITALS — BP 100/70 | HR 59 | Ht 65.0 in | Wt 164.0 lb

## 2016-04-30 DIAGNOSIS — M5416 Radiculopathy, lumbar region: Secondary | ICD-10-CM

## 2016-04-30 DIAGNOSIS — M1712 Unilateral primary osteoarthritis, left knee: Secondary | ICD-10-CM

## 2016-04-30 MED ORDER — GABAPENTIN 300 MG PO CAPS: 300.0000 mg | ORAL_CAPSULE | Freq: Every day | ORAL | Status: DC

## 2016-04-30 MED ORDER — TIZANIDINE HCL 2 MG PO TABS: ORAL_TABLET | ORAL | Status: DC

## 2016-04-30 MED ORDER — METHYLPREDNISOLONE ACETATE 80 MG/ML IJ SUSP
80.0000 mg | Freq: Once | INTRAMUSCULAR | Status: AC
  Administered 2016-04-30: 80 mg via INTRAMUSCULAR

## 2016-04-30 NOTE — Assessment & Plan Note (Signed)
Having recurrent 17 lumbar radiculopathy. Given an injection of corticosteroid today. Patient will not take oral steroids. We discussed icing regimen. Given low dose muscle relaxer. Patient and will come back and see me again in 10-14 days to see if she is improving. If worsening symptoms advance imaging would be warranted. Has responded to epidural steroid injections in the past.

## 2016-04-30 NOTE — Assessment & Plan Note (Signed)
Patient given injection today and tolerated the procedure well. We discussed icing regimen. Discussed continuing the home exercises. Patient will continue the once weekly vitamin D supplementation. Patient come back in 10-14 days. If worsening symptoms she could be a candidate for viscous supple mentation.

## 2016-04-30 NOTE — Progress Notes (Signed)
Pre visit review using our clinic review tool, if applicable. No additional management support is needed unless otherwise documented below in the visit note. 

## 2016-04-30 NOTE — Patient Instructions (Signed)
Good to see you  Injected your knee today and I hope it helps Continue to stay active Injection to help your back as well.  Ice 20 minutes 2 times daily. Usually after activity and before bed. Gabapentin up to 300mg  at night  Zanaflex if you need something else up to 3 times a day but be at home because it can make you sleepy See me again in 10-14 days.

## 2016-04-30 NOTE — Progress Notes (Signed)
[name] [address] [place], [place] [address] Phone: [phone] Subjective:    [id number] low back pain and left knee pain   [id number] [name] [name] is a 82 y.o. female coming in with complaint of left knee and new back pain  Patient does have severe arthritis of the upper lumbar spines previously. These were individually visualized by me on x-rays previously. Patient states that she is having more exacerbation of low back pain. Seems to be tighter. Does not remember any true injury. Having radiation down the leg with some mild weakness. Denies any bowel or bladder incontinence. Is able to ambulate. Still able to do daily activities but is having enough pain that sometimes he can catch her breath. Patient is concerned that at some point one of these events is going to cause her to fall.  She is also complaining of left knee pain. Has known degenerative joint disease. Moderate to severe in nature. Last injection was 3 months ago. Started having worsening discomfort again. States that this didn't seem to be some swelling. Rates the severity of 6 out of 10. Mild increase in instability recently.    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  Past Medical History  Diagnosis Date  . Coronary artery disease   . Hyperlipidemia   . Orthostatic hypotension   . Hypertension   . Osteoarthrosis, unspecified whether generalized or localized, unspecified site   . Family history of malignant neoplasm of gastrointestinal tract   . Allergic rhinitis, cause unspecified   . Unspecified functional disorder of intestine   . H/O: hysterectomy   . Osteoporosis   . Anxiety and depression   . GERD (gastroesophageal reflux disease)   . Hypothyroidism   . Chronic headaches   . DJD (degenerative joint disease)    Past Surgical History  Procedure Laterality Date  . Cardiac catheterization    .  Total knee arthroplasty Right   . Nasal sinus surgery    . Tonsillectomy    . Appendectomy    . Cyst removal on back    . Cervical spine surgery    . Knee surgery Left   . Abdominal hysterectomy    . Cataract extraction w/ intraocular lens implant Bilateral   . Ganglion cyst excision Bilateral    Social History   Social History  . Marital Status: Married    Spouse Name: N/A  . Number of Children: 0  . Years of Education: N/A   Occupational History  . retired    Social History Main Topics  . Smoking status: Never Smoker   . Smokeless tobacco: Never Used     Comment: was a passive smoker for 15 yrs  . Alcohol Use: No  . Drug Use: No  . Sexual Activity: Not Currently   Other Topics Concern  . None   Social History Narrative   Allergies  Allergen Reactions  . Isosorbide Dinitrate Other (See Comments)    Other reaction(s): severe headaches  . Prednisone Other (See Comments)    Heart palpitations  . Amoxicillin Swelling    Lip/throat swelling  . Sudafed [Pseudoephedrine] Other (See Comments)    Other reaction(s): rash  . Aspirin Other (See Comments)    Baby aspirin is tolerated, itching in the past  . Amoxicillin-Pot Clavulanate Swelling and Other (See Comments)    Other reaction(s): Facial and tounge swelling  . Cephalexin Other (See Comments)  .  Percocet [Oxycodone-Acetaminophen] Other (See Comments)  . Sulfa Antibiotics Other (See Comments)  . Sulfonamide Derivatives Other (See Comments)    Unknown reaction a long time ago   Family History  Problem Relation Age of Onset  . Brain cancer Other     1st degree relative  . Heart disease Maternal Uncle     x 4  . Colon cancer Mother   . Cancer Mother   . Varicose Veins Mother   . Heart attack Father 16  . Heart disease Father   . Heart disease Maternal Aunt     x 1  . Pancreatic cancer Neg Hx   . Prostate cancer Neg Hx   . Stomach cancer Neg Hx     pt notes that mother says she had "part of her stomach  removed"  . Liver disease Neg Hx   . Kidney disease Neg Hx     Past medical history, social, surgical and family history all reviewed in electronic medical record.  No pertanent information unless stated regarding to the chief complaint.   Review of Systems: No headache, visual changes, nausea, vomiting, diarrhea, constipation, dizziness, abdominal pain, skin rash, fevers, chills, night sweats, weight loss, swollen lymph nodes, body aches, joint swelling, muscle aches, chest pain, shortness of breath, mood changes.   Objective Blood pressure 100/70, pulse 59, height 5\' 5"  (1.651 m), weight 164 lb (74.39 kg), SpO2 94 %.  General: No apparent distress alert and oriented x3 mood and affect normal, dressed appropriately.  HEENT: Pupils equal, extraocular movements intact  Respiratory: Patient's speak in full sentences and does not appear short of breath  Cardiovascular: No lower extremity edema, non tender, no erythema  Skin: Warm dry intact with no signs of infection or rash on extremities or on axial skeleton.  Abdomen: Soft nontender  Neuro: Cranial nerves II through XII are intact, neurovascularly intact in all extremities with 2+ DTRs and 2+ pulses.  Lymph: No lymphadenopathy of posterior or anterior cervical chain or axillae bilaterally.  Gait normal with good balance and coordination.  MSK:  Non tender with full range of motion and good stability and symmetric strength and tone of shoulders, elbows, wrist, , and ankles bilaterally. Significant arthritic changes of the hands and other joints. Patient does have anemia placement on the right side Back Exam:  Inspection: Mild increase in kyphosis Motion: Flexion 45 deg, Extension 45 deg, Side Bending to 45 deg bilaterally,  Rotation to 45 deg bilaterally  SLR laying: Negative  XSLR laying: Negative  Palpable tenderness: Severe tenderness and tightness of the paraspinal musculature of the lumbar spine bilaterally. Mild spinous process  discomfort but not pain. FABER: Tight bilaterally Sensory change: Gross sensation intact to all lumbar and sacral dermatomes.  Reflexes: 2+ at both patellar tendons, 2+ at achilles tendons, Babinski's downgoing.  Strength at foot  Plantar-flexion: 5/5 Dorsi-flexion: 5/5 Eversion: 5/5 Inversion: 5/5  Leg strength  Quad: 5/5 Hamstring: 5/5 Hip flexor: 5/5 Hip abductors: 4/5  Gait unremarkable.   Knee: Left Instability noted with valgus force. Patient does have a fairly large thigh to calf ratio. Mild to moderate tenderness  ROM full in flexion and extension and lower leg rotation. Ligaments with solid consistent endpoints including ACL, PCL, LCL, MCL.  painful patellar compression. Patellar glide with moderate crepitus. Patellar and quadriceps tendons unremarkable. Hamstring and quadriceps strength is normal.  Contralateral knee is a replacement  After informed written and verbal consent, patient was seated on exam table. Left knee was prepped with  alcohol swab and utilizing anterolateral approach, patient's left knee space was injected with 4:1  marcaine 0.5%: Kenalog 40mg /dL. Patient tolerated the procedure well without immediate complications.   Impression and Recommendations:     This case required medical decision making of moderate complexity.

## 2016-04-30 NOTE — Addendum Note (Signed)
Addended by: Douglass Rivers T on: 04/30/2016 10:29 AM   Modules accepted: Orders

## 2016-05-08 ENCOUNTER — Ambulatory Visit: Payer: Medicare Other | Admitting: Family Medicine

## 2016-05-14 ENCOUNTER — Ambulatory Visit: Payer: Medicare Other | Admitting: Neurology

## 2016-05-14 ENCOUNTER — Ambulatory Visit: Payer: Medicare Other | Admitting: Family Medicine

## 2016-05-16 ENCOUNTER — Other Ambulatory Visit: Payer: Self-pay

## 2016-05-16 ENCOUNTER — Encounter: Payer: Self-pay | Admitting: Family Medicine

## 2016-05-16 ENCOUNTER — Ambulatory Visit (INDEPENDENT_AMBULATORY_CARE_PROVIDER_SITE_OTHER): Payer: Medicare Other | Admitting: Family Medicine

## 2016-05-16 VITALS — BP 120/68 | HR 56 | Ht 65.0 in | Wt 162.0 lb

## 2016-05-16 DIAGNOSIS — M7061 Trochanteric bursitis, right hip: Secondary | ICD-10-CM | POA: Diagnosis not present

## 2016-05-16 DIAGNOSIS — M5416 Radiculopathy, lumbar region: Secondary | ICD-10-CM

## 2016-05-16 DIAGNOSIS — M25551 Pain in right hip: Secondary | ICD-10-CM | POA: Diagnosis not present

## 2016-05-16 DIAGNOSIS — M1712 Unilateral primary osteoarthritis, left knee: Secondary | ICD-10-CM

## 2016-05-16 NOTE — Assessment & Plan Note (Signed)
Patient given injection and tolerated the procedure well with complete resolution of pain. Encourage patient to start home exercises on a more regular basis. We discussed icing regimen. Patient given trial topical anti-inflammatories the patient does have significant allergies. Patient come back and see me again in 4-6 weeks for further evaluation and treatment.

## 2016-05-16 NOTE — Patient Instructions (Signed)
Sorry for the delay.  ICe before bed We will continue to monitor the back and the knee.  Keep doing what you do and try to do the exercises as you can  Can repeat the knee again in 2 months if you want  If back gets worse we will need MRI.  See me again in 2 months.

## 2016-05-16 NOTE — Assessment & Plan Note (Signed)
Patient doing better after injection. Once to continue with conservative therapy. Does not want the custom brace at this time. Patient would be a candidate for viscous supplementation if needed. Otherwise can repeat steroid injection in another 2 months.

## 2016-05-16 NOTE — Assessment & Plan Note (Signed)
Less radicular symptom than previous exams less back pain. Encourage her to continue same regimen. Follow-up in 4-6 weeks. Decline formal physical therapy.

## 2016-05-16 NOTE — Progress Notes (Signed)
[name] [address], [place] [address] Phone: [phone] Subjective:    [id number] of low back pain and left knee pain  [id number] [name] [name] is a 82 y.o. 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 These were individually visualized by me on x-rays.patient was to continue with conservative therapy including home exercises, icing protocol as well as vitamin D supplementation. Patient is on gabapentin. Patient states she is doing better at this time. Patient states that it seems to be bearable. The worsening. Mild radiation down the legs but very minimal compared to what it was previously.  She is also complaining of left knee pain. Has known degenerative joint disease. Moderate to severe in nature.patient was given an injection 4 weeks ago into the left knee. Patient statesdoing 90% better. Very minimal pain. No locking or giving out on her at this time. Once to continue with conservative therapy  Patient is now coming in with right hip pain. Had a history of a contusion on the side within the last multiple months. Also has had a history of greater trochanteric bursitis.patient was given an injection in December for this problem. Patient statessame problem again. Worsening. Pain bad enough it is waking her up at night and affecting daily activities. Worse after sitting a long amount time or first steps in the morning.    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  Past Medical History  Diagnosis Date  . Coronary artery disease   . Hyperlipidemia   . Orthostatic hypotension   . Hypertension   . Osteoarthrosis, unspecified whether generalized or localized, unspecified site   . Family history of malignant neoplasm of gastrointestinal tract   . Allergic rhinitis, cause  unspecified   . Unspecified functional disorder of intestine   . H/O: hysterectomy   . Osteoporosis   . Anxiety and depression   . GERD (gastroesophageal reflux disease)   . Hypothyroidism   . Chronic headaches   . DJD (degenerative joint disease)    Past Surgical History  Procedure Laterality Date  . Cardiac catheterization    . Total knee arthroplasty Right   . Nasal sinus surgery    . Tonsillectomy    . Appendectomy    . Cyst removal on back    . Cervical spine surgery    . Knee surgery Left   . Abdominal hysterectomy    . Cataract extraction w/ intraocular lens implant Bilateral   . Ganglion cyst excision Bilateral    Social History   Social History  . Marital Status: Married    Spouse Name: N/A  . Number of Children: 0  . Years of Education: N/A   Occupational History  . retired    Social History Main Topics  . Smoking status: Never Smoker   . Smokeless tobacco: Never Used     Comment: was a passive smoker for 15 yrs  . Alcohol Use: No  . Drug Use: No  . Sexual Activity: Not Currently   Other Topics Concern  . None   Social History Narrative   Allergies  Allergen Reactions  . Isosorbide Dinitrate Other (See Comments)    Other reaction(s): severe headaches  . Prednisone Other (See Comments)    Heart palpitations  . Amoxicillin Swelling    Lip/throat swelling  . Sudafed [Pseudoephedrine]  Other (See Comments)    Other reaction(s): rash  . Aspirin Other (See Comments)    Baby aspirin is tolerated, itching in the past  . Amoxicillin-Pot Clavulanate Swelling and Other (See Comments)    Other reaction(s): Facial and tounge swelling  . Cephalexin Other (See Comments)  . Percocet [Oxycodone-Acetaminophen] Other (See Comments)  . Sulfa Antibiotics Other (See Comments)  . Sulfonamide Derivatives Other (See Comments)    Unknown reaction a long time ago   Family History  Problem Relation Age of Onset  . Brain cancer Other     1st degree relative  .  Heart disease Maternal Uncle     x 4  . Colon cancer Mother   . Cancer Mother   . Varicose Veins Mother   . Heart attack Father 70  . Heart disease Father   . Heart disease Maternal Aunt     x 1  . Pancreatic cancer Neg Hx   . Prostate cancer Neg Hx   . Stomach cancer Neg Hx     pt notes that mother says she had "part of her stomach removed"  . Liver disease Neg Hx   . Kidney disease Neg Hx     Past medical history, social, surgical and family history all reviewed in electronic medical record.  No pertanent information unless stated regarding to the chief complaint.   Review of Systems: No headache, visual changes, nausea, vomiting, diarrhea, constipation, dizziness, abdominal pain, skin rash, fevers, chills, night sweats, weight loss, swollen lymph nodes, body aches, joint swelling, muscle aches, chest pain, shortness of breath, mood changes.   Objective Blood pressure 120/68, pulse 56, height 5\' 5"  (1.651 m), weight 162 lb (73.483 kg), SpO2 95 %.  General: No apparent distress alert and oriented x3 mood and affect normal, dressed appropriately.  HEENT: Pupils equal, extraocular movements intact  Respiratory: Patient's speak in full sentences and does not appear short of breath  Cardiovascular: No lower extremity edema, non tender, no erythema  Skin: Warm dry intact with no signs of infection or rash on extremities or on axial skeleton.  Abdomen: Soft nontender  Neuro: Cranial nerves II through XII are intact, neurovascularly intact in all extremities with 2+ DTRs and 2+ pulses.  Lymph: No lymphadenopathy of posterior or anterior cervical chain or axillae bilaterally.  Gait normal with good balance and coordination.  MSK:  Non tender with full range of motion and good stability and symmetric strength and tone of shoulders, elbows, wrist, , and ankles bilaterally. Significant arthritic changes of the hands and other joints. Patient does have anemia placement on the right side Back  Exam:  Inspection: Mild increase in kyphosis Motion: Flexion 45 deg, Extension 25 deg, Side Bending to 45 deg bilaterally,  Rotation to 35 deg bilaterally  SLR laying: Negative  XSLR laying: Negative  Palpable tenderness: minimal tenderness to palpation in the paraspinal musculature lumbar spine. This is an improvement patient also severely tender over the right greater trochanteric area FABER: Tight bilaterally Sensory change: Gross sensation intact to all lumbar and sacral dermatomes.  Reflexes: 2+ at both patellar tendons, 2+ at achilles tendons, Babinski's downgoing.  Strength at foot  Plantar-flexion: 5/5 Dorsi-flexion: 5/5 Eversion: 5/5 Inversion: 5/5  Leg strength  Quad: 5/5 Hamstring: 5/5 Hip flexor: 5/5 Hip abductors: 4/5  Gait unremarkable.   Knee: Left Instability noted with valgus force. Patient does have a fairly large thigh to calf ratio. Minimal tenderness over the medial joint line but improved ROM full in flexion  and extension and lower leg rotation. Ligaments with solid consistent endpoints including ACL, PCL, LCL, MCL.  painful patellar compression. Patellar glide with mildcrepitus. Patellar and quadriceps tendons unremarkable. Hamstring and quadriceps strength is normal.  Contralateral knee is a replacement   Procedure: Real-time Ultrasound Guided Injection of right greater trochanteric bursitis secondary to patient's body habitus Device: GE Logiq E  Ultrasound guided injection is preferred based studies that show increased duration, increased effect, greater accuracy, decreased procedural pain, increased response rate, and decreased cost with ultrasound guided versus blind injection.  Verbal informed consent obtained.  Time-out conducted.  Noted no overlying erythema, induration, or other signs of local infection.  Skin prepped in a sterile fashion.  Local anesthesia: Topical Ethyl chloride.  With sterile technique and under real time ultrasound guidance:   Greater trochanteric area was visualized and patient's bursa was noted. A 22-gauge 3 inch needle was inserted and 4 cc of 0.5% Marcaine and 1 cc of Kenalog 40 mg/dL was injected. Pictures taken Completed without difficulty  Pain immediately resolved suggesting accurate placement of the medication.  Advised to call if fevers/chills, erythema, induration, drainage, or persistent bleeding.  Images permanently stored and available for review in the ultrasound unit.  Impression: Technically successful ultrasound guided injection.   Impression and Recommendations:     This case required medical decision making of moderate complexity.

## 2016-05-16 NOTE — Progress Notes (Signed)
Pre visit review using our clinic review tool, if applicable. No additional management support is needed unless otherwise documented below in the visit note. 

## 2016-05-17 DIAGNOSIS — S61215A Laceration without foreign body of left ring finger without damage to nail, initial encounter: Secondary | ICD-10-CM | POA: Diagnosis not present

## 2016-05-29 ENCOUNTER — Other Ambulatory Visit (INDEPENDENT_AMBULATORY_CARE_PROVIDER_SITE_OTHER): Payer: Medicare Other | Admitting: *Deleted

## 2016-05-29 DIAGNOSIS — E785 Hyperlipidemia, unspecified: Secondary | ICD-10-CM

## 2016-05-29 LAB — LIPID PANEL
CHOLESTEROL: 227 mg/dL — AB (ref 125–200)
HDL: 82 mg/dL (ref >=46)
LDL Cholesterol: 126 mg/dL (ref <130)
TRIGLYCERIDES: 97 mg/dL (ref <150)
Total CHOL/HDL Ratio: 2.8 Ratio (ref <=5.0)
VLDL: 19 mg/dL (ref <30)

## 2016-06-03 ENCOUNTER — Ambulatory Visit (INDEPENDENT_AMBULATORY_CARE_PROVIDER_SITE_OTHER): Payer: Medicare Other | Admitting: Family Medicine

## 2016-06-03 ENCOUNTER — Encounter: Payer: Self-pay | Admitting: Family Medicine

## 2016-06-03 VITALS — BP 128/84 | HR 57 | Ht 65.0 in | Wt 162.0 lb

## 2016-06-03 DIAGNOSIS — M5416 Radiculopathy, lumbar region: Secondary | ICD-10-CM | POA: Diagnosis not present

## 2016-06-03 MED ORDER — METHYLPREDNISOLONE ACETATE 80 MG/ML IJ SUSP
80.0000 mg | Freq: Once | INTRAMUSCULAR | Status: AC
  Administered 2016-06-03: 80 mg via INTRAMUSCULAR

## 2016-06-03 NOTE — Progress Notes (Signed)
[name] [address], [place] [address] Phone: [phone] Subjective:    [id number] of low back pain Now with right leg weakness.  [id number] [name] [name] is a 82 y.o. 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 Patient is complaining of intermittent radicular symptoms down the leg previously. Patient was found to have more of a greater trochanteric bursitis 3 weeks ago and was given an injection. Patient states Injection helped for some of the sign pain. Since then the pain as well as different. In going from the posterior aspect radiating down her leg. Patient states this seems to be very similar to her back pain. Patient states certain movements can cause a sharp burning sensation going down the posterior lateral aspect the leg. Patient denies numbness or weakness. States though that the pain can stop her from doing different activities. Rates it as 9 out of 10. Continues to gabapentin with no significant improvement. Difficult finding a comfortable position at night.       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  Past Medical History  Diagnosis Date  . Coronary artery disease   . Hyperlipidemia   . Orthostatic hypotension   . Hypertension   . Osteoarthrosis, unspecified whether generalized or localized, unspecified site   . Family history of malignant neoplasm of gastrointestinal tract   . Allergic rhinitis, cause unspecified   . Unspecified functional disorder of intestine   . H/O: hysterectomy   . Osteoporosis   . Anxiety and depression   . GERD (gastroesophageal reflux disease)   . Hypothyroidism   . Chronic headaches   . DJD (degenerative joint disease)    Past Surgical History  Procedure Laterality Date  . Cardiac catheterization    . Total knee  arthroplasty Right   . Nasal sinus surgery    . Tonsillectomy    . Appendectomy    . Cyst removal on back    . Cervical spine surgery    . Knee surgery Left   . Abdominal hysterectomy    . Cataract extraction w/ intraocular lens implant Bilateral   . Ganglion cyst excision Bilateral    Social History   Social History  . Marital Status: Married    Spouse Name: N/A  . Number of Children: 0  . Years of Education: N/A   Occupational History  . retired    Social History Main Topics  . Smoking status: Never Smoker   . Smokeless tobacco: Never Used     Comment: was a passive smoker for 15 yrs  . Alcohol Use: No  . Drug Use: No  . Sexual Activity: Not Currently   Other Topics Concern  . None   Social History Narrative   Allergies  Allergen Reactions  . Isosorbide Dinitrate Other (See Comments)    Other reaction(s): severe headaches  . Prednisone Other (See Comments)    Heart palpitations  . Amoxicillin Swelling    Lip/throat swelling  . Sudafed [Pseudoephedrine] Other (See Comments)    Other reaction(s): rash  . Aspirin Other (See Comments)    Baby aspirin is tolerated, itching in the past  . Amoxicillin-Pot Clavulanate Swelling and Other (See Comments)    Other reaction(s): Facial and tounge swelling  . Cephalexin Other (See Comments)  . Percocet [Oxycodone-Acetaminophen] Other (See Comments)  .  Sulfa Antibiotics Other (See Comments)  . Sulfonamide Derivatives Other (See Comments)    Unknown reaction a long time ago   Family History  Problem Relation Age of Onset  . Brain cancer Other     1st degree relative  . Heart disease Maternal Uncle     x 4  . Colon cancer Mother   . Cancer Mother   . Varicose Veins Mother   . Heart attack Father 75  . Heart disease Father   . Heart disease Maternal Aunt     x 1  . Pancreatic cancer Neg Hx   . Prostate cancer Neg Hx   . Stomach cancer Neg Hx     pt notes that mother says she had "part of her stomach removed"  .  Liver disease Neg Hx   . Kidney disease Neg Hx     Past medical history, social, surgical and family history all reviewed in electronic medical record.  No pertanent information unless stated regarding to the chief complaint.   Review of Systems: No headache, visual changes, nausea, vomiting, diarrhea, constipation, dizziness, abdominal pain, skin rash, fevers, chills, night sweats, weight loss, swollen lymph nodes, body aches, joint swelling, muscle aches, chest pain, shortness of breath, mood changes.   Objective Blood pressure 128/84, pulse 57, height 5\' 5"  (1.651 m), weight 162 lb (73.483 kg), SpO2 98 %.  General: No apparent distress alert and oriented x3 mood and affect normal, dressed appropriately.  HEENT: Pupils equal, extraocular movements intact  Respiratory: Patient's speak in full sentences and does not appear short of breath  Cardiovascular: No lower extremity edema, non tender, no erythema  Skin: Warm dry intact with no signs of infection or rash on extremities or on axial skeleton.  Abdomen: Soft nontender  Neuro: Cranial nerves II through XII are intact, neurovascularly intact in all extremities with 2+ DTRs and 2+ pulses.  Lymph: No lymphadenopathy of posterior or anterior cervical chain or axillae bilaterally.  Gait normal with good balance and coordination.  MSK:  Non tender with full range of motion and good stability and symmetric strength and tone of shoulders, elbows, wrist, , and ankles bilaterally. Significant arthritic changes of the hands and other joints.  Back Exam:  Inspection: Mild increase in kyphosis Motion: Flexion 35 deg, Extension 25 deg, Side Bending to 45 deg bilaterally,  Rotation to 35 deg bilaterally  SLR laying: Positive straight leg test XSLR laying: Negative  Palpable tenderness: Decrease in tenderness in the paraspinal musculature of the lumbar spine on the right side. Pain over the right sacroiliac joint as well. FABER: Tight  bilaterally Sensory change: Gross worse on the right side intact to all lumbar and sacral dermatomes.  Reflexes: 2+ at both patellar tendons, 2+ at achilles tendons, Babinski's downgoing.  Strength at foot  Plantar-flexion: 5/5 Dorsi-flexion: 5/5 Eversion: 5/5 Inversion: 5/5  Leg strength  Quad: 5/5 Hamstring: 5/5 Hip flexor: 5/5 Hip abductors: 4/5  Gait unremarkable.      Impression and Recommendations:     This case required medical decision making of moderate complexity.

## 2016-06-03 NOTE — Patient Instructions (Addendum)
Good to see you  I am sorry you are hurting We will give you an injection today to see if it helps Try to get the MRI. The last one I have is 2012.  If I can see the MRI then we can see what level an injection could help Ice is your friend Send me a message or call me in 48 hours and if not better we will consider injection If worsening symptoms please go to the emergency room.

## 2016-06-03 NOTE — Assessment & Plan Note (Addendum)
Patient having much more of a lumbar radiculopathy. Positive straight leg test today. Tenderness in the [facility] palmar musculature of the lumbar spine significant doing more than the lateral aspect of the hip. Patient has known degenerative disc disease and did show progression on last x-rays. Patient states that she had an MRI within the last several months from an outside facility and will try to get this so we do not repeat. Patient has had an injection previously with no significant improvement in the epidural region she states. Does not remember what level. I do want to see MRI for myself and see if any other injection would be beneficial. We did discuss with patient being on the blood thinner that this may take some different management. Patient will continue all medications. Given a shot of Depo-Medrol. Unable to do prednisone secondary to heart palpitations. Discussed with patient if worsening symptoms to seek medical attention immediately. Spent  25 minutes with patient face-to-face and had greater than 50% of counseling including as described above in assessment and plan.

## 2016-06-03 NOTE — Progress Notes (Signed)
Pre visit review using our clinic review tool, if applicable. No additional management support is needed unless otherwise documented below in the visit note. 

## 2016-06-04 ENCOUNTER — Telehealth: Payer: Self-pay | Admitting: Family Medicine

## 2016-06-04 NOTE — Telephone Encounter (Signed)
Patient called to advise that she expects to have the copy of her MRI that you are needing by tomorrow. At that point, she will come and drop it off

## 2016-06-05 ENCOUNTER — Other Ambulatory Visit: Payer: Self-pay | Admitting: *Deleted

## 2016-06-05 DIAGNOSIS — E785 Hyperlipidemia, unspecified: Secondary | ICD-10-CM

## 2016-06-05 MED ORDER — ATORVASTATIN CALCIUM 20 MG PO TABS: 20.0000 mg | ORAL_TABLET | Freq: Every day | ORAL | Status: DC

## 2016-07-16 ENCOUNTER — Encounter: Payer: Self-pay | Admitting: Family Medicine

## 2016-07-16 ENCOUNTER — Ambulatory Visit (INDEPENDENT_AMBULATORY_CARE_PROVIDER_SITE_OTHER): Payer: Medicare Other | Admitting: Family Medicine

## 2016-07-16 VITALS — BP 120/80 | HR 58 | Wt 156.0 lb

## 2016-07-16 DIAGNOSIS — M5416 Radiculopathy, lumbar region: Secondary | ICD-10-CM

## 2016-07-16 NOTE — Progress Notes (Signed)
[name] [address] [place], [place] [address] Phone: [phone] Subjective:    [id number] of low back pain Now with right leg weakness.  [id number] [name] [name] is a 82 y.o. 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 Patient is complaining of intermittent radicular symptoms down the leg previously. Continues to have some. Filled 2 weeks ago. Patient states that this was . Simpson and some bruising on the lateral aspect of her hip. States that she continues to have some mild weakness of the leg but overall thinks he is near her baseline. Feels like she is doing okay. She knows if she changes certain way she does activity she does not have as much pain. States that the weakness is not worsening. Still able to do daily activities. 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  Past Medical History  Diagnosis Date  . Coronary artery disease   . Hyperlipidemia   . Orthostatic hypotension   . Hypertension   . Osteoarthrosis, unspecified whether generalized or localized, unspecified site   . Family history of malignant neoplasm of gastrointestinal tract   . Allergic rhinitis, cause unspecified   . Unspecified functional disorder of intestine   . H/O: hysterectomy   . Osteoporosis   . Anxiety and depression   . GERD (gastroesophageal reflux disease)   . Hypothyroidism   . Chronic headaches   . DJD (degenerative joint disease)    Past Surgical History  Procedure Laterality Date  . Cardiac catheterization    . Total knee arthroplasty Right   . Nasal sinus surgery    . Tonsillectomy    . Appendectomy    . Cyst removal on back    . Cervical spine surgery    . Knee surgery Left   . Abdominal  hysterectomy    . Cataract extraction w/ intraocular lens implant Bilateral   . Ganglion cyst excision Bilateral    Social History   Social History  . Marital Status: Married    Spouse Name: N/A  . Number of Children: 0  . Years of Education: N/A   Occupational History  . retired    Social History Main Topics  . Smoking status: Never Smoker   . Smokeless tobacco: Never Used     Comment: was a passive smoker for 15 yrs  . Alcohol Use: No  . Drug Use: No  . Sexual Activity: Not Currently   Other Topics Concern  . None   Social History Narrative   Allergies  Allergen Reactions  . Isosorbide Dinitrate Other (See Comments)    Other reaction(s): severe headaches  . Prednisone Other (See Comments)    Heart palpitations  . Amoxicillin Swelling    Lip/throat swelling  . Sudafed [Pseudoephedrine] Other (See Comments)    Other reaction(s): rash  . Aspirin Other (See Comments)    Baby aspirin is tolerated, itching in the past  . Amoxicillin-Pot Clavulanate Swelling and Other (See Comments)    Other reaction(s): Facial and tounge swelling  . Cephalexin Other (See Comments)  . Percocet [Oxycodone-Acetaminophen] Other (See Comments)  . Sulfa Antibiotics Other (See Comments)  . Sulfonamide Derivatives Other (See Comments)    Unknown reaction a long time ago   Family  History  Problem Relation Age of Onset  . Brain cancer Other     1st degree relative  . Heart disease Maternal Uncle     x 4  . Colon cancer Mother   . Cancer Mother   . Varicose Veins Mother   . Heart attack Father 66  . Heart disease Father   . Heart disease Maternal Aunt     x 1  . Pancreatic cancer Neg Hx   . Prostate cancer Neg Hx   . Stomach cancer Neg Hx     pt notes that mother says she had "part of her stomach removed"  . Liver disease Neg Hx   . Kidney disease Neg Hx     Past medical history, social, surgical and family history all reviewed in electronic medical record.  No pertanent  information unless stated regarding to the chief complaint.   Review of Systems: No headache, visual changes, nausea, vomiting, diarrhea, constipation, dizziness, abdominal pain, skin rash, fevers, chills, night sweats, weight loss, swollen lymph nodes, body aches, joint swelling, muscle aches, chest pain, shortness of breath, mood changes.   Objective Blood pressure 120/80, pulse 58, weight 156 lb (70.761 kg).  General: No apparent distress alert and oriented x3 mood and affect normal, dressed appropriately.  HEENT: Pupils equal, extraocular movements intact  Respiratory: Patient's speak in full sentences and does not appear short of breath  Cardiovascular: No lower extremity edema, non tender, no erythema  Skin: Warm dry intact with no signs of infection or rash on extremities or on axial skeleton. Does have significant amount of small bruises on her x-rays. Abdomen: Soft nontender  Neuro: Cranial nerves II through XII are intact, neurovascularly intact in all extremities with 2+ DTRs and 2+ pulses.  Lymph: No lymphadenopathy of posterior or anterior cervical chain or axillae bilaterally.  Gait mild antalgic gait which is new MSK:  Non tender with full range of motion and good stability and symmetric strength and tone of shoulders, elbows, wrist, , and ankles bilaterally. Significant arthritic changes of the hands and other joints. Patient does have a right-sided knee replacement Back Exam:  Inspection: Mild increase in kyphosis even from previous exam Motion: Flexion 35 deg, Extension 25 deg, Side Bending to 45 deg bilaterally,  Rotation to 35 deg bilaterally  SLR laying: Positive straight leg test still present on the right side XSLR laying: Negative  Palpable tenderness: Decrease in tenderness in the paraspinal musculature of the lumbar spine on the right side. Pain over the right sacroiliac joint still present FABER: Tight bilaterally but able to do. Sensory change: Gross worse on the  right side intact to all lumbar and sacral dermatomes.  Reflexes: 2+ at both patellar tendons, 2+ at achilles tendons, Babinski's downgoing.  Strength at foot  Plantar-flexion: 5/5 Dorsi-flexion: 5/5 Eversion: 5/5 Inversion: 5/5  Leg strength  Quad: 5/5 Hamstring: 5/5 Hip flexor: 5/5 Hip abductors: 4/5  Gait unremarkable.    Impression and Recommendations:     This case required medical decision making of moderate complexity.

## 2016-07-16 NOTE — Patient Instructions (Signed)
Good to see you  Ice is your friend still Arnica lotion can help with the bruising, ask a pharmacist pennsaid pinkie amount topically 2 times daily as needed. Watch for bruising though.  Keep trucking along Continue the gabapentin at night If worsening pain then come back and see me and we can discuss the injections in your back.

## 2016-07-16 NOTE — Assessment & Plan Note (Signed)
Discussed with patient again at great length. Patient does have moderate to severe osteophytic changes of the L1-L3 from the back. Some mild progression on last x-rays. Independently visualized by me. We discussed icing regimen, home exercises, we discussed the possibility of epidurals which patient declined. This can be difficult with her being on anticoagulation. Patient has responded fairly well to the gabapentin with no significant side effects. We will continue to monitor. Patient knows if worsening weakness or numbness that she needs to seek medical attention immediately. Otherwise we'll continue therapy and outpatient follow-up again in 6-8 weeks for further evaluation.  Spent  25 minutes with patient face-to-face and had greater than 50% of counseling including as described above in assessment and plan.

## 2016-07-18 DIAGNOSIS — H26492 Other secondary cataract, left eye: Secondary | ICD-10-CM | POA: Diagnosis not present

## 2016-07-18 DIAGNOSIS — H35372 Puckering of macula, left eye: Secondary | ICD-10-CM | POA: Diagnosis not present

## 2016-07-18 DIAGNOSIS — Z961 Presence of intraocular lens: Secondary | ICD-10-CM | POA: Diagnosis not present

## 2016-07-19 DIAGNOSIS — E039 Hypothyroidism, unspecified: Secondary | ICD-10-CM | POA: Diagnosis not present

## 2016-07-19 DIAGNOSIS — K5900 Constipation, unspecified: Secondary | ICD-10-CM | POA: Diagnosis not present

## 2016-07-19 DIAGNOSIS — E876 Hypokalemia: Secondary | ICD-10-CM | POA: Diagnosis not present

## 2016-07-19 DIAGNOSIS — R8299 Other abnormal findings in urine: Secondary | ICD-10-CM | POA: Diagnosis not present

## 2016-07-19 DIAGNOSIS — D649 Anemia, unspecified: Secondary | ICD-10-CM | POA: Diagnosis not present

## 2016-07-19 DIAGNOSIS — R319 Hematuria, unspecified: Secondary | ICD-10-CM | POA: Diagnosis not present

## 2016-07-19 DIAGNOSIS — F320 Major depressive disorder, single episode, mild: Secondary | ICD-10-CM | POA: Diagnosis not present

## 2016-07-19 DIAGNOSIS — I10 Essential (primary) hypertension: Secondary | ICD-10-CM | POA: Diagnosis not present

## 2016-07-19 DIAGNOSIS — E559 Vitamin D deficiency, unspecified: Secondary | ICD-10-CM | POA: Diagnosis not present

## 2016-07-22 ENCOUNTER — Telehealth: Payer: Self-pay

## 2016-07-22 NOTE — Telephone Encounter (Signed)
Patient calling in regarding upcoming laser eye surgery and wanting to know if she can still take her Eliquis.

## 2016-07-24 DIAGNOSIS — H26492 Other secondary cataract, left eye: Secondary | ICD-10-CM | POA: Diagnosis not present

## 2016-07-24 DIAGNOSIS — Z961 Presence of intraocular lens: Secondary | ICD-10-CM | POA: Diagnosis not present

## 2016-07-24 NOTE — Telephone Encounter (Signed)
Patient had eye procedure today.  She is so happy because now she can see.  She did not hold Eliquis because she was never instructed to.  She was just worried that she might need to.

## 2016-08-26 NOTE — Progress Notes (Signed)
[name] [address] [place], [place] [address] Phone: [phone] Subjective:    [id number] of low back pain Now with right leg weakness.  [id number]  [name] [name] is a 82 y.o. 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 Patient is complaining of intermittent radicular symptoms down the leg previously. Patient does have arthritic changes from L1-L3 with progression on last imaging. Patient is on gabapentin. Was doing fairly well previously. Patient states  Patient is any more on the lateral aspect of the right hip. Has been diagnosed with greater trochanteric bursitis previously. Patient states that this seems to be more localized. No radiation down the leg. States though that continues to have some mild back pain as well.  Also complaining of left knee pain. Has a history of a meniscectomy of the left knee. Patient does have osteophytic changes. Has also had a history of a insufficiency fracture of the medial condyle. Patient states that this seems to be more of a dull throbbing aching pain. Has responded fairly well to injections in the past.  Patient has declined epidurals in the past.   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  Past Medical History:  Diagnosis Date  . Allergic rhinitis, cause unspecified   . Anxiety and depression   . Chronic headaches   . Coronary artery disease   . DJD (degenerative joint disease)   . Family history of malignant neoplasm of gastrointestinal tract   . GERD (gastroesophageal reflux disease)   . H/O: hysterectomy   . Hyperlipidemia   . Hypertension   . Hypothyroidism   . Orthostatic hypotension   . Osteoarthrosis, unspecified whether generalized or localized, unspecified site   . Osteoporosis   . Unspecified functional disorder of intestine     Past Surgical History:  Procedure Laterality Date  . ABDOMINAL HYSTERECTOMY    . APPENDECTOMY    . CARDIAC CATHETERIZATION    . CATARACT EXTRACTION W/ INTRAOCULAR LENS IMPLANT Bilateral   . CERVICAL SPINE SURGERY    . cyst removal on back    . GANGLION CYST EXCISION Bilateral   . KNEE SURGERY Left   . NASAL SINUS SURGERY    . TONSILLECTOMY    . TOTAL KNEE ARTHROPLASTY Right    Social History   Social History  . Marital status: Married    Spouse name: N/A  . Number of children: 0  . Years of education: N/A   Occupational History  . retired Retired   Social History Main Topics  . Smoking status: Never Smoker  . Smokeless tobacco: Never Used     Comment: was a passive smoker for 15 yrs  . Alcohol use No  . Drug use: No  . Sexual activity: Not Currently   Other Topics Concern  . Not on file   Social History Narrative  . No narrative on file   Allergies  Allergen Reactions  . Isosorbide Dinitrate Other (See Comments)    Other reaction(s): severe headaches  . Prednisone Other (See Comments)    Heart palpitations  . Amoxicillin Swelling    Lip/throat swelling  . Sudafed [Pseudoephedrine] Other (See Comments)    Other reaction(s): rash  . Aspirin Other (See Comments)    Baby aspirin is tolerated, itching in the past  . Amoxicillin-Pot Clavulanate Swelling and Other (See Comments)  Other reaction(s): Facial and tounge swelling  . Cephalexin Other (See Comments)  . Percocet [Oxycodone-Acetaminophen] Other (See Comments)  . Sulfa Antibiotics Other (See Comments)  . Sulfonamide Derivatives Other (See Comments)    Unknown reaction a long time ago   Family History  Problem Relation Age of Onset  . Brain cancer Other     1st degree relative  . Heart disease Maternal Uncle     x 4  . Colon cancer Mother   . Cancer Mother   . Varicose Veins Mother   . Heart attack Father 48  . Heart disease Father   . Heart disease Maternal Aunt     x 1  . Pancreatic  cancer Neg Hx   . Prostate cancer Neg Hx   . Stomach cancer Neg Hx     pt notes that mother says she had "part of her stomach removed"  . Liver disease Neg Hx   . Kidney disease Neg Hx     Past medical history, social, surgical and family history all reviewed in electronic medical record.  No pertanent information unless stated regarding to the chief complaint.   Review of Systems: No headache, visual changes, nausea, vomiting, diarrhea, constipation, dizziness, abdominal pain, skin rash, fevers, chills, night sweats, weight loss, swollen lymph nodes, body aches, joint swelling, muscle aches, chest pain, shortness of breath, mood changes.   Objective  There were no vitals taken for this visit.  General: No apparent distress alert and oriented x3 mood and affect normal, dressed appropriately.  HEENT: Pupils equal, extraocular movements intact  Respiratory: Patient's speak in full sentences and does not appear short of breath  Cardiovascular: No lower extremity edema, non tender, no erythema  Skin: Warm dry intact with no signs of infection or rash on extremities or on axial skeleton. Does have significant amount of small bruises on her x-rays. Abdomen: Soft nontender  Neuro: Cranial nerves II through XII are intact, neurovascularly intact in all extremities with 2+ DTRs and 2+ pulses.  Lymph: No lymphadenopathy of posterior or anterior cervical chain or axillae bilaterally.  Gait mild antalgic gait which is new MSK:  Non tender with full range of motion and good stability and symmetric strength and tone of shoulders, elbows, wrist, , and ankles bilaterally. Significant arthritic changes of the hands and other joints. Patient does have a right-sided knee replacement Back Exam:  Inspection: Mild increase in kyphosis even from previous exam Motion: Flexion 35 deg, Extension 25 deg, Side Bending to 45 deg bilaterally,  Rotation to 35 deg bilaterally  SLR laying: Positive still on the right  side XSLR laying: Negative  Palpable tenderness: Decrease in tenderness in the paraspinal musculature of the lumbar spine on the right side. Pain over the greater trochanteric area on the right side FABER: Positive right Sensory change: Gross worse on the right side intact to all lumbar and sacral dermatomes.  Reflexes: 2+ at both patellar tendons, 2+ at achilles tendons, Babinski's downgoing.  Strength at foot  Plantar-flexion: 5/5 Dorsi-flexion: 5/5 Eversion: 5/5 Inversion: 5/5  Leg strength  Quad: 5/5 Hamstring: 5/5 Hip flexor: 5/5 Hip abductors: 4/5  Gait unremarkable.  Left knee exam shows the patient does have osteophytic changes. Instability with valgus force. Patient does have some instability noted. Significant calf to thigh ratio. Positive Thessaly's. Crepitus with range of motion.  MSK US performed of: Right This study was ordered, performed, and interpreted by Charlann Boxer D.O.  Hip: Trochanteric bursa with significant hypoechoic changes and swelling  Acetabular labrum visualized and without tears, displacement, or effusion in joint. Femoral neck appears unremarkable without increased power doppler signal along Cortex.  IMPRESSION:  Greater trochanter bursitis   Procedure: Real-time Ultrasound Guided Injection of right greater trochanteric bursitis secondary to patient's body habitus Device: GE Logiq E  Ultrasound guided injection is preferred based studies that show increased duration, increased effect, greater accuracy, decreased procedural pain, increased response rate, and decreased cost with ultrasound guided versus blind injection.  Verbal informed consent obtained.  Time-out conducted.  Noted no overlying erythema, induration, or other signs of local infection.  Skin prepped in a sterile fashion.  Local anesthesia: Topical Ethyl chloride.  With sterile technique and under real time ultrasound guidance:  Greater trochanteric area was visualized and patient's bursa was  noted. A 22-gauge 3 inch needle was inserted and 4 cc of 0.5% Marcaine and 1 cc of Kenalog 40 mg/dL was injected. Pictures taken Completed without difficulty  Pain immediately resolved suggesting accurate placement of the medication.  Advised to call if fevers/chills, erythema, induration, drainage, or persistent bleeding.  Images permanently stored and available for review in the ultrasound unit.  Impression: Technically successful ultrasound guided injection.   After informed written and verbal consent, patient was seated on exam table. Left knee was prepped with alcohol swab and utilizing anterolateral approach, patient's left knee space was injected with 4:1  marcaine 0.5%: Kenalog 40mg /dL. Patient tolerated the procedure well without immediate complications.   Impression and Recommendations:     This case required medical decision making of moderate complexity.

## 2016-08-27 ENCOUNTER — Encounter: Payer: Self-pay | Admitting: Family Medicine

## 2016-08-27 ENCOUNTER — Ambulatory Visit (INDEPENDENT_AMBULATORY_CARE_PROVIDER_SITE_OTHER): Payer: Medicare Other | Admitting: Family Medicine

## 2016-08-27 ENCOUNTER — Other Ambulatory Visit: Payer: Self-pay

## 2016-08-27 VITALS — BP 102/70 | HR 60 | Wt 153.0 lb

## 2016-08-27 DIAGNOSIS — M5416 Radiculopathy, lumbar region: Secondary | ICD-10-CM | POA: Diagnosis not present

## 2016-08-27 DIAGNOSIS — R5383 Other fatigue: Secondary | ICD-10-CM | POA: Diagnosis not present

## 2016-08-27 DIAGNOSIS — R42 Dizziness and giddiness: Secondary | ICD-10-CM | POA: Diagnosis not present

## 2016-08-27 DIAGNOSIS — E039 Hypothyroidism, unspecified: Secondary | ICD-10-CM | POA: Diagnosis not present

## 2016-08-27 DIAGNOSIS — Z23 Encounter for immunization: Secondary | ICD-10-CM | POA: Diagnosis not present

## 2016-08-27 DIAGNOSIS — M25551 Pain in right hip: Secondary | ICD-10-CM

## 2016-08-27 DIAGNOSIS — M1712 Unilateral primary osteoarthritis, left knee: Secondary | ICD-10-CM | POA: Diagnosis not present

## 2016-08-27 DIAGNOSIS — R2689 Other abnormalities of gait and mobility: Secondary | ICD-10-CM | POA: Diagnosis not present

## 2016-08-27 DIAGNOSIS — M7061 Trochanteric bursitis, right hip: Secondary | ICD-10-CM

## 2016-08-27 DIAGNOSIS — N390 Urinary tract infection, site not specified: Secondary | ICD-10-CM | POA: Diagnosis not present

## 2016-08-27 MED ORDER — VITAMIN D (ERGOCALCIFEROL) 1.25 MG (50000 UNIT) PO CAPS: 50000.0000 [IU] | ORAL_CAPSULE | ORAL | 0 refills | Status: DC

## 2016-08-27 MED ORDER — GABAPENTIN 300 MG PO CAPS: 300.0000 mg | ORAL_CAPSULE | Freq: Every day | ORAL | 3 refills | Status: DC

## 2016-08-27 NOTE — Patient Instructions (Addendum)
Good to see you as always.  Stay active.  Avoid significant lifting.  COntinue the gabapentin at night  If the hip is not better after injection lest consider looking at your back with a MRI.  For the knee injected that today as well and if not better consider orthovisc.  Continue the vitamins but will start once weekly vitamin D for next 12 weeks.  pennsaid pinkie amount topically 2 times daily as needed. Stop if worsening bruising.  See me again in 4-6 weeks.

## 2016-08-27 NOTE — Assessment & Plan Note (Signed)
Patient given injection today and tolerated the procedure well. We discussed icing regimen and home exercises. Patient has worsening symptoms we'll consider further evaluation of patient's back.

## 2016-08-27 NOTE — Assessment & Plan Note (Signed)
Patient given another injection today. Tolerated the procedure well. We discussed icing regimen. Discussed we can do injections every 3 months if necessary. Patient continues to have active. If worsening symptoms we can consider viscous supple mentation. Follow-up again in 4-6 weeks.

## 2016-08-27 NOTE — Assessment & Plan Note (Signed)
Still concern for more of a lumbar radiculopathy. We'll continue to monitor. Patient is to take gabapentin regularly. If worsening symptoms advance imaging would be warranted especially if there is weakness.

## 2016-09-05 ENCOUNTER — Other Ambulatory Visit: Payer: Self-pay | Admitting: Family Medicine

## 2016-09-05 DIAGNOSIS — R739 Hyperglycemia, unspecified: Secondary | ICD-10-CM | POA: Diagnosis not present

## 2016-09-09 NOTE — Telephone Encounter (Signed)
Refill done.  

## 2016-09-19 DIAGNOSIS — S80861A Insect bite (nonvenomous), right lower leg, initial encounter: Secondary | ICD-10-CM | POA: Diagnosis not present

## 2016-09-19 DIAGNOSIS — L03115 Cellulitis of right lower limb: Secondary | ICD-10-CM | POA: Diagnosis not present

## 2016-09-21 DIAGNOSIS — L03115 Cellulitis of right lower limb: Secondary | ICD-10-CM | POA: Diagnosis not present

## 2016-09-24 DIAGNOSIS — L03115 Cellulitis of right lower limb: Secondary | ICD-10-CM | POA: Diagnosis not present

## 2016-10-03 ENCOUNTER — Other Ambulatory Visit: Payer: Medicare Other

## 2016-10-09 ENCOUNTER — Ambulatory Visit (INDEPENDENT_AMBULATORY_CARE_PROVIDER_SITE_OTHER): Payer: Medicare Other | Admitting: Family Medicine

## 2016-10-09 ENCOUNTER — Encounter: Payer: Self-pay | Admitting: Family Medicine

## 2016-10-09 DIAGNOSIS — M1712 Unilateral primary osteoarthritis, left knee: Secondary | ICD-10-CM | POA: Diagnosis not present

## 2016-10-09 NOTE — Assessment & Plan Note (Signed)
Patient started viscous supplementation with her failing all other conservative therapy. Patient does have a custom brace and will try to wear this on a more regular basis. Patient come back in 1 week for 2nd in a series of 4

## 2016-10-09 NOTE — Patient Instructions (Signed)
God to see you  [name] [name] is your friend Buddy Duty orthovisc today  Increase gabapentin to 400mg  at night and I hope it helps.  They will call you on the brace and you can meet them next week here if you want when we do the 2nd injection.

## 2016-10-09 NOTE — Progress Notes (Signed)
[name] [address] [place], [place] [address] Phone: [phone] Subjective:    [id number] of low back pain Now with right leg weakness.  [id number]  [name] [name] is a 82 y.o. 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 Patient is complaining of intermittent radicular symptoms down the leg previously. Patient does have arthritic changes from L1-L3 with progression on last imaging. Patient is on gabapentin.States no side effects. Does help her with sleep. Patient was found also have a greater trochanter bursitis and was given an injection 6 weeks ago. Patient states injection was helpful. Patient continues to have more of a right-sided back pain.  Also complaining of left knee pain. Has a history of a meniscectomy of the left knee. Patient does have osteophytic changes. Has also had a history of a insufficiency fracture of the medial condyle.  She was having worsening symptoms and was given an injection 6 weeks ago. Patient states only mild improvement in pain.  Patient has declined epidurals in the past.   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  Past Medical History:  Diagnosis Date  . Allergic rhinitis, cause unspecified   . Anxiety and depression   . Chronic headaches   . Coronary artery disease   . DJD (degenerative joint disease)   . Family history of malignant neoplasm of gastrointestinal tract   . GERD (gastroesophageal reflux disease)   . H/O: hysterectomy   . Hyperlipidemia   . Hypertension   . Hypothyroidism   . Orthostatic hypotension   . Osteoarthrosis, unspecified whether generalized or localized, unspecified site   . Osteoporosis   . Unspecified functional disorder of intestine    Past Surgical History:  Procedure Laterality Date  . ABDOMINAL HYSTERECTOMY     . APPENDECTOMY    . CARDIAC CATHETERIZATION    . CATARACT EXTRACTION W/ INTRAOCULAR LENS IMPLANT Bilateral   . CERVICAL SPINE SURGERY    . cyst removal on back    . GANGLION CYST EXCISION Bilateral   . KNEE SURGERY Left   . NASAL SINUS SURGERY    . TONSILLECTOMY    . TOTAL KNEE ARTHROPLASTY Right    Social History   Social History  . Marital status: Married    Spouse name: N/A  . Number of children: 0  . Years of education: N/A   Occupational History  . retired Retired   Social History Main Topics  . Smoking status: Never Smoker  . Smokeless tobacco: Never Used     Comment: was a passive smoker for 15 yrs  . Alcohol use No  . Drug use: No  . Sexual activity: Not Currently   Other Topics Concern  . Not on file   Social History Narrative  . No narrative on file   Allergies  Allergen Reactions  . Isosorbide Dinitrate Other (See Comments)    Other reaction(s): severe headaches  . Prednisone Other (See Comments)    Heart palpitations  . Amoxicillin Swelling    Lip/throat swelling  . Sudafed [Pseudoephedrine] Other (See Comments)    Other reaction(s): rash  . Aspirin Other (See Comments)    Baby aspirin is tolerated, itching in the past  . Amoxicillin-Pot Clavulanate Swelling and Other (See Comments)    Other reaction(s): Facial and tounge swelling  . Cephalexin Other (See Comments)  .  Percocet [Oxycodone-Acetaminophen] Other (See Comments)  . Sulfa Antibiotics Other (See Comments)  . Sulfonamide Derivatives Other (See Comments)    Unknown reaction a long time ago   Family History  Problem Relation Age of Onset  . Brain cancer Other     1st degree relative  . Heart disease Maternal Uncle     x 4  . Colon cancer Mother   . Cancer Mother   . Varicose Veins Mother   . Heart attack Father 52  . Heart disease Father   . Heart disease Maternal Aunt     x 1  . Pancreatic cancer Neg Hx   . Prostate cancer Neg Hx   . Stomach cancer Neg Hx     pt notes that  mother says she had "part of her stomach removed"  . Liver disease Neg Hx   . Kidney disease Neg Hx     Past medical history, social, surgical and family history all reviewed in electronic medical record.  No pertanent information unless stated regarding to the chief complaint.   Review of Systems: No headache, visual changes, nausea, vomiting, diarrhea, constipation, dizziness, abdominal pain, skin rash, fevers, chills, night sweats, weight loss, swollen lymph nodes, body aches, joint swelling, muscle aches, chest pain, shortness of breath, mood changes.   Objective  Blood pressure 104/80, pulse 60, weight 145 lb (65.8 kg).  General: No apparent distress alert and oriented x3 mood and affect normal, dressed appropriately.  HEENT: Pupils equal, extraocular movements intact  Respiratory: Patient's speak in full sentences and does not appear short of breath  Cardiovascular: No lower extremity edema, non tender, no erythema  Skin: Warm dry intact with no signs of infection or rash on extremities or on axial skeleton. Does have significant amount of small bruises on her x-rays. Abdomen: Soft nontender  Neuro: Cranial nerves II through XII are intact, neurovascularly intact in all extremities with 2+ DTRs and 2+ pulses.  Lymph: No lymphadenopathy of posterior or anterior cervical chain or axillae bilaterally.  Gait mild improvement in gait MSK:  Non tender with full range of motion and good stability and symmetric strength and tone of shoulders, elbows, wrist, , and ankles bilaterally. Significant arthritic changes of the hands and other joints. Patient does have a right-sided knee replacement Back Exam:  Inspection: kyphosis still appreciated Motion: Flexion 35 deg, Extension 25 deg, Side Bending to 45 deg bilaterally,  Rotation to 35 deg bilaterally  SLR laying: mild positive on the right side XSLR laying: Negative  Palpable tenderness: Decrease in tenderness in the paraspinal musculature of  the lumbar spine on the right side. No pain over the greater trochanteric area FABER: negative Sensory change: Gross worse on the right side intact to all lumbar and sacral dermatomes.  Reflexes: 2+ at both patellar tendons, 2+ at achilles tendons, Babinski's downgoing.  Strength at foot  Plantar-flexion: 5/5 Dorsi-flexion: 5/5 Eversion: 5/5 Inversion: 5/5  Leg strength  Quad: 5/5 Hamstring: 5/5 Hip flexor: 5/5 Hip abductors: 4/5  Gait unremarkable.  Left knee exam shows the patient does have osteophytic changes. Instability with valgus force. Continued pain over the medial joint space   After informed written and verbal consent, patient was seated on exam table. Left knee was prepped with alcohol swab and utilizing anterolateral approach, patient's left knee space was injected with 15 mg/2.5 mL of Orthovisc(sodium hyaluronate) in a prefilled syringe was injected easily into the knee through a 22-gauge needle.. Patient tolerated the procedure well without immediate complications.  Impression and Recommendations:     This case required medical decision making of moderate complexity.

## 2016-10-13 ENCOUNTER — Other Ambulatory Visit: Payer: Self-pay | Admitting: Internal Medicine

## 2016-10-15 NOTE — Progress Notes (Signed)
[name] [address] [place], [place] [address] Phone: [phone] Subjective:    CC: Left knee pain follow up  [id number]  [name] [name] is a 82 y.o. female coming in with complaint of Low back pain follow-up   Also complaining of left knee pain.Failed all conservative therapy. Patient is here for second in a series of 4 injections of viscous supplementation. Patient states   Patient has declined epidurals in the past.   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  Past Medical History:  Diagnosis Date  . Allergic rhinitis, cause unspecified   . Anxiety and depression   . Chronic headaches   . Coronary artery disease   . DJD (degenerative joint disease)   . Family history of malignant neoplasm of gastrointestinal tract   . GERD (gastroesophageal reflux disease)   . H/O: hysterectomy   . Hyperlipidemia   . Hypertension   . Hypothyroidism   . Orthostatic hypotension   . Osteoarthrosis, unspecified whether generalized or localized, unspecified site   . Osteoporosis   . Unspecified functional disorder of intestine    Past Surgical History:  Procedure Laterality Date  . ABDOMINAL HYSTERECTOMY    . APPENDECTOMY    . CARDIAC CATHETERIZATION    . CATARACT EXTRACTION W/ INTRAOCULAR LENS IMPLANT Bilateral   . CERVICAL SPINE SURGERY    . cyst removal on back    . GANGLION CYST EXCISION Bilateral   . KNEE SURGERY Left   . NASAL SINUS SURGERY    . TONSILLECTOMY    . TOTAL KNEE ARTHROPLASTY Right    Social History   Social History  . Marital status: Married    Spouse name: N/A  . Number of children: 0  . Years of education: N/A   Occupational History  . retired Retired   Social History Main Topics  . Smoking status: Never Smoker  . Smokeless tobacco: Never Used     Comment: was a passive smoker for 15 yrs  . Alcohol use No  . Drug use: No  . Sexual  activity: Not Currently   Other Topics Concern  . Not on file   Social History Narrative  . No narrative on file   Allergies  Allergen Reactions  . Isosorbide Dinitrate Other (See Comments)    Other reaction(s): severe headaches  . Prednisone Other (See Comments)    Heart palpitations  . Amoxicillin Swelling    Lip/throat swelling  . Sudafed [Pseudoephedrine] Other (See Comments)    Other reaction(s): rash  . Aspirin Other (See Comments)    Baby aspirin is tolerated, itching in the past  . Amoxicillin-Pot Clavulanate Swelling and Other (See Comments)    Other reaction(s): Facial and tounge swelling  . Cephalexin Other (See Comments)  . Percocet [Oxycodone-Acetaminophen] Other (See Comments)  . Sulfa Antibiotics Other (See Comments)  . Sulfonamide Derivatives Other (See Comments)    Unknown reaction a long time ago   Family History  Problem Relation Age of Onset  . Brain cancer Other     1st degree relative  . Heart disease Maternal Uncle     x 4  . Colon cancer Mother   . Cancer Mother   . Varicose Veins Mother   . Heart attack Father 28  . Heart disease Father   . Heart disease Maternal Aunt     x 1  . Pancreatic cancer Neg Hx   .  Prostate cancer Neg Hx   . Stomach cancer Neg Hx     pt notes that mother says she had "part of her stomach removed"  . Liver disease Neg Hx   . Kidney disease Neg Hx     Past medical history, social, surgical and family history all reviewed in electronic medical record.  No pertanent information unless stated regarding to the chief complaint.   Review of Systems: No headache, visual changes, nausea, vomiting, diarrhea, constipation, dizziness, abdominal pain, skin rash, fevers, chills, night sweats, weight loss, swollen lymph nodes, body aches, joint swelling, muscle aches, chest pain, shortness of breath, mood changes.   Objective  There were no vitals taken for this visit.  General: No apparent distress alert and oriented x3 mood  and affect normal, dressed appropriately.  HEENT: Pupils equal, extraocular movements intact  Respiratory: Patient's speak in full sentences and does not appear short of breath  Cardiovascular: No lower extremity edema, non tender, no erythema  Skin: Warm dry intact with no signs of infection or rash on extremities or on axial skeleton. Does have significant amount of small bruises on her x-rays. Abdomen: Soft nontender  Neuro: Cranial nerves II through XII are intact, neurovascularly intact in all extremities with 2+ DTRs and 2+ pulses.  Lymph: No lymphadenopathy of posterior or anterior cervical chain or axillae bilaterally.  Gait mild improvement in gait MSK:  Non tender with full range of motion and good stability and symmetric strength and tone of shoulders, elbows, wrist, , and ankles bilaterally. Significant arthritic changes of the hands and other joints. Patient does have a right-sided knee replacement   Left knee exam shows the patient does have osteophytic changes. Instability with valgus force. Continued pain over the medial joint space No significant change from previous exam.  After informed written and verbal consent, patient was seated on exam table. Left knee was prepped with alcohol swab and utilizing anterolateral approach, patient's left knee space was injected with 15 mg/2.5 mL of Orthovisc(sodium hyaluronate) in a prefilled syringe was injected easily into the knee through a 22-gauge needle.. Patient tolerated the procedure well without immediate complications.   Impression and Recommendations:     This case required medical decision making of moderate complexity.

## 2016-10-16 ENCOUNTER — Encounter: Payer: Self-pay | Admitting: Family Medicine

## 2016-10-16 ENCOUNTER — Ambulatory Visit (INDEPENDENT_AMBULATORY_CARE_PROVIDER_SITE_OTHER): Payer: Medicare Other | Admitting: Family Medicine

## 2016-10-16 DIAGNOSIS — M1712 Unilateral primary osteoarthritis, left knee: Secondary | ICD-10-CM | POA: Diagnosis not present

## 2016-10-16 NOTE — Patient Instructions (Signed)
Good to see you  Ice in 6 hours See you again next week!

## 2016-10-16 NOTE — Assessment & Plan Note (Signed)
Second in a series of 4 injections given today in patient's left knee. We discussed icing regimen and home exercises. We discussed which activities to do an which was to avoid. Patient will continue the same medications. Follow-up in one week for third in a series of 4 injections.

## 2016-10-23 ENCOUNTER — Ambulatory Visit (INDEPENDENT_AMBULATORY_CARE_PROVIDER_SITE_OTHER): Payer: Medicare Other | Admitting: Family Medicine

## 2016-10-23 ENCOUNTER — Encounter: Payer: Self-pay | Admitting: Family Medicine

## 2016-10-23 DIAGNOSIS — M1712 Unilateral primary osteoarthritis, left knee: Secondary | ICD-10-CM | POA: Diagnosis not present

## 2016-10-23 NOTE — Patient Instructions (Signed)
Great to see you  One more to go  Keep doing what you are doing.

## 2016-10-23 NOTE — Progress Notes (Signed)
[name] [place], [place] [address] Phone: [phone] Subjective:    CC: Left knee pain follow up  [id number]  [name] [name] is a 82 y.o. female coming in with complaint of Left knee follow-up   Also complaining of left knee pain.Failed all conservative therapy. Patient is here for third in a series of 4 injections of viscous supplementation. Patient states mild to moderate improvement. Continues to have some instability. Feels that she is making some progress. Less pain overall    Past Medical History:  Diagnosis Date  . Allergic rhinitis, cause unspecified   . Anxiety and depression   . Chronic headaches   . Coronary artery disease   . DJD (degenerative joint disease)   . Family history of malignant neoplasm of gastrointestinal tract   . GERD (gastroesophageal reflux disease)   . H/O: hysterectomy   . Hyperlipidemia   . Hypertension   . Hypothyroidism   . Orthostatic hypotension   . Osteoarthrosis, unspecified whether generalized or localized, unspecified site   . Osteoporosis   . Unspecified functional disorder of intestine    Past Surgical History:  Procedure Laterality Date  . ABDOMINAL HYSTERECTOMY    . APPENDECTOMY    . CARDIAC CATHETERIZATION    . CATARACT EXTRACTION W/ INTRAOCULAR LENS IMPLANT Bilateral   . CERVICAL SPINE SURGERY    . cyst removal on back    . GANGLION CYST EXCISION Bilateral   . KNEE SURGERY Left   . NASAL SINUS SURGERY    . TONSILLECTOMY    . TOTAL KNEE ARTHROPLASTY Right    Social History   Social History  . Marital status: Married    Spouse name: N/A  . Number of children: 0  . Years of education: N/A   Occupational History  . retired Retired   Social History Main Topics  . Smoking status: Never Smoker  . Smokeless tobacco: Never Used     Comment: was a passive smoker for 15 yrs  . Alcohol use No  . Drug use: No  . Sexual activity: Not Currently   Other Topics Concern    . None   Social History Narrative  . None   Allergies  Allergen Reactions  . Isosorbide Dinitrate Other (See Comments)    Other reaction(s): severe headaches  . Prednisone Other (See Comments)    Heart palpitations  . Amoxicillin Swelling    Lip/throat swelling  . Sudafed [Pseudoephedrine] Other (See Comments)    Other reaction(s): rash  . Aspirin Other (See Comments)    Baby aspirin is tolerated, itching in the past  . Amoxicillin-Pot Clavulanate Swelling and Other (See Comments)    Other reaction(s): Facial and tounge swelling  . Cephalexin Other (See Comments)  . Percocet [Oxycodone-Acetaminophen] Other (See Comments)  . Sulfa Antibiotics Other (See Comments)  . Sulfonamide Derivatives Other (See Comments)    Unknown reaction a long time ago   Family History  Problem Relation Age of Onset  . Brain cancer Other     1st degree relative  . Heart disease Maternal Uncle     x 4  . Colon cancer Mother   . Cancer Mother   . Varicose Veins Mother   . Heart attack Father 60  . Heart disease Father   . Heart disease Maternal Aunt     x 1  . Pancreatic cancer Neg Hx   . Prostate cancer Neg Hx   . Stomach cancer Neg Hx  pt notes that mother says she had "part of her stomach removed"  . Liver disease Neg Hx   . Kidney disease Neg Hx     Past medical history, social, surgical and family history all reviewed in electronic medical record.  No pertanent information unless stated regarding to the chief complaint.   Review of Systems: No headache, visual changes, nausea, vomiting, diarrhea, constipation, dizziness, abdominal pain, skin rash, fevers, chills, night sweats, weight loss, swollen lymph nodes, body aches, joint swelling, muscle aches, chest pain, shortness of breath, mood changes.   Objective  Blood pressure 120/80, pulse (!) 55, SpO2 96 %.  General: No apparent distress alert and oriented x3 mood and affect normal, dressed appropriately.  HEENT: Pupils equal,  extraocular movements intact  Respiratory: Patient's speak in full sentences and does not appear short of breath  Cardiovascular: No lower extremity edema, non tender, no erythema  Skin: Warm dry intact with no signs of infection or rash on extremities or on axial skeleton. Does have significant amount of small bruises on her x-rays. Abdomen: Soft nontender  Neuro: Cranial nerves II through XII are intact, neurovascularly intact in all extremities with 2+ DTRs and 2+ pulses.  Lymph: No lymphadenopathy of posterior or anterior cervical chain or axillae bilaterally.  Gait mild improvement in gait MSK:  Non tender with full range of motion and good stability and symmetric strength and tone of shoulders, elbows, wrist, , and ankles bilaterally. Significant arthritic changes of the hands and other joints. Patient does have a right-sided knee replacement   Left knee exam shows the patient does have osteophytic changes. Instability with valgus force. Continued pain over the medial joint space but possibly less than previous exam. Mild improvement from previous exam.   After informed written and verbal consent, patient was seated on exam table. Left knee was prepped with alcohol swab and utilizing anterolateral approach, patient's left knee space was injected with 15 mg/2.5 mL of Orthovisc(sodium hyaluronate) in a prefilled syringe was injected easily into the knee through a 22-gauge needle.. Patient tolerated the procedure well without immediate complications.   Impression and Recommendations:     This case required medical decision making of moderate complexity.

## 2016-10-23 NOTE — Assessment & Plan Note (Signed)
Reiterated conservative therapy. 3 out of 4 injections given today. Patient will return in one week for fourth and final injection.

## 2016-10-30 ENCOUNTER — Other Ambulatory Visit: Payer: Self-pay | Admitting: *Deleted

## 2016-10-30 MED ORDER — APIXABAN 5 MG PO TABS: 5.0000 mg | ORAL_TABLET | Freq: Two times a day (BID) | ORAL | 0 refills | Status: DC

## 2016-10-30 NOTE — Progress Notes (Signed)
[name] [place], [place] [address] Phone: [phone] Subjective:    CC: Left knee pain follow up  [id number]  [name] [name] is a 82 y.o. female coming in with complaint of Left knee follow-up Severe arthritic changes of the knee.  Also complaining of left knee pain.Failed all conservative therapy. Patient is Here for fourth and final injection in the series. Patient states     Past Medical History:  Diagnosis Date  . Allergic rhinitis, cause unspecified   . Anxiety and depression   . Chronic headaches   . Coronary artery disease   . DJD (degenerative joint disease)   . Family history of malignant neoplasm of gastrointestinal tract   . GERD (gastroesophageal reflux disease)   . H/O: hysterectomy   . Hyperlipidemia   . Hypertension   . Hypothyroidism   . Orthostatic hypotension   . Osteoarthrosis, unspecified whether generalized or localized, unspecified site   . Osteoporosis   . Unspecified functional disorder of intestine    Past Surgical History:  Procedure Laterality Date  . ABDOMINAL HYSTERECTOMY    . APPENDECTOMY    . CARDIAC CATHETERIZATION    . CATARACT EXTRACTION W/ INTRAOCULAR LENS IMPLANT Bilateral   . CERVICAL SPINE SURGERY    . cyst removal on back    . GANGLION CYST EXCISION Bilateral   . KNEE SURGERY Left   . NASAL SINUS SURGERY    . TONSILLECTOMY    . TOTAL KNEE ARTHROPLASTY Right    Social History   Social History  . Marital status: Married    Spouse name: N/A  . Number of children: 0  . Years of education: N/A   Occupational History  . retired Retired   Social History Main Topics  . Smoking status: Never Smoker  . Smokeless tobacco: Never Used     Comment: was a passive smoker for 15 yrs  . Alcohol use No  . Drug use: No  . Sexual activity: Not Currently   Other Topics Concern  . Not on file   Social History Narrative  . No narrative on file   Allergies  Allergen Reactions    . Isosorbide Dinitrate Other (See Comments)    Other reaction(s): severe headaches  . Prednisone Other (See Comments)    Heart palpitations  . Amoxicillin Swelling    Lip/throat swelling  . Sudafed [Pseudoephedrine] Other (See Comments)    Other reaction(s): rash  . Aspirin Other (See Comments)    Baby aspirin is tolerated, itching in the past  . Amoxicillin-Pot Clavulanate Swelling and Other (See Comments)    Other reaction(s): Facial and tounge swelling  . Cephalexin Other (See Comments)  . Percocet [Oxycodone-Acetaminophen] Other (See Comments)  . Sulfa Antibiotics Other (See Comments)  . Sulfonamide Derivatives Other (See Comments)    Unknown reaction a long time ago   Family History  Problem Relation Age of Onset  . Brain cancer Other     1st degree relative  . Heart disease Maternal Uncle     x 4  . Colon cancer Mother   . Cancer Mother   . Varicose Veins Mother   . Heart attack Father 7  . Heart disease Father   . Heart disease Maternal Aunt     x 1  . Pancreatic cancer Neg Hx   . Prostate cancer Neg Hx   . Stomach cancer Neg Hx     pt notes that mother says she had "part  of her stomach removed"  . Liver disease Neg Hx   . Kidney disease Neg Hx     Past medical history, social, surgical and family history all reviewed in electronic medical record.  No pertanent information unless stated regarding to the chief complaint.   Review of Systems: No headache, visual changes, nausea, vomiting, diarrhea, constipation, dizziness, abdominal pain, skin rash, fevers, chills, night sweats, weight loss, swollen lymph nodes, body aches, joint swelling, muscle aches, chest pain, shortness of breath, mood changes.   Objective  There were no vitals taken for this visit.  Systems examined below as of 10/31/16 General: No apparent distress alert and oriented x3 mood and affect normal, dressed appropriately.  HEENT: Pupils equal, extraocular movements intact  Respiratory:  Patient's speak in full sentences and does not appear short of breath  Cardiovascular: No lower extremity edema, non tender, no erythema  Skin: Warm dry intact with no signs of infection or rash on extremities or on axial skeleton. Does have significant amount of small bruises on her x-rays. Abdomen: Soft nontender  Neuro: Cranial nerves II through XII are intact, neurovascularly intact in all extremities with 2+ DTRs and 2+ pulses.  Lymph: No lymphadenopathy of posterior or anterior cervical chain or axillae bilaterally.  Gait mild improvement in gait MSK:  Non tender with full range of motion and good stability and symmetric strength and tone of shoulders, elbows, wrist, , and ankles bilaterally. Significant arthritic changes of the hands and other joints. Patient does have a right-sided knee replacement   Left knee exam shows the patient does have osteophytic changes. Instability with valgus force. Continued pain over the medial joint space but possibly less than previous exam. Minimal change from previous exam    After informed written and verbal consent, patient was seated on exam table. Left knee was prepped with alcohol swab and utilizing anterolateral approach, patient's left knee space was injected with15 mg/2.5 mL of Orthovisc(sodium hyaluronate) in a prefilled syringe was injected easily into the knee through a 22-gauge needle..Patient tolerated the procedure well without immediate complications.   Impression and Recommendations:     This case required medical decision making of moderate complexity.

## 2016-10-31 ENCOUNTER — Encounter: Payer: Self-pay | Admitting: Family Medicine

## 2016-10-31 ENCOUNTER — Ambulatory Visit (INDEPENDENT_AMBULATORY_CARE_PROVIDER_SITE_OTHER): Payer: Medicare Other | Admitting: Family Medicine

## 2016-10-31 DIAGNOSIS — M1712 Unilateral primary osteoarthritis, left knee: Secondary | ICD-10-CM

## 2016-10-31 NOTE — Patient Instructions (Signed)
Congrats you made it.  Stay active overall.  Ice is your friend See me again when you need me. We can repeat series every 6 months and if pain comes back before that we can do another steroid injection

## 2016-10-31 NOTE — Assessment & Plan Note (Signed)
Patient seems to be doing relatively well. Patient seems to be improving. Patient is finish the series at this time. Follow-up in 4 weeks for further evaluation.

## 2016-11-01 IMAGING — CT CT HEAD W/O CM
2 series · 15 of 30 positions shown, 17 images · non-contrast
Comparison: Head CT dated 03/09/2014.

CLINICAL DATA: Multiple falls due to dizziness, fall today hitting
back of head.

EXAM:
CT HEAD WITHOUT CONTRAST
TECHNIQUE: Contiguous axial images were obtained from the base of the skull
through the vertex without intravenous contrast.

[Series 2: head without · axial · non-contrast · 0.41mm/px · z∈[-99,+6]mm · 7 of 29 slices shown, 9 images]
[im 4/29  brain]
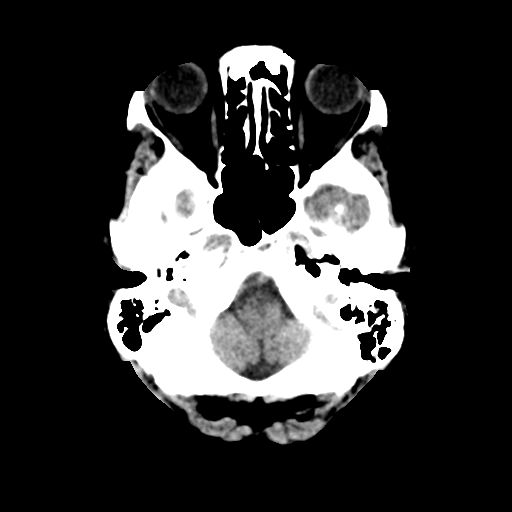
[im 4/29  bone]
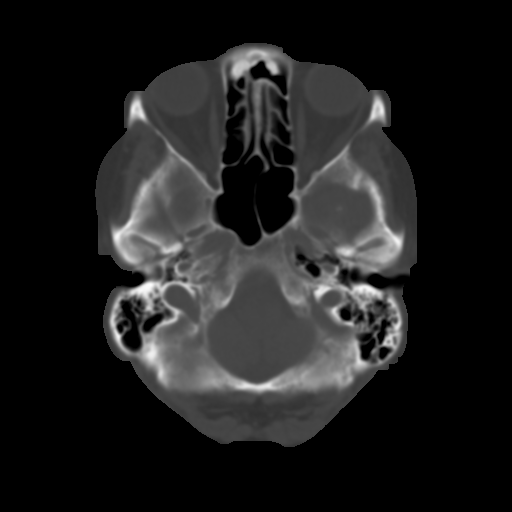
[im 8/29  brain]
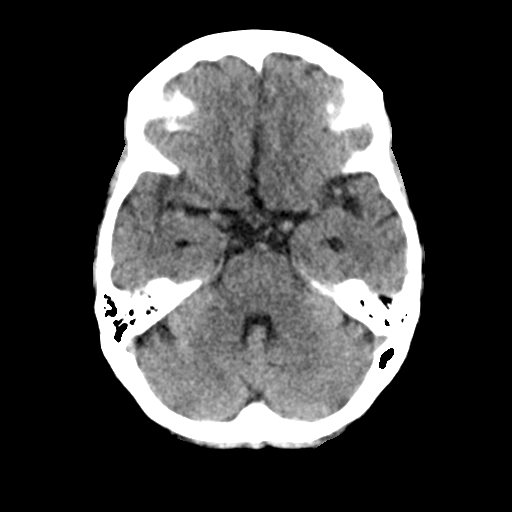
[im 11/29  brain]
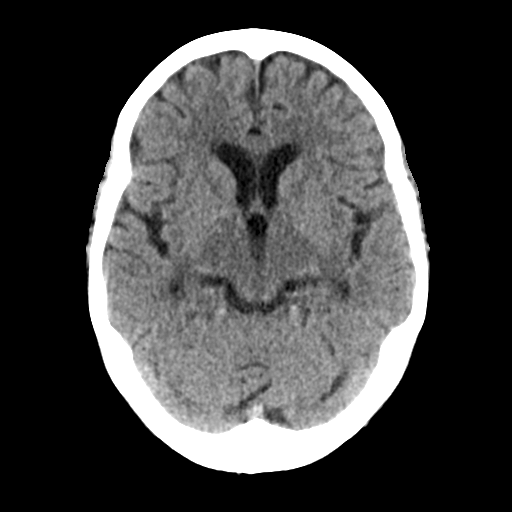
[im 15/29  brain]
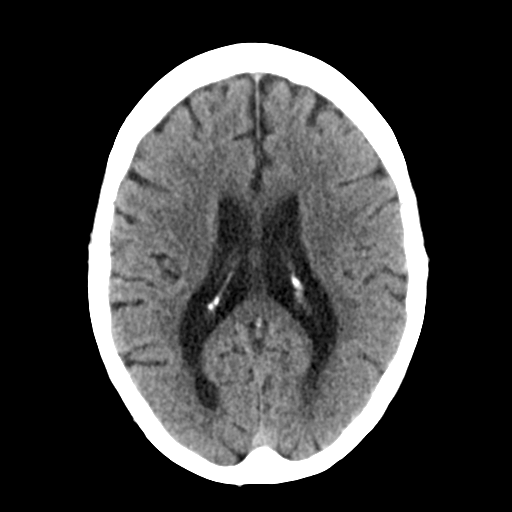
[im 18/29  brain]
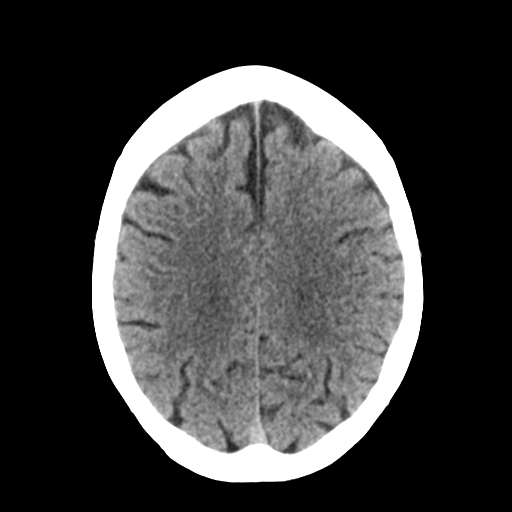
[im 18/29  bone]
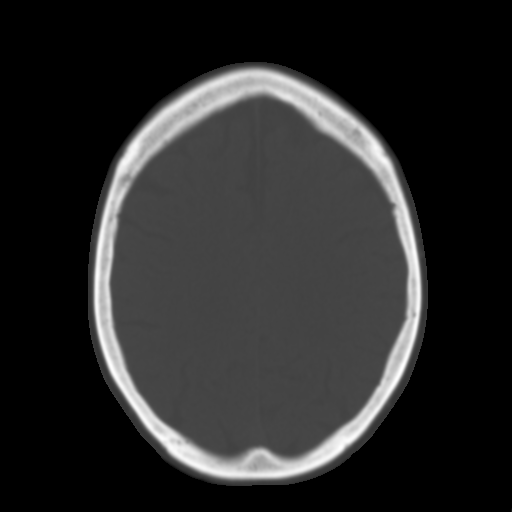
[im 22/29  brain]
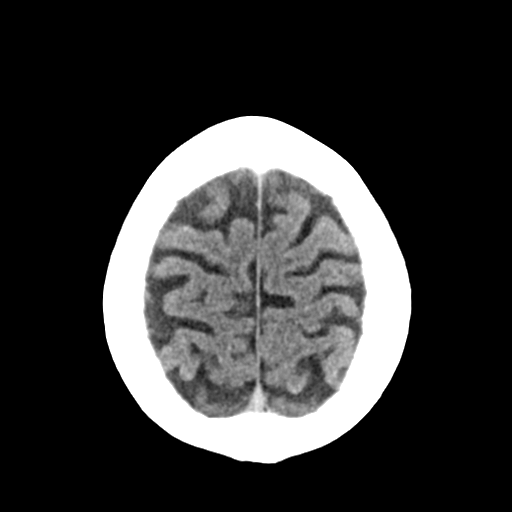
[im 25/29  brain]
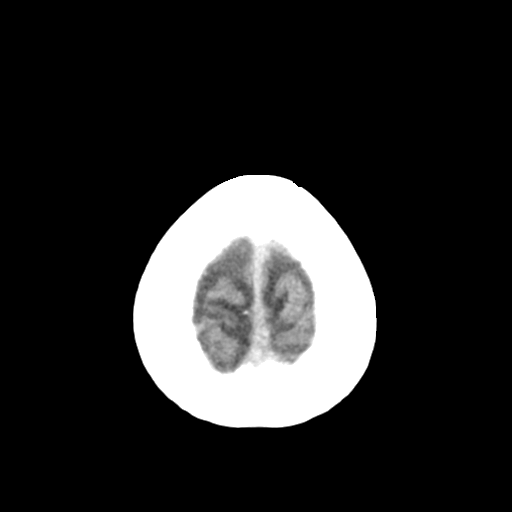

[Series 3: head bone · axial · 0.41mm/px · z∈[-100,+12]mm · 8 of 71 slices shown]
[im 8/71  bone]
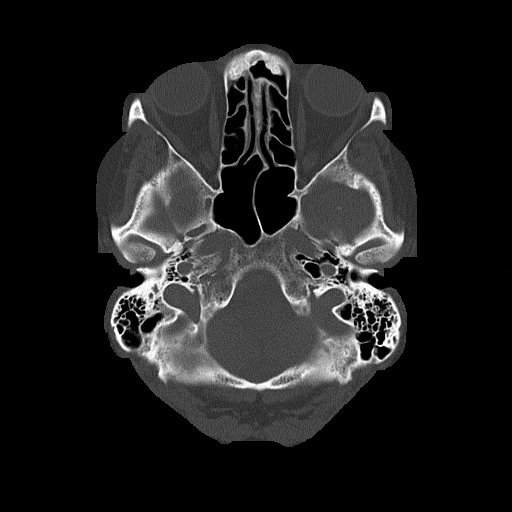
[im 15/71  bone]
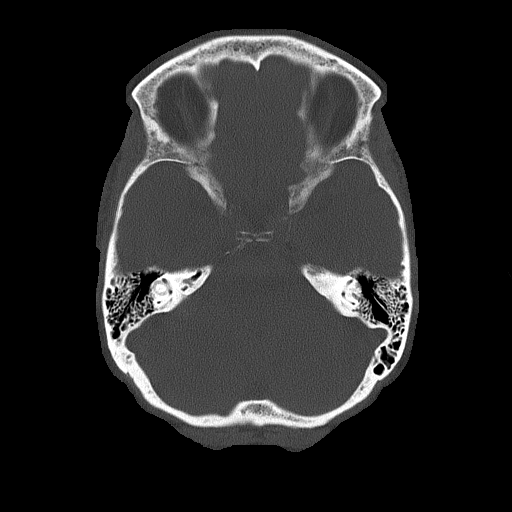
[im 22/71  bone]
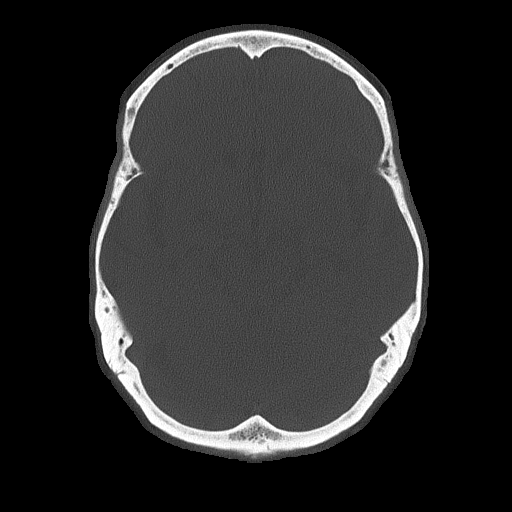
[im 32/71  bone]
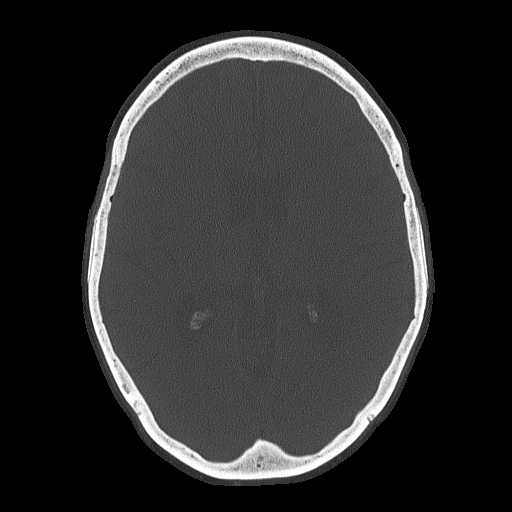
[im 39/71  bone]
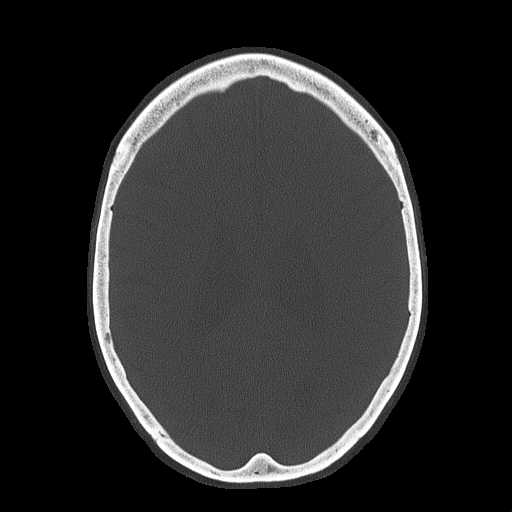
[im 50/71  bone]
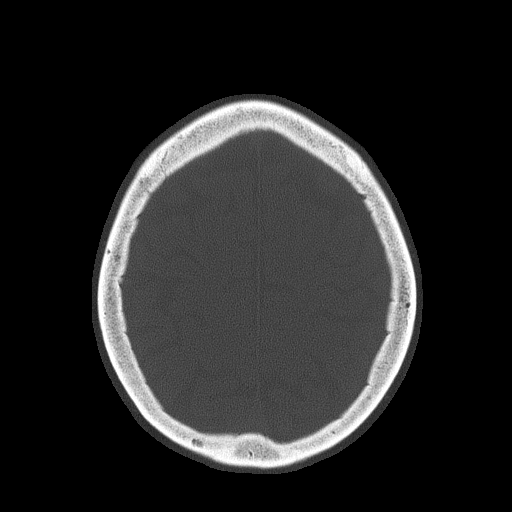
[im 57/71  bone]
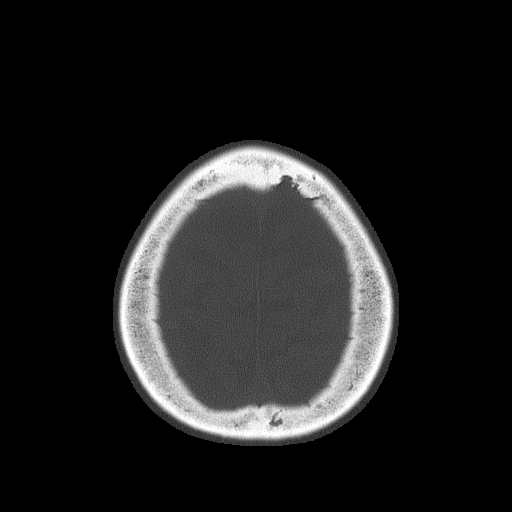
[im 64/71  bone]
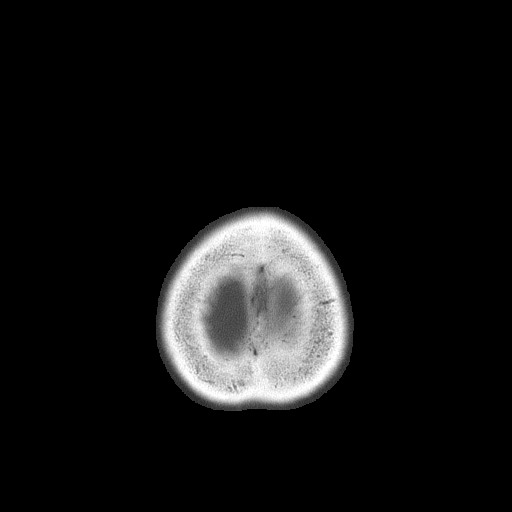

[15 of 30 positions shown; findings below may reference images not displayed]

FINDINGS: Ventricles are stable in size and configuration. All areas of the
brain demonstrate normal gray-white matter attenuation. There is no
mass, hemorrhage, edema or other evidence of acute parenchymal
abnormality. No extra-axial hemorrhage.

Superficial soft tissues are unremarkable. No osseous fracture or
dislocation. Visualized upper paranasal sinuses are clear. Mastoid
air cells are clear.
IMPRESSION: Negative head CT.

## 2016-11-05 DIAGNOSIS — I071 Rheumatic tricuspid insufficiency: Secondary | ICD-10-CM | POA: Diagnosis not present

## 2016-11-05 DIAGNOSIS — M1990 Unspecified osteoarthritis, unspecified site: Secondary | ICD-10-CM | POA: Diagnosis not present

## 2016-11-05 DIAGNOSIS — I2510 Atherosclerotic heart disease of native coronary artery without angina pectoris: Secondary | ICD-10-CM | POA: Diagnosis not present

## 2016-11-05 DIAGNOSIS — E785 Hyperlipidemia, unspecified: Secondary | ICD-10-CM | POA: Diagnosis not present

## 2016-11-05 DIAGNOSIS — E039 Hypothyroidism, unspecified: Secondary | ICD-10-CM | POA: Diagnosis not present

## 2016-11-05 DIAGNOSIS — Z79899 Other long term (current) drug therapy: Secondary | ICD-10-CM | POA: Diagnosis not present

## 2016-11-05 DIAGNOSIS — R739 Hyperglycemia, unspecified: Secondary | ICD-10-CM | POA: Diagnosis not present

## 2016-11-05 DIAGNOSIS — I6523 Occlusion and stenosis of bilateral carotid arteries: Secondary | ICD-10-CM | POA: Diagnosis not present

## 2016-11-15 ENCOUNTER — Encounter: Payer: Self-pay | Admitting: Internal Medicine

## 2016-11-17 NOTE — Progress Notes (Signed)
Cardiology Office Note   Date:  11/18/2016   ID:  [name] [name], DOB 11/12/1933, MRN [id number]  PCP:  Antony Blackbird, MD  Cardiologist:   Dorris Carnes, MD   F?U of HTN, CAD, afib    History of Present Illness: [name] [name] is a 82 y.o. female with a history of HTN and CAD and atrial fibrillation  I sawa him in March 2017    Occasional dizzienss  Had spell on Saturday  Lasted about 3 to 4 hours   No presyncope    Spells happening intermittent  Cares for husband    Outpatient Medications Prior to Visit  Medication Sig Dispense Refill  . acetaminophen (TYLENOL) 500 MG tablet Take 500 mg by mouth every 6 (six) hours as needed for moderate pain.     Marland Kitchen apixaban (ELIQUIS) 5 MG TABS tablet Take 1 tablet (5 mg total) by mouth 2 (two) times daily. 180 tablet 0  . atorvastatin (LIPITOR) 20 MG tablet Take 1 tablet (20 mg total) by mouth daily. 90 tablet 3  . gabapentin (NEURONTIN) 100 MG capsule TAKE 2 CAPSULES (200 MG TOTAL) BY MOUTH AT BEDTIME. 60 capsule 5  . valsartan (DIOVAN) 80 MG tablet Take 80 mg by mouth 2 (two) times daily.    . Vitamin D, Ergocalciferol, (DRISDOL) 50000 units CAPS capsule Take 1 capsule (50,000 Units total) by mouth every 7 (seven) days. 12 capsule 0  . atenolol (TENORMIN) 50 MG tablet Take 25 mg by mouth 2 (two) times daily.     Marland Kitchen gabapentin (NEURONTIN) 300 MG capsule Take 1 capsule (300 mg total) by mouth at bedtime. (Patient not taking: Reported on 11/18/2016) 30 capsule 3  . loratadine (CLARITIN) 10 MG tablet Take 10 mg by mouth daily.    . nitroGLYCERIN (NITROSTAT) 0.4 MG SL tablet Place 1 tablet (0.4 mg total) under the tongue every 5 (five) minutes as needed for chest pain. (Patient not taking: Reported on 11/18/2016) 25 tablet 3  . tiZANidine (ZANAFLEX) 2 MG tablet TAKE (1) TABLET EVERY EIGHT HOURS AS NEEDED FOR NECK PAIN (Patient not taking: Reported on 11/18/2016) 30 tablet 1  . verapamil (COVERA HS) 180 MG (CO) 24 hr tablet Take 180 mg by mouth at bedtime.     . Vitamin D, Ergocalciferol, (DRISDOL) 50000 units CAPS capsule Take 1 capsule (50,000 Units total) by mouth every 7 (seven) days. (Patient not taking: Reported on 11/18/2016) 12 capsule 0   No facility-administered medications prior to visit.      Allergies:   Isosorbide dinitrate; Prednisone; Amoxicillin; Sudafed [pseudoephedrine]; Aspirin; Amoxicillin-pot clavulanate; Cephalexin; Percocet [oxycodone-acetaminophen]; Sulfa antibiotics; and Sulfonamide derivatives   Past Medical History:  Diagnosis Date  . Allergic rhinitis, cause unspecified   . Anxiety and depression   . Chronic headaches   . Coronary artery disease   . DJD (degenerative joint disease)   . Family history of malignant neoplasm of gastrointestinal tract   . GERD (gastroesophageal reflux disease)   . H/O: hysterectomy   . Hyperlipidemia   . Hypertension   . Hypothyroidism   . Orthostatic hypotension   . Osteoarthrosis, unspecified whether generalized or localized, unspecified site   . Osteoporosis   . Unspecified functional disorder of intestine     Past Surgical History:  Procedure Laterality Date  . ABDOMINAL HYSTERECTOMY    . APPENDECTOMY    . CARDIAC CATHETERIZATION    . CATARACT EXTRACTION W/ INTRAOCULAR LENS IMPLANT Bilateral   . CERVICAL SPINE SURGERY    .  cyst removal on back    . GANGLION CYST EXCISION Bilateral   . KNEE SURGERY Left   . NASAL SINUS SURGERY    . TONSILLECTOMY    . TOTAL KNEE ARTHROPLASTY Right      Social History:  The patient  reports that she has never smoked. She has never used smokeless tobacco. She reports that she does not drink alcohol or use drugs.   Family History:  The patient's family history includes Brain cancer in her other; Cancer in her mother; Colon cancer in her mother; Heart attack (age of onset: 30) in her father; Heart disease in her father, maternal aunt, and maternal uncle; Varicose Veins in her mother.    ROS:  Please see the history of present  illness. All other systems are reviewed and  Negative to the above problem except as noted.    PHYSICAL EXAM: VS:  BP 140/82   Pulse 62   Ht 5\' 5"  (1.651 m)   Wt 143 lb 9.6 oz (65.1 kg)   BMI 23.90 kg/m   GEN: Well nourished, well developed, in no acute distress HEENT: normal Neck: no JVD, carotid bruits, or masses Cardiac: RRR; no murmurs, rubs, or gallops,no edema  Respiratory:  clear to auscultation bilaterally, normal work of breathing GI: soft, nontender, nondistended, + BS  No hepatomegaly  MS: no deformity Moving all extremities   Skin: warm and dry, no rash Neuro:  Strength and sensation are intact Psych: euthymic mood, full affect   EKG:  EKG is not  ordered today.   Lipid Panel    Component Value Date/Time   CHOL 227 (H) 05/29/2016 0828   TRIG 97 05/29/2016 0828   TRIG 128 12/26/2006 1227   HDL 82 05/29/2016 0828   CHOLHDL 2.8 05/29/2016 0828   VLDL 19 05/29/2016 0828   LDLCALC 126 05/29/2016 0828      Wt Readings from Last 3 Encounters:  11/18/16 143 lb 9.6 oz (65.1 kg)  10/31/16 147 lb (66.7 kg)  10/09/16 145 lb (65.8 kg)      ASSESSMENT AND PLAN:  1  HTN  BP is ok    2  Dizzienss  ? afib with RVR  Told her to check pulse  ? Orthostatic  Stay hydrated    3  Atrial fib  Continue meds  Check pulse when dizziens   4  HL   Lipids today   5  CAD  Mild plaquing    Check BMET, CBC today along with lipids    Current medicines are reviewed at length with the patient today.  The patient does not have concerns regarding medicines.  Signed, Dorris Carnes, MD  11/18/2016 9:42 AM    [facility] [place], [place], [place]  [address] Phone: [phone]; Fax: [phone]

## 2016-11-18 ENCOUNTER — Encounter: Payer: Self-pay | Admitting: Internal Medicine

## 2016-11-18 ENCOUNTER — Ambulatory Visit (INDEPENDENT_AMBULATORY_CARE_PROVIDER_SITE_OTHER): Payer: Medicare Other | Admitting: Internal Medicine

## 2016-11-18 VITALS — BP 140/82 | HR 62 | Ht 65.0 in | Wt 143.6 lb

## 2016-11-18 DIAGNOSIS — E785 Hyperlipidemia, unspecified: Secondary | ICD-10-CM

## 2016-11-18 DIAGNOSIS — I10 Essential (primary) hypertension: Secondary | ICD-10-CM

## 2016-11-18 LAB — CBC
HCT: 34.8 % — ABNORMAL LOW (ref 35.0–45.0)
Hemoglobin: 11.2 g/dL — ABNORMAL LOW (ref 11.7–15.5)
MCH: 30.1 pg (ref 27.0–33.0)
MCHC: 32.2 g/dL (ref 32.0–36.0)
MCV: 93.5 fL (ref 80.0–100.0)
MPV: 10.6 fL (ref 7.5–12.5)
PLATELETS: 272 10*3/uL (ref 140–400)
RBC: 3.72 MIL/uL — ABNORMAL LOW (ref 3.80–5.10)
RDW: 13.2 % (ref 11.0–15.0)
WBC: 7.3 10*3/uL (ref 3.8–10.8)

## 2016-11-18 LAB — LIPID PANEL
CHOL/HDL RATIO: 3.3 ratio (ref <5.0)
CHOLESTEROL: 148 mg/dL (ref <200)
HDL: 45 mg/dL — ABNORMAL LOW (ref >50)
LDL CALC: 78 mg/dL (ref <100)
Triglycerides: 124 mg/dL (ref <150)
VLDL: 25 mg/dL (ref <30)

## 2016-11-18 LAB — BASIC METABOLIC PANEL
BUN: 19 mg/dL (ref 7–25)
CALCIUM: 8.8 mg/dL (ref 8.6–10.4)
CHLORIDE: 100 mmol/L (ref 98–110)
CO2: 29 mmol/L (ref 20–31)
CREATININE: 0.93 mg/dL — AB (ref 0.60–0.88)
Glucose, Bld: 88 mg/dL (ref 65–99)
Potassium: 4.1 mmol/L (ref 3.5–5.3)
Sodium: 136 mmol/L (ref 135–146)

## 2016-11-18 NOTE — Patient Instructions (Signed)
Your physician recommends that you continue on your current medications as directed. Please refer to the Current Medication list given to you today.  Your physician recommends that you return for lab work today (BMET, CBC, [place])  Your physician wants you to follow-up in: June 2018 [place].  You will receive a reminder letter in the mail two months in advance. If you don't receive a letter, please call our office to schedule the follow-up appointment.

## 2016-11-20 ENCOUNTER — Telehealth: Payer: Self-pay | Admitting: *Deleted

## 2016-11-20 DIAGNOSIS — E785 Hyperlipidemia, unspecified: Secondary | ICD-10-CM

## 2016-11-20 MED ORDER — ATORVASTATIN CALCIUM 40 MG PO TABS: 40.0000 mg | ORAL_TABLET | Freq: Every day | ORAL | 3 refills | Status: DC

## 2016-11-20 NOTE — Telephone Encounter (Signed)
-----   Message from Fay Records, MD sent at 11/19/2016  2:03 PM EST ----- LDL is 78  Would be better to be lower  I would increase lipitor to 40  Repeat lipids in 8 wks  Hgb is minimally decreased  REpeat CBC in 8 wks when check other labs   electorlytes and kidney function are OK

## 2016-12-02 DIAGNOSIS — Z1231 Encounter for screening mammogram for malignant neoplasm of breast: Secondary | ICD-10-CM | POA: Diagnosis not present

## 2016-12-22 ENCOUNTER — Other Ambulatory Visit: Payer: Self-pay | Admitting: Family Medicine

## 2016-12-24 NOTE — Telephone Encounter (Signed)
Refill done.  

## 2016-12-27 ENCOUNTER — Telehealth: Payer: Self-pay | Admitting: Family Medicine

## 2016-12-27 NOTE — Telephone Encounter (Signed)
Patient is scheduled to be seen on 1/15 for left knee pain.  If there is an opening sooner she would like to come in.

## 2016-12-31 NOTE — Telephone Encounter (Signed)
Spoke to pt, scheduled her tomorrow at 315p.

## 2016-12-31 NOTE — Progress Notes (Signed)
[name] [place], [place] [address] Phone: [phone] Subjective:    CC: Left knee pain follow up Right hip pain  [id number]  [name] [name] is a 83 y.o. female coming in with complaint of Left knee follow-up Severe arthritic changes of the knee.  Also complaining of left knee pain.Failed all conservative therapy. Patient is Here for fourth and final injection in the series. Patient states started having pain in both knees. Still worse in the left knee. Patient states and is having increasing discomfort and pain. Maybe some mild swelling. Stated and affect daily activities. Seems to be back to the amount pain that she had prior to the viscous supplementation.  Patient is also complaining of right hip pain. Lateral aspect. Seems to stay localized. Waking her up at night. Worse after activity when she's tries to start activity. Denies any radiation down the leg. Denies any increasing weakness but has had difficulty with weakness previously. No recent falls or injuries. Rates the severity of pain a 7 out of 10     Past Medical History:  Diagnosis Date  . Allergic rhinitis, cause unspecified   . Anxiety and depression   . Chronic headaches   . Coronary artery disease   . DJD (degenerative joint disease)   . Family history of malignant neoplasm of gastrointestinal tract   . GERD (gastroesophageal reflux disease)   . H/O: hysterectomy   . Hyperlipidemia   . Hypertension   . Hypothyroidism   . Orthostatic hypotension   . Osteoarthrosis, unspecified whether generalized or localized, unspecified site   . Osteoporosis   . Unspecified functional disorder of intestine    Past Surgical History:  Procedure Laterality Date  . ABDOMINAL HYSTERECTOMY    . APPENDECTOMY    . CARDIAC CATHETERIZATION    . CATARACT EXTRACTION W/ INTRAOCULAR LENS IMPLANT Bilateral   . CERVICAL SPINE SURGERY    . cyst removal on back    . GANGLION CYST EXCISION  Bilateral   . KNEE SURGERY Left   . NASAL SINUS SURGERY    . TONSILLECTOMY    . TOTAL KNEE ARTHROPLASTY Right    Social History   Social History  . Marital status: Married    Spouse name: N/A  . Number of children: 0  . Years of education: N/A   Occupational History  . retired Retired   Social History Main Topics  . Smoking status: Never Smoker  . Smokeless tobacco: Never Used     Comment: was a passive smoker for 15 yrs  . Alcohol use No  . Drug use: No  . Sexual activity: Not Currently   Other Topics Concern  . None   Social History Narrative  . None   Allergies  Allergen Reactions  . Isosorbide Dinitrate Other (See Comments)    Other reaction(s): severe headaches  . Prednisone Other (See Comments)    Heart palpitations  . Amoxicillin Swelling    Lip/throat swelling  . Sudafed [Pseudoephedrine] Other (See Comments)    Other reaction(s): rash  . Aspirin Other (See Comments)    Baby aspirin is tolerated, itching in the past  . Amoxicillin-Pot Clavulanate Swelling and Other (See Comments)    Other reaction(s): Facial and tounge swelling  . Cephalexin Other (See Comments)  . Percocet [Oxycodone-Acetaminophen] Other (See Comments)  . Sulfa Antibiotics Other (See Comments)  . Sulfonamide Derivatives Other (See Comments)    Unknown reaction a long time ago   Family  History  Problem Relation Age of Onset  . Colon cancer Mother   . Cancer Mother   . Varicose Veins Mother   . Heart attack Father 33  . Heart disease Father   . Brain cancer Other     1st degree relative  . Heart disease Maternal Uncle     x 4  . Heart disease Maternal Aunt     x 1  . Pancreatic cancer Neg Hx   . Prostate cancer Neg Hx   . Stomach cancer Neg Hx     pt notes that mother says she had "part of her stomach removed"  . Liver disease Neg Hx   . Kidney disease Neg Hx     Past medical history, social, surgical and family history all reviewed in electronic medical record.  No  pertanent information unless stated regarding to the chief complaint.   Review of Systems: No headache, visual changes, nausea, vomiting, diarrhea, constipation, dizziness, abdominal pain, skin rash, fevers, chills, night sweats, weight loss, swollen lymph nodes, , chest pain, shortness of breath, mood changes.   Objective  Blood pressure 128/78, pulse 63, height 5\' 5"  (1.651 m), weight 142 lb (64.4 kg), SpO2 93 %.  Systems examined below as of 01/01/17 General: NAD A&O x3 mood, affect normal  HEENT: Pupils equal, extraocular movements intact no nystagmus Respiratory: not short of breath at rest or with speaking Cardiovascular: No lower extremity edema, non tender Skin: Warm dry intact with no signs of infection or rash on extremities or on axial skeleton. Abdomen: Soft nontender, no masses Neuro: Cranial nerves  intact, neurovascularly intact in all extremities with 2+ DTRs and 2+ pulses. Lymph: No lymphadenopathy appreciated today  Gait mild improvement in gait MSK:  Non tender with full range of motion and good stability and symmetric strength and tone of shoulders, elbows, wrist, and ankles bilaterally. Significant arthritic changes of the hands and other joints. Patient does have a right-sided knee replacement   Left knee exam shows the patient does have osteophytic changes. Instability with valgus force. Worsening pain over the medial aspect  Moore increase in crepitus  Right hip exam shows pain overlying greater trochanter area. Mild limitation in internal rotation. Negative straight leg test. Mild positive Faber test. Mild discomfort over the right sacroiliac joint  After informed written and verbal consent, patient was seated on exam table. Left knee was prepped with alcohol swab and utilizing anterolateral approach, patient's left knee space was injected with 4:1  marcaine 0.5%: Kenalog 40mg /dL. Patient tolerated the procedure well without immediate complications.   Procedure:  Real-time Ultrasound Guided Injection of right greater trochanteric bursitis secondary to patient's body habitus Device: GE Logiq E  Ultrasound guided injection is preferred based studies that show increased duration, increased effect, greater accuracy, decreased procedural pain, increased response rate, and decreased cost with ultrasound guided versus blind injection.  Verbal informed consent obtained.  Time-out conducted.  Noted no overlying erythema, induration, or other signs of local infection.  Skin prepped in a sterile fashion.  Local anesthesia: Topical Ethyl chloride.  With sterile technique and under real time ultrasound guidance:  Greater trochanteric area was visualized and patient's bursa was noted. A 22-gauge 3 inch needle was inserted and 4 cc of 0.5% Marcaine and 1 cc of Kenalog 40 mg/dL was injected. Pictures taken Completed without difficulty  Pain immediately resolved suggesting accurate placement of the medication.  Advised to call if fevers/chills, erythema, induration, drainage, or persistent bleeding.  Images permanently stored and  available for review in the ultrasound unit.  Impression: Technically successful ultrasound guided injection.   Impression and Recommendations:     This case required medical decision making of moderate complexity.

## 2017-01-01 ENCOUNTER — Ambulatory Visit: Payer: Self-pay

## 2017-01-01 ENCOUNTER — Ambulatory Visit (INDEPENDENT_AMBULATORY_CARE_PROVIDER_SITE_OTHER): Payer: Medicare Other | Admitting: Family Medicine

## 2017-01-01 ENCOUNTER — Encounter: Payer: Self-pay | Admitting: Family Medicine

## 2017-01-01 VITALS — BP 128/78 | HR 63 | Ht 65.0 in | Wt 142.0 lb

## 2017-01-01 DIAGNOSIS — M7061 Trochanteric bursitis, right hip: Secondary | ICD-10-CM

## 2017-01-01 DIAGNOSIS — M25551 Pain in right hip: Secondary | ICD-10-CM | POA: Diagnosis not present

## 2017-01-01 DIAGNOSIS — M5416 Radiculopathy, lumbar region: Secondary | ICD-10-CM

## 2017-01-01 DIAGNOSIS — M1712 Unilateral primary osteoarthritis, left knee: Secondary | ICD-10-CM | POA: Diagnosis not present

## 2017-01-01 NOTE — Assessment & Plan Note (Signed)
Given injection today. Tolerated the procedure well. We discussed icing regimen and home exercises. Encourage her to wear the brace, regular basis. Follow-up again in 10 weeks for further evaluation.

## 2017-01-01 NOTE — Patient Instructions (Addendum)
Good to see you as always Happy New Year! The weather will not be your friend for the next moth or 2  I hope the injection calms things down for some time.  We can repeat steroid injection in 10 weeks or we can do orthovisc again in April.  See you again when you need me. If it does not get better we may need to look at your back.

## 2017-01-01 NOTE — Assessment & Plan Note (Signed)
Patient given injection and tolerated the procedure well. We discussed the icing regimen and home exercises again. We discussed the differential includes a lumbar radiculopathy which she's had previously. Encourage her to take the gabapentin on a more regular basis. Patient will follow-up with me again in 2 weeks if no improvement otherwise will see me as needed.

## 2017-01-01 NOTE — Assessment & Plan Note (Signed)
We will monitor 

## 2017-01-13 ENCOUNTER — Ambulatory Visit: Payer: Medicare Other | Admitting: Family Medicine

## 2017-01-15 ENCOUNTER — Other Ambulatory Visit: Payer: Medicare Other

## 2017-01-21 ENCOUNTER — Other Ambulatory Visit: Payer: Medicare Other | Admitting: *Deleted

## 2017-01-21 DIAGNOSIS — K625 Hemorrhage of anus and rectum: Secondary | ICD-10-CM | POA: Diagnosis not present

## 2017-01-21 DIAGNOSIS — E7800 Pure hypercholesterolemia, unspecified: Secondary | ICD-10-CM

## 2017-01-21 DIAGNOSIS — I10 Essential (primary) hypertension: Secondary | ICD-10-CM

## 2017-01-21 DIAGNOSIS — I2510 Atherosclerotic heart disease of native coronary artery without angina pectoris: Secondary | ICD-10-CM

## 2017-01-22 ENCOUNTER — Other Ambulatory Visit: Payer: Self-pay | Admitting: *Deleted

## 2017-01-22 DIAGNOSIS — E785 Hyperlipidemia, unspecified: Secondary | ICD-10-CM

## 2017-01-22 LAB — CBC WITH DIFFERENTIAL/PLATELET
Basophils Absolute: 0 10*3/uL (ref 0.0–0.2)
Basos: 1 %
EOS (ABSOLUTE): 0.1 10*3/uL (ref 0.0–0.4)
EOS: 2 %
HEMATOCRIT: 34.8 % (ref 34.0–46.6)
HEMOGLOBIN: 11.1 g/dL (ref 11.1–15.9)
IMMATURE GRANS (ABS): 0 10*3/uL (ref 0.0–0.1)
Immature Granulocytes: 0 %
LYMPHS ABS: 1.6 10*3/uL (ref 0.7–3.1)
LYMPHS: 25 %
MCH: 28.2 pg (ref 26.6–33.0)
MCHC: 31.9 g/dL (ref 31.5–35.7)
MCV: 88 fL (ref 79–97)
MONOCYTES: 9 %
Monocytes Absolute: 0.6 10*3/uL (ref 0.1–0.9)
Neutrophils Absolute: 4 10*3/uL (ref 1.4–7.0)
Neutrophils: 63 %
Platelets: 278 10*3/uL (ref 150–379)
RBC: 3.94 x10E6/uL (ref 3.77–5.28)
RDW: 14.1 % (ref 12.3–15.4)
WBC: 6.2 10*3/uL (ref 3.4–10.8)

## 2017-01-22 LAB — LIPID PANEL
CHOL/HDL RATIO: 3 ratio (ref 0.0–4.4)
Cholesterol, Total: 197 mg/dL (ref 100–199)
HDL: 65 mg/dL (ref >39)
LDL CALC: 121 mg/dL — AB (ref 0–99)
TRIGLYCERIDES: 54 mg/dL (ref 0–149)
VLDL Cholesterol Cal: 11 mg/dL (ref 5–40)

## 2017-01-22 MED ORDER — ATORVASTATIN CALCIUM 80 MG PO TABS: 80.0000 mg | ORAL_TABLET | Freq: Every day | ORAL | 3 refills | Status: DC

## 2017-01-23 ENCOUNTER — Other Ambulatory Visit: Payer: Self-pay

## 2017-01-23 NOTE — Telephone Encounter (Signed)
Refills already on file. Call  [phone] to have RX moved if patient doesn't want [facility] to fill  atorvastatin (LIPITOR) 80 MG tablet  Medication  Date: 01/22/2017 Department: [facility] [place] Office Ordering/Authorizing: Fay Records, MD  Order Providers   Prescribing Provider Encounter Provider  Fay Records, MD Rodman Key, RN  Medication Detail    Disp Refills Start End   atorvastatin (LIPITOR) 80 MG tablet 90 tablet 3 01/22/2017    Sig - Route: Take 1 tablet (80 mg total) by mouth daily. - Oral   E-Prescribing Status: Receipt confirmed by pharmacy (01/22/2017 2:50 PM EST)   Pharmacy   [id number] [place], [place] Encounter  Priority and Order Details

## 2017-01-27 NOTE — Progress Notes (Signed)
[name] [place], [place] [address] Phone: [phone] Subjective:   f:    CC: Right leg pain  [id number]  [name] [name] is a 83 y.o. female coming in with complaint of right leg pain. Past medical history is significant for a lumbar radiculopathy as well as a greater trochanteric bursitis with last injection 01/01/2017. Patient states  Unfortunate she was only better for one week. Starting have increasing pain again. Still lateral and mild radiation don the leg. Denies any numbness though. Patient states that this is severe enough that is waking her up at night. Making it difficult to walk. No groin pain associated with it. Patient denies any weakness. States that the pain that seems to be constant and almost unbearable. Possibly worse than usual.  Left knee pain is significant better since last injection 3 weeks.   Patient has had x-rays of her lower back previously and has had moderate to severe degenerative disc disease at L2-L3.  Past Medical History:  Diagnosis Date  . Allergic rhinitis, cause unspecified   . Anxiety and depression   . Chronic headaches   . Coronary artery disease   . DJD (degenerative joint disease)   . Family history of malignant neoplasm of gastrointestinal tract   . GERD (gastroesophageal reflux disease)   . H/O: hysterectomy   . Hyperlipidemia   . Hypertension   . Hypothyroidism   . Orthostatic hypotension   . Osteoarthrosis, unspecified whether generalized or localized, unspecified site   . Osteoporosis   . Unspecified functional disorder of intestine    Past Surgical History:  Procedure Laterality Date  . ABDOMINAL HYSTERECTOMY    . APPENDECTOMY    . CARDIAC CATHETERIZATION    . CATARACT EXTRACTION W/ INTRAOCULAR LENS IMPLANT Bilateral   . CERVICAL SPINE SURGERY    . cyst removal on back    . GANGLION CYST EXCISION Bilateral   . KNEE SURGERY Left   . NASAL SINUS SURGERY    . TONSILLECTOMY      . TOTAL KNEE ARTHROPLASTY Right    Social History   Social History  . Marital status: Married    Spouse name: N/A  . Number of children: 0  . Years of education: N/A   Occupational History  . retired Retired   Social History Main Topics  . Smoking status: Never Smoker  . Smokeless tobacco: Never Used     Comment: was a passive smoker for 15 yrs  . Alcohol use No  . Drug use: No  . Sexual activity: Not Currently   Other Topics Concern  . None   Social History Narrative  . None   Allergies  Allergen Reactions  . Isosorbide Dinitrate Other (See Comments)    Other reaction(s): severe headaches  . Prednisone Other (See Comments)    Heart palpitations  . Amoxicillin Swelling    Lip/throat swelling  . Sudafed [Pseudoephedrine] Other (See Comments)    Other reaction(s): rash  . Aspirin Other (See Comments)    Baby aspirin is tolerated, itching in the past  . Amoxicillin-Pot Clavulanate Swelling and Other (See Comments)    Other reaction(s): Facial and tounge swelling  . Cephalexin Other (See Comments)  . Percocet [Oxycodone-Acetaminophen] Other (See Comments)  . Sulfa Antibiotics Other (See Comments)  . Sulfonamide Derivatives Other (See Comments)    Unknown reaction a long time ago   Family History  Problem Relation Age of Onset  . Colon cancer Mother   .  Cancer Mother   . Varicose Veins Mother   . Heart attack Father 50  . Heart disease Father   . Brain cancer Other     1st degree relative  . Heart disease Maternal Uncle     x 4  . Heart disease Maternal Aunt     x 1  . Pancreatic cancer Neg Hx   . Prostate cancer Neg Hx   . Stomach cancer Neg Hx     pt notes that mother says she had "part of her stomach removed"  . Liver disease Neg Hx   . Kidney disease Neg Hx     Past medical history, social, surgical and family history all reviewed in electronic medical record.  No pertanent information unless stated regarding to the chief complaint.   Review of  Systems: No headache, visual changes, nausea, vomiting, diarrhea, constipation, dizziness, abdominal pain, skin rash, fevers, chills, night sweats, weight loss, swollen lymph nodes chest pain, shortness of breath, mood changes.  Positive increasing muscle aches  Objective  Blood pressure 114/80, pulse (!) 51, height 5\' 5"  (1.651 m), weight 137 lb (62.1 kg), SpO2 98 %.   Systems examined below as of 01/28/17 General: NAD A&O x3 mood, affect normal  frail HEENT: Pupils equal, extraocular movements intact no nystagmus Respiratory: not short of breath at rest or with speaking Cardiovascular: No lower extremity edema, non tender Skin: Warm dry intact with no signs of infection or rash on extremities or on axial skeleton. Abdomen: Soft nontender, no masses Neuro: Cranial nerves  intact, neurovascularly intact in all extremities with 2+ DTRs and 2+ pulses. Lymph: No lymphadenopathy appreciated today  Gait antalgic gait worse then usual.  MSK: Non tender with full range of motion and good stability and symmetric strength and tone of shoulders, elbows, wrist,  and ankles bilaterally arthritic changes .   Hip exam mild pain power the lateral aspect. Positive slt. On left side.  Patient has full strength distally. No swelling noted. Neurovascularly intact. Patient's left knee significantly better. Minimal tenderness to palpation. Continues to have advanced putting up.  Impression and Recommendations:     This case required medical decision making of moderate complexity.      Note: This dictation was prepared with Dragon dictation along with smaller phrase technology. Any transcriptional errors that result from this process are unintentional.

## 2017-01-28 ENCOUNTER — Ambulatory Visit (INDEPENDENT_AMBULATORY_CARE_PROVIDER_SITE_OTHER)
Admission: RE | Admit: 2017-01-28 | Discharge: 2017-01-28 | Disposition: A | Discharge status: Home / Self Care | Payer: Medicare Other | Source: Ambulatory Visit | Attending: Family Medicine | Admitting: Family Medicine

## 2017-01-28 ENCOUNTER — Encounter: Payer: Self-pay | Admitting: Family Medicine

## 2017-01-28 ENCOUNTER — Ambulatory Visit (INDEPENDENT_AMBULATORY_CARE_PROVIDER_SITE_OTHER): Payer: Medicare Other | Admitting: Family Medicine

## 2017-01-28 ENCOUNTER — Other Ambulatory Visit: Payer: Self-pay | Admitting: Family Medicine

## 2017-01-28 VITALS — BP 114/80 | HR 51 | Ht 65.0 in | Wt 137.0 lb

## 2017-01-28 DIAGNOSIS — M25551 Pain in right hip: Secondary | ICD-10-CM

## 2017-01-28 DIAGNOSIS — S79911A Unspecified injury of right hip, initial encounter: Secondary | ICD-10-CM | POA: Diagnosis not present

## 2017-01-28 DIAGNOSIS — I2510 Atherosclerotic heart disease of native coronary artery without angina pectoris: Secondary | ICD-10-CM | POA: Diagnosis not present

## 2017-01-28 DIAGNOSIS — M5416 Radiculopathy, lumbar region: Secondary | ICD-10-CM

## 2017-01-28 MED ORDER — KETOROLAC TROMETHAMINE 30 MG/ML IJ SOLN
30.0000 mg | Freq: Once | INTRAMUSCULAR | Status: AC
  Administered 2017-01-28: 30 mg via INTRAMUSCULAR

## 2017-01-28 MED ORDER — METHYLPREDNISOLONE ACETATE 40 MG/ML IJ SUSP
40.0000 mg | Freq: Once | INTRAMUSCULAR | Status: AC
  Administered 2017-01-28: 40 mg via INTRAMUSCULAR

## 2017-01-28 NOTE — Patient Instructions (Signed)
Good to see you  I think it is coming from your back.  Ice is your friend and would do it 10 minutes a couple times a day  Stay active but find time for yourself.  Gabapentin 300mg  at night See me again in 7-10 days. If not better we will need mri of your back  Also get xray of hip downstairs

## 2017-01-28 NOTE — Assessment & Plan Note (Signed)
Worsening symptoms. With patient not responding to the lateral injections previously and I am concerned the patient is having more of a lumbar radiculopathy. Patient given 2 injections today. We discussed icing regimen and home exercises. We discussed increasing the gabapentin to 300 mg at night. Patient continued to be active otherwise. Follow-up again in 7-10 days. Worsening symptoms we'll need an MRI.

## 2017-02-01 ENCOUNTER — Other Ambulatory Visit: Payer: Self-pay | Admitting: Family Medicine

## 2017-02-03 ENCOUNTER — Ambulatory Visit: Payer: Medicare Other | Admitting: Family Medicine

## 2017-02-03 NOTE — Telephone Encounter (Signed)
Refill done.  

## 2017-02-04 ENCOUNTER — Ambulatory Visit (INDEPENDENT_AMBULATORY_CARE_PROVIDER_SITE_OTHER): Payer: Medicare Other | Admitting: Family Medicine

## 2017-02-04 ENCOUNTER — Encounter: Payer: Self-pay | Admitting: Family Medicine

## 2017-02-04 DIAGNOSIS — M5416 Radiculopathy, lumbar region: Secondary | ICD-10-CM

## 2017-02-04 DIAGNOSIS — I2510 Atherosclerotic heart disease of native coronary artery without angina pectoris: Secondary | ICD-10-CM | POA: Diagnosis not present

## 2017-02-04 NOTE — Patient Instructions (Addendum)
Good to see you  [name] [name] is your friend.  Stay active.  Continue the gabapentin.  Lets check in again in 3 weeks.  If worsening symptoms then would consider MRI and just call.

## 2017-02-04 NOTE — Assessment & Plan Note (Signed)
Improving slowly.  Continue conservative therapy worsening symptoms we will need MRI. Patient will continue to monitor. Still has some mild weakness on the right leg compared to contralateral side.

## 2017-02-04 NOTE — Progress Notes (Signed)
[name] [place], [place] [address] Phone: [phone] Subjective:   f:    CC: Right leg pain f/u  [id number]  [name] [name] is a 83 y.o. female coming in with complaint of right leg pain. Past medical history is significant for a lumbar radiculopathy as well as a greater trochanteric bursitis with last injection 01/01/2017. States though that unfortunately continued to have pain. Seems to be more of a lumbar radiculopathy. Was given prednisone and gabapentin.  Been lifting less. Now 50-60% better . Ice has been helping.    Left knee pain is significant better since last injection 3 weeks.   Patient has had x-rays of her lower back previously and has had moderate to severe degenerative disc disease at L2-L3.  Past Medical History:  Diagnosis Date  . Allergic rhinitis, cause unspecified   . Anxiety and depression   . Chronic headaches   . Coronary artery disease   . DJD (degenerative joint disease)   . Family history of malignant neoplasm of gastrointestinal tract   . GERD (gastroesophageal reflux disease)   . H/O: hysterectomy   . Hyperlipidemia   . Hypertension   . Hypothyroidism   . Orthostatic hypotension   . Osteoarthrosis, unspecified whether generalized or localized, unspecified site   . Osteoporosis   . Unspecified functional disorder of intestine    Past Surgical History:  Procedure Laterality Date  . ABDOMINAL HYSTERECTOMY    . APPENDECTOMY    . CARDIAC CATHETERIZATION    . CATARACT EXTRACTION W/ INTRAOCULAR LENS IMPLANT Bilateral   . CERVICAL SPINE SURGERY    . cyst removal on back    . GANGLION CYST EXCISION Bilateral   . KNEE SURGERY Left   . NASAL SINUS SURGERY    . TONSILLECTOMY    . TOTAL KNEE ARTHROPLASTY Right    Social History   Social History  . Marital status: Married    Spouse name: N/A  . Number of children: 0  . Years of education: N/A   Occupational History  . retired Retired    Social History Main Topics  . Smoking status: Never Smoker  . Smokeless tobacco: Never Used     Comment: was a passive smoker for 15 yrs  . Alcohol use No  . Drug use: No  . Sexual activity: Not Currently   Other Topics Concern  . None   Social History Narrative  . None   Allergies  Allergen Reactions  . Isosorbide Dinitrate Other (See Comments)    Other reaction(s): severe headaches  . Prednisone Other (See Comments)    Heart palpitations  . Amoxicillin Swelling    Lip/throat swelling  . Sudafed [Pseudoephedrine] Other (See Comments)    Other reaction(s): rash  . Aspirin Other (See Comments)    Baby aspirin is tolerated, itching in the past  . Amoxicillin-Pot Clavulanate Swelling and Other (See Comments)    Other reaction(s): Facial and tounge swelling  . Cephalexin Other (See Comments)  . Percocet [Oxycodone-Acetaminophen] Other (See Comments)  . Sulfa Antibiotics Other (See Comments)  . Sulfonamide Derivatives Other (See Comments)    Unknown reaction a long time ago   Family History  Problem Relation Age of Onset  . Colon cancer Mother   . Cancer Mother   . Varicose Veins Mother   . Heart attack Father 68  . Heart disease Father   . Brain cancer Other     1st degree relative  . Heart  disease Maternal Uncle     x 4  . Heart disease Maternal Aunt     x 1  . Pancreatic cancer Neg Hx   . Prostate cancer Neg Hx   . Stomach cancer Neg Hx     pt notes that mother says she had "part of her stomach removed"  . Liver disease Neg Hx   . Kidney disease Neg Hx     Past medical history, social, surgical and family history all reviewed in electronic medical record.  No pertanent information unless stated regarding to the chief complaint.   Review of Systems: No headache, visual changes, nausea, vomiting, diarrhea, constipation, dizziness, abdominal pain, skin rash, fevers, chills, night sweats, weight loss, swollen lymph nodes, chest pain, shortness of breath, mood  changes.    Objective  Blood pressure 124/78, height 5\' 5"  (1.651 m).   Systems examined below as of 02/04/17 General: NAD A&O x3 mood, affect normal  HEENT: Pupils equal, extraocular movements intact no nystagmus Respiratory: not short of breath at rest or with speaking Cardiovascular: No lower extremity edema, non tender Skin: Warm dry intact with no signs of infection or rash on extremities or on axial skeleton. Abdomen: Soft nontender, no masses Neuro: Cranial nerves  intact, neurovascularly intact in all extremities with 2+ DTRs and 2+ pulses. Lymph: No lymphadenopathy appreciated today  Gait normal with good balance and coordination.  MSK: Non tender with full range of motion and good stability and symmetric strength and tone of shoulders, elbows, wrist,  knee hips and ankles bilaterally.  Gait antalgic gait worse then usual.  MSK: Non tender with full range of motion and good stability and symmetric strength and tone of shoulders, elbows, wrist,  and ankles bilaterally arthritic changes .     Back Exam:  Inspection: Unremarkable  Motion: Flexion 35 deg, Extension 15 deg, Side Bending to 35 deg bilaterally,  Rotation to 35 deg bilaterally  SLR laying: positive. Right  XSLR laying: Negative  Palpable tenderness: TTP still on right . FABER: negative. Sensory change: Gross sensation intact to all lumbar and sacral dermatomes.  Reflexes: 2+ at both patellar tendons, 2+ at achilles tendons, Babinski's downgoing.  Strength at foot  4 out of 5 on the right side compared to 5 out of 5 on the left side of the lower extremity.   Impression and Recommendations:     This case required medical decision making of moderate complexity.      Note: This dictation was prepared with Dragon dictation along with smaller phrase technology. Any transcriptional errors that result from this process are unintentional.

## 2017-02-07 DIAGNOSIS — E039 Hypothyroidism, unspecified: Secondary | ICD-10-CM | POA: Diagnosis not present

## 2017-02-07 DIAGNOSIS — R739 Hyperglycemia, unspecified: Secondary | ICD-10-CM | POA: Diagnosis not present

## 2017-02-09 ENCOUNTER — Other Ambulatory Visit: Payer: Self-pay | Admitting: Internal Medicine

## 2017-02-10 NOTE — Telephone Encounter (Signed)
Pt last Dr Harrington Challenger on 11/18/16, labs done 11/18/16 Creat 0.93, 83 y/o, weight 62.1, per specified criteria pt is on appropriate dosage of Eliquis 5mg  BID.  Will refill rx x 6 months.

## 2017-02-26 ENCOUNTER — Ambulatory Visit: Payer: Medicare Other | Admitting: Family Medicine

## 2017-03-02 ENCOUNTER — Other Ambulatory Visit: Payer: Self-pay | Admitting: Family Medicine

## 2017-03-03 NOTE — Telephone Encounter (Signed)
Refill done.  

## 2017-03-11 NOTE — Progress Notes (Signed)
[name] [address] [place], [place] [address] Phone: [phone] Subjective:   f:    CC: Right leg pain f/u  [id number]  [name] [name] is a 83 y.o. female coming in with complaint of right leg pain. Past medical history is significant for a lumbar radiculopathy as well as a greater trochanteric bursitis with last injection 01/01/2017. Patient wanted to continue with conservative therapy at last exam including the gabapentin. Patient was to stay active and do icing regimen. Patient states Having more pain that seems to be specified in the right buttocks area. An states that it hurts more with sitting. Patient denies any worsening of the back pain. Feels that the weakness may have improvement in leg but unfortunately the pain is severely worse.   Left knee pain seems to be stable at the moment. Feels that her back is severe enough in the buttocks pain is severe enough that she is not noticing any pain with the knee   Patient has had x-rays of her lower back previously and has had moderate to severe degenerative disc disease at L2-L3.  Past Medical History:  Diagnosis Date  . Allergic rhinitis, cause unspecified   . Anxiety and depression   . Chronic headaches   . Coronary artery disease   . DJD (degenerative joint disease)   . Family history of malignant neoplasm of gastrointestinal tract   . GERD (gastroesophageal reflux disease)   . H/O: hysterectomy   . Hyperlipidemia   . Hypertension   . Hypothyroidism   . Orthostatic hypotension   . Osteoarthrosis, unspecified whether generalized or localized, unspecified site   . Osteoporosis   . Unspecified functional disorder of intestine    Past Surgical History:  Procedure Laterality Date  . ABDOMINAL HYSTERECTOMY    . APPENDECTOMY    . CARDIAC CATHETERIZATION    . CATARACT EXTRACTION W/ INTRAOCULAR LENS IMPLANT Bilateral   . CERVICAL SPINE SURGERY    . cyst removal on back    . GANGLION CYST  EXCISION Bilateral   . KNEE SURGERY Left   . NASAL SINUS SURGERY    . TONSILLECTOMY    . TOTAL KNEE ARTHROPLASTY Right    Social History   Social History  . Marital status: Married    Spouse name: N/A  . Number of children: 0  . Years of education: N/A   Occupational History  . retired Retired   Social History Main Topics  . Smoking status: Never Smoker  . Smokeless tobacco: Never Used     Comment: was a passive smoker for 15 yrs  . Alcohol use No  . Drug use: No  . Sexual activity: Not Currently   Other Topics Concern  . None   Social History Narrative  . None   Allergies  Allergen Reactions  . Isosorbide Dinitrate Other (See Comments)    Other reaction(s): severe headaches  . Prednisone Other (See Comments)    Heart palpitations  . Amoxicillin Swelling    Lip/throat swelling  . Sudafed [Pseudoephedrine] Other (See Comments)    Other reaction(s): rash  . Aspirin Other (See Comments)    Baby aspirin is tolerated, itching in the past  . Amoxicillin-Pot Clavulanate Swelling and Other (See Comments)    Other reaction(s): Facial and tounge swelling  . Cephalexin Other (See Comments)  . Percocet [Oxycodone-Acetaminophen] Other (See Comments)  . Sulfa Antibiotics Other (See Comments)  . Sulfonamide Derivatives Other (See Comments)    Unknown reaction  a long time ago   Family History  Problem Relation Age of Onset  . Colon cancer Mother   . Cancer Mother   . Varicose Veins Mother   . Heart attack Father 51  . Heart disease Father   . Brain cancer Other     1st degree relative  . Heart disease Maternal Uncle     x 4  . Heart disease Maternal Aunt     x 1  . Pancreatic cancer Neg Hx   . Prostate cancer Neg Hx   . Stomach cancer Neg Hx     pt notes that mother says she had "part of her stomach removed"  . Liver disease Neg Hx   . Kidney disease Neg Hx     Past medical history, social, surgical and family history all reviewed in electronic medical record.   No pertanent information unless stated regarding to the chief complaint.   Review of Systems: No headache, visual changes, nausea, vomiting, diarrhea, constipation, dizziness, abdominal pain, skin rash, fevers, chills, night sweats, weight loss, swollen lymph nodes, body aches, joint swelling, muscle aches, chest pain, shortness of breath, mood changes.    Objective  Blood pressure 122/78, pulse 90, height 5\' 5"  (1.651 m), weight 145 lb 12.8 oz (66.1 kg), SpO2 98 %.   Systems examined below as of 03/12/17 General: NAD A&O x3 mood, affect normal  HEENT: Pupils equal, extraocular movements intact no nystagmus Respiratory: not short of breath at rest or with speaking Cardiovascular: 2+ lower extremity edema which is new but symmetric negative Thompson, non tender Skin: Warm dry intact with no signs of infection or rash on extremities or on axial skeleton. Abdomen: Soft nontender, no masses Neuro: Cranial nerves  intact, neurovascularly intact in all extremities with 2+ DTRs and 2+ pulses. Lymph: No lymphadenopathy appreciated today .  Gait antalgic gait still noted MSK: Non tender with full range of motion and good stability and symmetric strength and tone of shoulders, elbows, wrist,  and ankles bilaterally arthritic changes .     Back Exam:  Inspection: Unremarkable  Motion: Flexion 35 deg, Extension 15 deg, Side Bending to 35 deg bilaterally,  Rotation to 35 deg bilaterally  SLR laying: positive. Right  XSLR laying: Negative  Palpable tenderness: Her seen tenderness in the gluteal area and over the piriformis. FABER: Positive right side. Sensory change: Gross sensation intact to all lumbar and sacral dermatomes.  Reflexes: 2+ at both patellar tendons, 2+ at achilles tendons, Babinski's downgoing.  Strength at foot  4 out of 5 on the right side compared to 5 out of 5 on the left side of the lower extremity.  After verbal consent patient was prepped with alcohol swabs and then with a  21-gauge 3 inch needle patient was injected with a total of 0.5 mL of 0.5% Marcaine and 1 mL of Kenalog 40 mg/dL into the right piriformis muscle. Patient tolerated the procedure well. Last injection instructions given.   Impression and Recommendations:     This case required medical decision making of moderate complexity.      Note: This dictation was prepared with Dragon dictation along with smaller phrase technology. Any transcriptional errors that result from this process are unintentional.

## 2017-03-12 ENCOUNTER — Encounter: Payer: Self-pay | Admitting: Family Medicine

## 2017-03-12 ENCOUNTER — Ambulatory Visit (INDEPENDENT_AMBULATORY_CARE_PROVIDER_SITE_OTHER): Payer: Medicare Other | Admitting: Family Medicine

## 2017-03-12 DIAGNOSIS — G5701 Lesion of sciatic nerve, right lower limb: Secondary | ICD-10-CM | POA: Insufficient documentation

## 2017-03-12 DIAGNOSIS — R600 Localized edema: Secondary | ICD-10-CM

## 2017-03-12 DIAGNOSIS — I2510 Atherosclerotic heart disease of native coronary artery without angina pectoris: Secondary | ICD-10-CM

## 2017-03-12 MED ORDER — FUROSEMIDE 20 MG PO TABS: 20.0000 mg | ORAL_TABLET | Freq: Every day | ORAL | 3 refills | Status: DC

## 2017-03-12 NOTE — Assessment & Plan Note (Signed)
Patient does have more of a piriformis syndrome. We discussed with patient at great length. We discussed icing regimen and home exercises. We discussed which activities doing which was potentially avoid. I do feel that a lot of this is still lumbar radiculopathy. Patient is taking gabapentin 200 mg at night. We need to increase been concern for potential side effects. We'll continue to monitor. Following up closely in the next 2 weeks.

## 2017-03-12 NOTE — Assessment & Plan Note (Signed)
Patient is in bilateral lower extremity edema. Patient has had varicose vein previously but seems to be worsening with the lower extremity edema. Patient is a hernia on blood thinners the likelihood of any type of deep venous thrombosis. Patient could have potential congestive heart failure but denies any significant shortness of breath today. Patient will put on a low dose of Lasix. We will continue to monitor. Patient will be coming back in the next 10-14 days for further evaluation.

## 2017-03-12 NOTE — Patient Instructions (Signed)
Good to see you  We did an injection in your piriformis muscle.  Lasix daily for at least 3 days but call me on Friday to tell me how you are doing.  We can continue to watch this and maybe come back in 1-2 weeks. May need you to see your primary care doc if you have worsening swelling or shortness of breath.

## 2017-03-14 ENCOUNTER — Emergency Department (HOSPITAL_COMMUNITY): Payer: Medicare Other

## 2017-03-14 ENCOUNTER — Encounter (HOSPITAL_COMMUNITY): Payer: Self-pay

## 2017-03-14 ENCOUNTER — Emergency Department (HOSPITAL_COMMUNITY)
Admission: EM | Admit: 2017-03-14 | Discharge: 2017-03-14 | Disposition: A | Discharge status: Home / Self Care | Payer: Medicare Other | Attending: Emergency Medicine | Admitting: Emergency Medicine

## 2017-03-14 DIAGNOSIS — E039 Hypothyroidism, unspecified: Secondary | ICD-10-CM | POA: Insufficient documentation

## 2017-03-14 DIAGNOSIS — Z79899 Other long term (current) drug therapy: Secondary | ICD-10-CM | POA: Diagnosis not present

## 2017-03-14 DIAGNOSIS — Z955 Presence of coronary angioplasty implant and graft: Secondary | ICD-10-CM | POA: Insufficient documentation

## 2017-03-14 DIAGNOSIS — I2510 Atherosclerotic heart disease of native coronary artery without angina pectoris: Secondary | ICD-10-CM | POA: Insufficient documentation

## 2017-03-14 DIAGNOSIS — I110 Hypertensive heart disease with heart failure: Secondary | ICD-10-CM | POA: Diagnosis not present

## 2017-03-14 DIAGNOSIS — I5023 Acute on chronic systolic (congestive) heart failure: Secondary | ICD-10-CM | POA: Diagnosis not present

## 2017-03-14 DIAGNOSIS — R0602 Shortness of breath: Secondary | ICD-10-CM | POA: Diagnosis not present

## 2017-03-14 DIAGNOSIS — Z96651 Presence of right artificial knee joint: Secondary | ICD-10-CM | POA: Insufficient documentation

## 2017-03-14 LAB — CBC WITH DIFFERENTIAL/PLATELET
Basophils Absolute: 0 10*3/uL (ref 0.0–0.1)
Basophils Relative: 0 %
EOS ABS: 0.1 10*3/uL (ref 0.0–0.7)
EOS PCT: 1 %
HCT: 32.6 % — ABNORMAL LOW (ref 36.0–46.0)
Hemoglobin: 10.5 g/dL — ABNORMAL LOW (ref 12.0–15.0)
LYMPHS ABS: 1.5 10*3/uL (ref 0.7–4.0)
LYMPHS PCT: 16 %
MCH: 27.4 pg (ref 26.0–34.0)
MCHC: 32.2 g/dL (ref 30.0–36.0)
MCV: 85.1 fL (ref 78.0–100.0)
MONO ABS: 0.8 10*3/uL (ref 0.1–1.0)
Monocytes Relative: 9 %
Neutro Abs: 7.3 10*3/uL (ref 1.7–7.7)
Neutrophils Relative %: 74 %
PLATELETS: 281 10*3/uL (ref 150–400)
RBC: 3.83 MIL/uL — AB (ref 3.87–5.11)
RDW: 15.9 % — AB (ref 11.5–15.5)
WBC: 9.7 10*3/uL (ref 4.0–10.5)

## 2017-03-14 LAB — COMPREHENSIVE METABOLIC PANEL
ALT: 38 U/L (ref 14–54)
AST: 37 U/L (ref 15–41)
Albumin: 4 g/dL (ref 3.5–5.0)
Alkaline Phosphatase: 92 U/L (ref 38–126)
Anion gap: 9 (ref 5–15)
BILIRUBIN TOTAL: 1.1 mg/dL (ref 0.3–1.2)
BUN: 34 mg/dL — AB (ref 6–20)
CO2: 26 mmol/L (ref 22–32)
Calcium: 9 mg/dL (ref 8.9–10.3)
Chloride: 103 mmol/L (ref 101–111)
Creatinine, Ser: 1.16 mg/dL — ABNORMAL HIGH (ref 0.44–1.00)
GFR calc Af Amer: 49 mL/min — ABNORMAL LOW (ref >60)
GFR, EST NON AFRICAN AMERICAN: 43 mL/min — AB (ref >60)
Glucose, Bld: 107 mg/dL — ABNORMAL HIGH (ref 65–99)
POTASSIUM: 3.5 mmol/L (ref 3.5–5.1)
Sodium: 138 mmol/L (ref 135–145)
TOTAL PROTEIN: 6.9 g/dL (ref 6.5–8.1)

## 2017-03-14 LAB — I-STAT TROPONIN, ED: Troponin i, poc: 0 ng/mL (ref 0.00–0.08)

## 2017-03-14 LAB — BRAIN NATRIURETIC PEPTIDE: B NATRIURETIC PEPTIDE 5: 652.8 pg/mL — AB (ref 0.0–100.0)

## 2017-03-14 MED ORDER — FUROSEMIDE 40 MG PO TABS
40.0000 mg | ORAL_TABLET | Freq: Once | ORAL | Status: AC
  Administered 2017-03-14: 40 mg via ORAL
  Filled 2017-03-14: qty 1

## 2017-03-14 NOTE — Discharge Instructions (Signed)
Continue your lasix.  Return for worsening shortness of breath, passing out, chest pain.

## 2017-03-14 NOTE — ED Triage Notes (Signed)
Pt here with shortness of breath and leg swelling.  Started on Wednesday.  Had an MD give her lasix.  SOB with walking.  No chest pain.

## 2017-03-14 NOTE — ED Provider Notes (Signed)
[facility] DEPT Provider Note   CSN: [id number] Arrival date & time: 03/14/17  1457     History   Chief Complaint Chief Complaint  Patient presents with  . Shortness of Breath  . Leg Swelling    HPI [name] [name] is a 83 y.o. female.  83 yo F with a chief complaint of shortness of breath on exertion and weakness in lower extremity edema. This been going on for the past week or so. She saw a primary care provider yesterday he started on Lasix. Patient has been taking this haphazardly and has not had improvement with it. She has the swelling is getting mildly worse. She is on Eliquis for atrial fibrillation. Has a history of a DVT in the past.   The history is provided by the patient.  Shortness of Breath  This is a new problem. The average episode lasts 1 week. The problem occurs continuously.The current episode started less than 1 hour ago. The problem has not changed since onset.Pertinent negatives include no fever, no headaches, no rhinorrhea, no wheezing, no chest pain and no vomiting. She has tried nothing for the symptoms. The treatment provided no relief. She has had no prior hospitalizations. She has had no prior ED visits. Associated medical issues do not include CAD.    Past Medical History:  Diagnosis Date  . Allergic rhinitis, cause unspecified   . Anxiety and depression   . Chronic headaches   . Coronary artery disease   . DJD (degenerative joint disease)   . Family history of malignant neoplasm of gastrointestinal tract   . GERD (gastroesophageal reflux disease)   . H/O: hysterectomy   . Hyperlipidemia   . Hypertension   . Hypothyroidism   . Orthostatic hypotension   . Osteoarthrosis, unspecified whether generalized or localized, unspecified site   . Osteoporosis   . Unspecified functional disorder of intestine     Patient Active Problem List   Diagnosis Date Noted  . Piriformis syndrome, right 03/12/2017  . Lower extremity edema 03/12/2017  .  Contusion of right hip 03/25/2016  . Lumbar radiculopathy 02/21/2016  . Greater trochanteric bursitis of right hip 01/26/2016  . Degenerative arthritis of left knee 01/26/2016  . Neck pain 08/15/2015  . Abdominal pain, chronic, right lower quadrant 05/08/2015  . History of colonic polyps 05/08/2015  . Varicose veins of leg with complications 78/93/8101  . Chronic venous insufficiency 04/28/2015  . Abdominal pain, unspecified site 08/29/2014  . Family history of colon cancer 08/29/2014  . Rectal bleeding 08/29/2014  . Unspecified constipation 08/29/2014  . Chest pain, non-cardiac 08/29/2014  . Postthrombotic syndrome 06/23/2012  . Hemorrhage of rectum and anus 06/23/2012  . Other and unspecified coagulation defects 06/23/2012  . DVT, lower extremity ([place]) 06/21/2012  . Hyponatremia 06/21/2012  . Internal hemorrhoids 07/30/2011  . Family history of malignant neoplasm of gastrointestinal tract 07/30/2011  . Chest pain, unspecified 05/02/2011  . CAD, NATIVE VESSEL 03/15/2010  . OTHER CONSTIPATION 01/22/2010  . Hyperlipemia 04/08/2009  . HYPOTENSION, ORTHOSTATIC 04/08/2009  . Essential hypertension 08/12/2007  . ALLERGIC RHINITIS 08/12/2007  . OSTEOARTHRITIS 08/12/2007    Past Surgical History:  Procedure Laterality Date  . ABDOMINAL HYSTERECTOMY    . APPENDECTOMY    . CARDIAC CATHETERIZATION    . CATARACT EXTRACTION W/ INTRAOCULAR LENS IMPLANT Bilateral   . CERVICAL SPINE SURGERY    . cyst removal on back    . GANGLION CYST EXCISION Bilateral   . KNEE SURGERY Left   .  NASAL SINUS SURGERY    . TONSILLECTOMY    . TOTAL KNEE ARTHROPLASTY Right     OB History    No data available       Home Medications    Prior to Admission medications   Medication Sig Start Date End Date Taking? Authorizing Provider  acetaminophen (TYLENOL) 500 MG tablet Take 500 mg by mouth every 6 (six) hours as needed for moderate pain.    Yes Historical Provider, MD  atorvastatin (LIPITOR) 80 MG  tablet Take 1 tablet (80 mg total) by mouth daily. 01/22/17  Yes Fay Records, MD  ELIQUIS 5 MG TABS tablet TAKE 1 TABLET BY MOUTH TWICE A DAY 02/10/17  Yes Fay Records, MD  esomeprazole (NEXIUM) 40 MG capsule Take 40 mg by mouth daily at 12 noon.   Yes Historical Provider, MD  furosemide (LASIX) 20 MG tablet Take 1 tablet (20 mg total) by mouth daily. 03/12/17  Yes Lyndal Pulley, DO  gabapentin (NEURONTIN) 100 MG capsule TAKE 2 CAPSULES (200 MG TOTAL) BY MOUTH AT BEDTIME. 02/03/17  Yes Lyndal Pulley, DO  levothyroxine (SYNTHROID, LEVOTHROID) 112 MCG tablet Take 112 mcg by mouth daily before breakfast.   Yes Historical Provider, MD  valsartan (DIOVAN) 80 MG tablet Take 80 mg by mouth 2 (two) times daily.   Yes Historical Provider, MD  Vitamin D, Ergocalciferol, (DRISDOL) 50000 units CAPS capsule TAKE 1 CAPSULE EVERY 7 DAYS 03/03/17  Yes Lyndal Pulley, DO    Family History Family History  Problem Relation Age of Onset  . Colon cancer Mother   . Cancer Mother   . Varicose Veins Mother   . Heart attack Father 75  . Heart disease Father   . Brain cancer Other     1st degree relative  . Heart disease Maternal Uncle     x 4  . Heart disease Maternal Aunt     x 1  . Pancreatic cancer Neg Hx   . Prostate cancer Neg Hx   . Stomach cancer Neg Hx     pt notes that mother says she had "part of her stomach removed"  . Liver disease Neg Hx   . Kidney disease Neg Hx     Social History Social History  Substance Use Topics  . Smoking status: Never Smoker  . Smokeless tobacco: Never Used     Comment: was a passive smoker for 15 yrs  . Alcohol use No     Allergies   Isosorbide dinitrate; Prednisone; Amoxicillin; Sudafed [pseudoephedrine]; Aspirin; Amoxicillin-pot clavulanate; Cephalexin; Percocet [oxycodone-acetaminophen]; Sulfa antibiotics; and Sulfonamide derivatives   Review of Systems Review of Systems  Constitutional: Negative for chills and fever.  HENT: Negative for congestion  and rhinorrhea.   Eyes: Negative for redness and visual disturbance.  Respiratory: Positive for shortness of breath. Negative for wheezing.   Cardiovascular: Negative for chest pain and palpitations.  Gastrointestinal: Negative for nausea and vomiting.  Genitourinary: Negative for dysuria and urgency.  Musculoskeletal: Negative for arthralgias and myalgias.  Skin: Negative for pallor and wound.  Neurological: Negative for dizziness, weakness (generalized) and headaches.     Physical Exam Updated Vital Signs BP (!) 167/94 (BP Location: Right Arm)   Pulse 74   Temp 97.9 F (36.6 C) (Oral)   Resp 12   Ht 5\' 5"  (1.651 m)   Wt 125 lb (56.7 kg)   SpO2 99%   BMI 20.80 kg/m   Physical Exam  Constitutional: She is oriented to  person, place, and time. She appears well-developed and well-nourished. No distress.  HENT:  Head: Normocephalic and atraumatic.  Eyes: EOM are normal. Pupils are equal, round, and reactive to light.  Neck: Normal range of motion. Neck supple.  Cardiovascular: Normal rate and regular rhythm.  Exam reveals no gallop and no friction rub.   No murmur heard. Pulmonary/Chest: Effort normal. She has no wheezes. She has no rales.  Abdominal: Soft. She exhibits no distension and no mass. There is no tenderness. There is no guarding.  Musculoskeletal: She exhibits edema (pitting To the thighs bilaterally. Right greater than left.). She exhibits no tenderness.  Neurological: She is alert and oriented to person, place, and time.  Skin: Skin is warm and dry. She is not diaphoretic.  Psychiatric: She has a normal mood and affect. Her behavior is normal.  Nursing note and vitals reviewed.    ED Treatments / Results  Labs (all labs ordered are listed, but only abnormal results are displayed) Labs Reviewed  CBC WITH DIFFERENTIAL/PLATELET - Abnormal; Notable for the following:       Result Value   RBC 3.83 (*)    Hemoglobin 10.5 (*)    HCT 32.6 (*)    RDW 15.9 (*)     All other components within normal limits  COMPREHENSIVE METABOLIC PANEL - Abnormal; Notable for the following:    Glucose, Bld 107 (*)    BUN 34 (*)    Creatinine, Ser 1.16 (*)    GFR calc non Af Amer 43 (*)    GFR calc Af Amer 49 (*)    All other components within normal limits  BRAIN NATRIURETIC PEPTIDE - Abnormal; Notable for the following:    B Natriuretic Peptide 652.8 (*)    All other components within normal limits  TROPONIN I  I-STAT TROPOININ, ED    EKG  EKG Interpretation  Date/Time:  Friday March 14 2017 15:25:49 EDT Ventricular Rate:  70 PR Interval:    QRS Duration: 84 QT Interval:  435 QTC Calculation: 470 R Axis:   98 Text Interpretation:  Atrial fibrillation Ventricular premature complex Right axis deviation Borderline low voltage, extremity leads Anteroseptal infarct, old No significant change since last tracing Confirmed by Pasquale Matters MD, DANIEL [phone]) on 03/14/2017 7:44:16 PM       Radiology Dg Chest 2 View  Result Date: 03/14/2017 CLINICAL DATA:  Shortness of breath EXAM: CHEST  2 VIEW COMPARISON:  10/26/2013 FINDINGS: Mild cardiomegaly, chronic. There is no edema, consolidation, effusion, or pneumothorax. Exaggerated kyphosis. No acute osseous finding. IMPRESSION: 1. No evidence of active disease. 2. Chronic cardiomegaly. Electronically Signed   By: Monte Fantasia M.D.   On: 03/14/2017 15:40    Procedures Procedures (including critical care time)  Medications Ordered in ED Medications  furosemide (LASIX) tablet 40 mg (40 mg Oral Given 03/14/17 2258)     Initial Impression / Assessment and Plan / ED Course  I have reviewed the triage vital signs and the nursing notes.  Pertinent labs & imaging results that were available during my care of the patient were reviewed by me and considered in my medical decision making (see chart for details).     82 yo F With a chief complaints of shortness of breath on exertion and weakness. EKG with no specific  finding. Chest x-ray with cardiomegaly but no other finding. Clear lung sounds. Patient has right lower 70 swelling greater than left. She is on Eliquis and has been taking it regularly. Feel the DVT  is unlikely due to her are to be on anticoagulation.   Physical exam which more swollen than the other will order a ultrasound to be done tomorrow. Feel that PE is unlikely. Troponin is negative. Patient has had no shortness of breath over the course of 6 hours. I feel no need for repeat. She will follow with her cardiologist and family physician.  11:03 PM:  I have discussed the diagnosis/risks/treatment options with the patient and family and believe the pt to be eligible for discharge home to follow-up with PCP. We also discussed returning to the ED immediately if new or worsening sx occur. We discussed the sx which are most concerning (e.g., sudden worsening pain, fever, inability to tolerate by mouth) that necessitate immediate return. Medications administered to the patient during their visit and any new prescriptions provided to the patient are listed below.  Medications given during this visit Medications  furosemide (LASIX) tablet 40 mg (40 mg Oral Given 03/14/17 2258)     The patient appears reasonably screen and/or stabilized for discharge and I doubt any other medical condition or other [facility] requiring further screening, evaluation, or treatment in the ED at this time prior to discharge.    Final Clinical Impressions(s) / ED Diagnoses   Final diagnoses:  Acute on chronic systolic congestive heart failure [facility])    New Prescriptions Discharge Medication List as of 03/14/2017 10:43 PM       Deno Etienne, DO 03/14/17 2303

## 2017-03-17 ENCOUNTER — Ambulatory Visit (HOSPITAL_COMMUNITY)
Admission: RE | Admit: 2017-03-17 | Discharge: 2017-03-17 | Disposition: A | Discharge status: Home / Self Care | Payer: Medicare Other | Source: Ambulatory Visit | Attending: Emergency Medicine | Admitting: Emergency Medicine

## 2017-03-17 ENCOUNTER — Other Ambulatory Visit: Payer: Medicare Other | Admitting: *Deleted

## 2017-03-17 ENCOUNTER — Inpatient Hospital Stay (HOSPITAL_COMMUNITY): Admission: RE | Admit: 2017-03-17 | Payer: Medicare Other | Source: Ambulatory Visit

## 2017-03-17 DIAGNOSIS — I825Z1 Chronic embolism and thrombosis of unspecified deep veins of right distal lower extremity: Secondary | ICD-10-CM | POA: Insufficient documentation

## 2017-03-17 DIAGNOSIS — M7989 Other specified soft tissue disorders: Secondary | ICD-10-CM | POA: Diagnosis not present

## 2017-03-17 DIAGNOSIS — Z86718 Personal history of other venous thrombosis and embolism: Secondary | ICD-10-CM

## 2017-03-17 DIAGNOSIS — I10 Essential (primary) hypertension: Secondary | ICD-10-CM

## 2017-03-17 DIAGNOSIS — M79604 Pain in right leg: Secondary | ICD-10-CM | POA: Diagnosis present

## 2017-03-17 DIAGNOSIS — M79609 Pain in unspecified limb: Secondary | ICD-10-CM | POA: Diagnosis not present

## 2017-03-17 DIAGNOSIS — E785 Hyperlipidemia, unspecified: Secondary | ICD-10-CM

## 2017-03-17 LAB — CBC WITH DIFFERENTIAL/PLATELET
BASOS: 0 %
Basophils Absolute: 0 10*3/uL (ref 0.0–0.2)
EOS (ABSOLUTE): 0.1 10*3/uL (ref 0.0–0.4)
Eos: 1 %
Hematocrit: 36.7 % (ref 34.0–46.6)
Hemoglobin: 11.6 g/dL (ref 11.1–15.9)
IMMATURE GRANS (ABS): 0 10*3/uL (ref 0.0–0.1)
Immature Granulocytes: 0 %
LYMPHS ABS: 1.7 10*3/uL (ref 0.7–3.1)
Lymphs: 17 %
MCH: 26.7 pg (ref 26.6–33.0)
MCHC: 31.6 g/dL (ref 31.5–35.7)
MCV: 84 fL (ref 79–97)
MONOS ABS: 0.9 10*3/uL (ref 0.1–0.9)
Monocytes: 9 %
NEUTROS ABS: 7.5 10*3/uL — AB (ref 1.4–7.0)
Neutrophils: 73 %
PLATELETS: 308 10*3/uL (ref 150–379)
RBC: 4.35 x10E6/uL (ref 3.77–5.28)
RDW: 15.2 % (ref 12.3–15.4)
WBC: 10.1 10*3/uL (ref 3.4–10.8)

## 2017-03-17 LAB — LIPID PANEL
CHOLESTEROL TOTAL: 155 mg/dL (ref 100–199)
Chol/HDL Ratio: 2.3 ratio units (ref 0.0–4.4)
HDL: 67 mg/dL (ref >39)
LDL CALC: 73 mg/dL (ref 0–99)
TRIGLYCERIDES: 75 mg/dL (ref 0–149)
VLDL CHOLESTEROL CAL: 15 mg/dL (ref 5–40)

## 2017-03-17 NOTE — Progress Notes (Signed)
VASCULAR LAB PRELIMINARY  PRELIMINARY  PRELIMINARY  PRELIMINARY  Right lower extremity venous duplex completed.    Preliminary report:  There is no acute DVT noted in the right lower extremity.  There is chronic DVT noted in the right femoral vein.   Jasdeep Dejarnett, RVT 03/17/2017, 11:04 AM

## 2017-03-17 NOTE — Progress Notes (Signed)
0 0

## 2017-03-17 NOTE — Addendum Note (Signed)
Addended by: Eulis Foster on: 03/17/2017 10:43 AM   Modules accepted: Orders

## 2017-03-19 ENCOUNTER — Other Ambulatory Visit: Payer: Medicare Other

## 2017-03-26 ENCOUNTER — Ambulatory Visit (INDEPENDENT_AMBULATORY_CARE_PROVIDER_SITE_OTHER): Payer: Medicare Other | Admitting: Family Medicine

## 2017-03-26 ENCOUNTER — Encounter: Payer: Self-pay | Admitting: Family Medicine

## 2017-03-26 DIAGNOSIS — G5701 Lesion of sciatic nerve, right lower limb: Secondary | ICD-10-CM

## 2017-03-26 DIAGNOSIS — I872 Venous insufficiency (chronic) (peripheral): Secondary | ICD-10-CM | POA: Diagnosis not present

## 2017-03-26 DIAGNOSIS — I2510 Atherosclerotic heart disease of native coronary artery without angina pectoris: Secondary | ICD-10-CM

## 2017-03-26 DIAGNOSIS — M1712 Unilateral primary osteoarthritis, left knee: Secondary | ICD-10-CM | POA: Diagnosis not present

## 2017-03-26 NOTE — Assessment & Plan Note (Signed)
Patient does have chronic venous insufficiency but I do feel that patient likely had more of a congestive heart failure. Patient did have mild elevation and patient's BNP scheduled to see cardiologist in the next month. Patient knows if any chest pain or shortness of breath seek medical attention immediately.

## 2017-03-26 NOTE — Patient Instructions (Signed)
God to see you  LContinue the lasix at 20 mg until you see Dr. Harrington Challenger.  If you get lightheaded call me but you should do fine.  Watch for more swelling in your legs Injected your knee today  I think getting you help for your husband will do worlds of good.  See me when you need me otherwise.  Can repeat injection as needed.

## 2017-03-26 NOTE — Assessment & Plan Note (Signed)
Improved after injection. Encourage her to continue with conservative therapy and home exercises.

## 2017-03-26 NOTE — Progress Notes (Signed)
[name] [address] [place], [place] [address] Phone: [phone] Subjective:     CC: Right leg pain f/u  GGY:IRSWNIOEVO  [name] [name] is a 83 y.o. female coming in with complaint of right leg pain. Past medical history is significant for a lumbar radiculopathy as well as a greater trochanteric bursitis with last injection 01/01/2017. Patient was last seen and did have a piriformis injection. Patient states The back pain seems to be doing relatively well. Patient states that she seems to have the radiation down the leg anymore.  Patient was still having significant amount of swelling of the lower extremities. Was seen in emergency room for further workup. Patient did have the swelling and was to increase Lasix to 40 mg daily. Patient was to follow-up with primary care provider as well as cardiologist. Patient states she was doing the 40 mg daily of the Lasix for 5 days and that helped out significant limp. Now continuing the 20 mg on a regular basis and has not had any recurrence. Does like she can breathe deeper is well.   Left knee pain known arthritic changes. Started having worsening pain and instability. 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 Patient states that it seems to be little more swollen.  Previous imaging: Patient has had x-rays of her lower back previously and has had moderate to severe degenerative disc disease at L2-L3.  Past Medical History:  Diagnosis Date  . Allergic rhinitis, cause unspecified   . Anxiety and depression   . Chronic headaches   . Coronary artery disease   . DJD (degenerative joint disease)   . Family history of malignant neoplasm of gastrointestinal tract   . GERD (gastroesophageal reflux disease)   . H/O: hysterectomy   . Hyperlipidemia   . Hypertension   . Hypothyroidism   . Orthostatic hypotension   . Osteoarthrosis, unspecified whether generalized or  localized, unspecified site   . Osteoporosis   . Unspecified functional disorder of intestine    Past Surgical History:  Procedure Laterality Date  . ABDOMINAL HYSTERECTOMY    . APPENDECTOMY    . CARDIAC CATHETERIZATION    . CATARACT EXTRACTION W/ INTRAOCULAR LENS IMPLANT Bilateral   . CERVICAL SPINE SURGERY    . cyst removal on back    . GANGLION CYST EXCISION Bilateral   . KNEE SURGERY Left   . NASAL SINUS SURGERY    . TONSILLECTOMY    . TOTAL KNEE ARTHROPLASTY Right    Social History   Social History  . Marital status: Married    Spouse name: N/A  . Number of children: 0  . Years of education: N/A   Occupational History  . retired Retired   Social History Main Topics  . Smoking status: Never Smoker  . Smokeless tobacco: Never Used     Comment: was a passive smoker for 15 yrs  . Alcohol use No  . Drug use: No  . Sexual activity: Not Currently   Other Topics Concern  . None   Social History Narrative  . None   Allergies  Allergen Reactions  . Isosorbide Dinitrate Other (See Comments)    Other reaction(s): severe headaches  . Prednisone Other (See Comments)    Heart palpitations  . Amoxicillin Swelling    Lip/throat swelling  . Sudafed [Pseudoephedrine] Other (See Comments)    Other reaction(s): rash  . Aspirin Other (See Comments)  Baby aspirin is tolerated, itching in the past  . Amoxicillin-Pot Clavulanate Swelling and Other (See Comments)    Other reaction(s): Facial and tounge swelling  . Cephalexin Other (See Comments)  . Percocet [Oxycodone-Acetaminophen] Other (See Comments)  . Sulfa Antibiotics Other (See Comments)  . Sulfonamide Derivatives Other (See Comments)    Unknown reaction a long time ago   Family History  Problem Relation Age of Onset  . Colon cancer Mother   . Cancer Mother   . Varicose Veins Mother   . Heart attack Father 57  . Heart disease Father   . Brain cancer Other     1st degree relative  . Heart disease Maternal  Uncle     x 4  . Heart disease Maternal Aunt     x 1  . Pancreatic cancer Neg Hx   . Prostate cancer Neg Hx   . Stomach cancer Neg Hx     pt notes that mother says she had "part of her stomach removed"  . Liver disease Neg Hx   . Kidney disease Neg Hx     Past medical history, social, surgical and family history all reviewed in electronic medical record.  No pertanent information unless stated regarding to the chief complaint.   Review of Systems: No headache, visual changes, nausea, vomiting, diarrhea, constipation, dizziness, abdominal pain, skin rash, fevers, chills, night sweats, weight loss, swollen lymph nodes, body aches, joint swelling, muscle aches, chest pain, shortness of breath, mood changes.    Objective  Blood pressure 110/72, pulse 79, height 5\' 5"  (1.651 m), weight 135 lb 3.2 oz (61.3 kg), SpO2 99 %.   Systems examined below as of 03/26/17 General: NAD A&O x3 mood, affect normal  HEENT: Pupils equal, extraocular movements intact no nystagmus Respiratory: not short of breath at rest or with speaking Cardiovascular: 2+ lower extremity edema which is new but symmetric negative Thompson, non tender Skin: Warm dry intact with no signs of infection or rash on extremities or on axial skeleton. Abdomen: Soft nontender, no masses Neuro: Cranial nerves  intact, neurovascularly intact in all extremities with 2+ DTRs and 2+ pulses. Lymph: No lymphadenopathy appreciated today .  Gait antalgic gait still noted MSK: Non tender with full range of motion and good stability and symmetric strength and tone of shoulders, elbows, wrist,  and ankles bilaterally arthritic changes .    Knee:left valgus deformity noted. Large thigh to calf ratio.  Tender to palpation over medial and PF joint line.  ROM full in flexion and extension and lower leg rotation. instability with valgus force.  painful patellar compression. Patellar glide with moderate crepitus. Patellar and quadriceps tendons  unremarkable. Hamstring and quadriceps strength is normal. Contralateral knee shows contralateral knee arthritic changes   Back Exam:  Inspection: Unremarkable  Motion: Flexion 35 deg, Extension 15 deg, Side Bending to 35 deg bilaterally,  Rotation to 35 deg bilaterally  SLR laying: positive. Right  XSLR laying: Negative  Palpable tenderness: tenderness in the gluteal area and over the piriformis. FABER: Positive right side. Sensory change: Gross sensation intact to all lumbar and sacral dermatomes.  Reflexes: 2+ at both patellar tendons, 2+ at achilles tendons, Babinski's downgoing.  Strength at foot  4 out of 5 on the right side compared to 5 out of 5 on the left side of the lower extremity.  After informed written and verbal consent, patient was seated on exam table. Left knee was prepped with alcohol swab and utilizing anterolateral approach, patient's left  knee space was injected with 4:1  marcaine 0.5%: Kenalog 40mg /dL. Patient tolerated the procedure well without immediate complications.    Impression and Recommendations:     This case required medical decision making of moderate complexity.      Note: This dictation was prepared with Dragon dictation along with smaller phrase technology. Any transcriptional errors that result from this process are unintentional.

## 2017-03-26 NOTE — Assessment & Plan Note (Signed)
Doing well after injection but was having worsening symptoms. Patient was given another injection again today. Patient is a fairly high risk individual. Likely does not need to make any strata changes at this time. Patient will follow-up again with me every 10 weeks for injections if needed. Should be a candidate again for the viscous supplementation if patient would like to try.

## 2017-04-04 ENCOUNTER — Encounter: Payer: Self-pay | Admitting: Internal Medicine

## 2017-04-06 NOTE — Progress Notes (Signed)
Cardiology Office Note   Date:  04/07/2017   ID:  [name] [name], DOB 1933/01/24, MRN [id number]  PCP:  Cammy Copa, MD  Cardiologist:   Dorris Carnes, MD   F?U of HTN, CAD, afib    History of Present Illness: [name] [name] is a 83 y.o. female with a history of HTN and CAD and atrial fibrillation  I saw her in November 2017  At that time she was having intermitt dizziness  Since seen still has intermitt dizzienss  1x per week  Has posterior HA few x per wk Pt also notes some SOB in March with walking to mail box  Continues  Different from last fall Some LE swelling   Denies CP    Outpatient Medications Prior to Visit  Medication Sig Dispense Refill  . acetaminophen (TYLENOL) 500 MG tablet Take 500 mg by mouth every 6 (six) hours as needed for moderate pain.     Marland Kitchen ELIQUIS 5 MG TABS tablet TAKE 1 TABLET BY MOUTH TWICE A DAY 180 tablet 1  . esomeprazole (NEXIUM) 40 MG capsule Take 40 mg by mouth daily at 12 noon.    . furosemide (LASIX) 20 MG tablet Take 1 tablet (20 mg total) by mouth daily. 30 tablet 3  . gabapentin (NEURONTIN) 100 MG capsule TAKE 2 CAPSULES (200 MG TOTAL) BY MOUTH AT BEDTIME. 180 capsule 1  . levothyroxine (SYNTHROID, LEVOTHROID) 112 MCG tablet Take 112 mcg by mouth daily before breakfast.    . valsartan (DIOVAN) 80 MG tablet Take 80 mg by mouth 2 (two) times daily.    . Vitamin D, Ergocalciferol, (DRISDOL) 50000 units CAPS capsule TAKE 1 CAPSULE EVERY 7 DAYS 12 capsule 0  . atorvastatin (LIPITOR) 80 MG tablet Take 1 tablet (80 mg total) by mouth daily. (Patient not taking: Reported on 04/07/2017) 90 tablet 3   No facility-administered medications prior to visit.      Allergies:   Isosorbide dinitrate; Prednisone; Amoxicillin; Sudafed [pseudoephedrine]; Aspirin; Amoxicillin-pot clavulanate; Cephalexin; Percocet [oxycodone-acetaminophen]; Sulfa antibiotics; and Sulfonamide derivatives   Past Medical History:  Diagnosis Date  . Allergic rhinitis, cause  unspecified   . Anxiety and depression   . Chronic headaches   . Coronary artery disease   . DJD (degenerative joint disease)   . Family history of malignant neoplasm of gastrointestinal tract   . GERD (gastroesophageal reflux disease)   . H/O: hysterectomy   . Hyperlipidemia   . Hypertension   . Hypothyroidism   . Orthostatic hypotension   . Osteoarthrosis, unspecified whether generalized or localized, unspecified site   . Osteoporosis   . Unspecified functional disorder of intestine     Past Surgical History:  Procedure Laterality Date  . ABDOMINAL HYSTERECTOMY    . APPENDECTOMY    . CARDIAC CATHETERIZATION    . CATARACT EXTRACTION W/ INTRAOCULAR LENS IMPLANT Bilateral   . CERVICAL SPINE SURGERY    . cyst removal on back    . GANGLION CYST EXCISION Bilateral   . KNEE SURGERY Left   . NASAL SINUS SURGERY    . TONSILLECTOMY    . TOTAL KNEE ARTHROPLASTY Right      Social History:  The patient  reports that she has never smoked. She has never used smokeless tobacco. She reports that she does not drink alcohol or use drugs.   Family History:  The patient's family history includes Brain cancer in her other; Cancer in her mother; Colon cancer in her mother; Heart attack (age of onset:  7) in her father; Heart disease in her father, maternal aunt, and maternal uncle; Varicose Veins in her mother.    ROS:  Please see the history of present illness. All other systems are reviewed and  Negative to the above problem except as noted.    PHYSICAL EXAM: VS:  BP 136/86   Pulse 80   Ht 5\' 5"  (1.651 m)   Wt 136 lb 1.9 oz (61.7 kg)   BMI 22.65 kg/m   GEN: Well nourished, well developed, in no acute distress HEENT: normal Neck: no JVD, carotid bruits, or masses Cardiac: RRR; no murmurs, rubs, or gallops,Tr edema  Respiratory:  clear to auscultation bilaterally, normal work of breathing GI: soft, nontender, nondistended, + BS  No hepatomegaly  MS: no deformity Moving all  extremities   Skin: warm and dry, no rash Neuro:  Strength and sensation are intact Psych: euthymic mood, full affect   EKG:  EKG is not  ordered today.   Lipid Panel    Component Value Date/Time   CHOL 155 03/17/2017 1043   TRIG 75 03/17/2017 1043   TRIG 128 12/26/2006 1227   HDL 67 03/17/2017 1043   CHOLHDL 2.3 03/17/2017 1043   CHOLHDL 3.3 11/18/2016 1016   VLDL 25 11/18/2016 1016   LDLCALC 73 03/17/2017 1043      Wt Readings from Last 3 Encounters:  04/07/17 136 lb 1.9 oz (61.7 kg)  03/26/17 135 lb 3.2 oz (61.3 kg)  03/14/17 125 lb (56.7 kg)      ASSESSMENT AND PLAN:  1  Dyspnea  Will set up for echo to reeval LVEF  Further testing based on these results  Check BMET and BNP    2  HTN  Continue meds     3  Atrial fib  Continue meds  Check p   4  HL   Continue lipitor    5  CAD  Mild plaquing     Current medicines are reviewed at length with the patient today.  The patient does not have concerns regarding medicines.  Signed, Dorris Carnes, MD  04/07/2017 2:23 PM    [address], [place], [place]  [address] Phone: [phone]; Fax: [phone]

## 2017-04-07 ENCOUNTER — Encounter: Payer: Self-pay | Admitting: Internal Medicine

## 2017-04-07 ENCOUNTER — Ambulatory Visit (INDEPENDENT_AMBULATORY_CARE_PROVIDER_SITE_OTHER): Payer: Medicare Other | Admitting: Internal Medicine

## 2017-04-07 VITALS — BP 136/86 | HR 80 | Ht 65.0 in | Wt 136.1 lb

## 2017-04-07 DIAGNOSIS — I071 Rheumatic tricuspid insufficiency: Secondary | ICD-10-CM

## 2017-04-07 DIAGNOSIS — I2510 Atherosclerotic heart disease of native coronary artery without angina pectoris: Secondary | ICD-10-CM | POA: Diagnosis not present

## 2017-04-07 DIAGNOSIS — R0602 Shortness of breath: Secondary | ICD-10-CM

## 2017-04-07 NOTE — Patient Instructions (Signed)
Your physician recommends that you continue on your current medications as directed. Please refer to the Current Medication list given to you today.  Your physician recommends that you return for lab work today (BMET, BNP)  Your physician has requested that you have an echocardiogram. Echocardiography is a painless test that uses sound waves to create images of your heart. It provides your doctor with information about the size and shape of your heart and how well your heart's chambers and valves are working. This procedure takes approximately one hour. There are no restrictions for this procedure.

## 2017-04-08 ENCOUNTER — Ambulatory Visit: Payer: Medicare Other | Admitting: Physician Assistant

## 2017-04-08 ENCOUNTER — Telehealth: Payer: Self-pay | Admitting: Nurse Practitioner

## 2017-04-08 DIAGNOSIS — R0602 Shortness of breath: Secondary | ICD-10-CM

## 2017-04-08 DIAGNOSIS — R7989 Other specified abnormal findings of blood chemistry: Secondary | ICD-10-CM

## 2017-04-08 LAB — BASIC METABOLIC PANEL
BUN/Creatinine Ratio: 26 (ref 12–28)
BUN: 31 mg/dL — ABNORMAL HIGH (ref 8–27)
CALCIUM: 9 mg/dL (ref 8.7–10.3)
CO2: 28 mmol/L (ref 18–29)
Chloride: 98 mmol/L (ref 96–106)
Creatinine, Ser: 1.21 mg/dL — ABNORMAL HIGH (ref 0.57–1.00)
GFR, EST AFRICAN AMERICAN: 48 mL/min/{1.73_m2} — AB (ref >59)
GFR, EST NON AFRICAN AMERICAN: 42 mL/min/{1.73_m2} — AB (ref >59)
Glucose: 90 mg/dL (ref 65–99)
Potassium: 3.9 mmol/L (ref 3.5–5.2)
SODIUM: 141 mmol/L (ref 134–144)

## 2017-04-08 LAB — PRO B NATRIURETIC PEPTIDE: NT-PRO BNP: 2688 pg/mL — AB (ref 0–738)

## 2017-04-08 MED ORDER — FUROSEMIDE 20 MG PO TABS: 40.0000 mg | ORAL_TABLET | Freq: Every day | ORAL | 11 refills | Status: DC

## 2017-04-08 MED ORDER — POTASSIUM CHLORIDE ER 10 MEQ PO TBCR: 10.0000 meq | EXTENDED_RELEASE_TABLET | Freq: Every day | ORAL | 11 refills | Status: DC

## 2017-04-08 NOTE — Telephone Encounter (Signed)
Reviewed Dr. Harrington Challenger' advice with patient who verbalized understanding. She is aware to come in Monday 4/16 for repeat lab work. She thanked me for the call.

## 2017-04-08 NOTE — Telephone Encounter (Signed)
Notes recorded by Fay Records, MD on 04/08/2017 at 2:13 PM EDT Fluid is up some  Would increase lasix to 40 daily with 10 KCL Check BMEt and BNP next Monday  Left message for patient to call office for instructions from Dr. Harrington Challenger regarding lab work that was done yesterday.

## 2017-04-14 ENCOUNTER — Other Ambulatory Visit: Payer: Medicare Other

## 2017-04-15 ENCOUNTER — Other Ambulatory Visit: Payer: Self-pay | Admitting: *Deleted

## 2017-04-15 ENCOUNTER — Other Ambulatory Visit: Payer: Medicare Other

## 2017-04-15 DIAGNOSIS — R7989 Other specified abnormal findings of blood chemistry: Secondary | ICD-10-CM | POA: Diagnosis not present

## 2017-04-15 DIAGNOSIS — R0602 Shortness of breath: Secondary | ICD-10-CM

## 2017-04-16 LAB — BASIC METABOLIC PANEL
BUN / CREAT RATIO: 19 (ref 12–28)
BUN: 22 mg/dL (ref 8–27)
CHLORIDE: 98 mmol/L (ref 96–106)
CO2: 26 mmol/L (ref 18–29)
Calcium: 9.2 mg/dL (ref 8.7–10.3)
Creatinine, Ser: 1.17 mg/dL — ABNORMAL HIGH (ref 0.57–1.00)
GFR calc Af Amer: 50 mL/min/{1.73_m2} — ABNORMAL LOW (ref >59)
GFR calc non Af Amer: 43 mL/min/{1.73_m2} — ABNORMAL LOW (ref >59)
GLUCOSE: 89 mg/dL (ref 65–99)
Potassium: 4.5 mmol/L (ref 3.5–5.2)
SODIUM: 140 mmol/L (ref 134–144)

## 2017-04-16 LAB — PRO B NATRIURETIC PEPTIDE: NT-Pro BNP: 2762 pg/mL — ABNORMAL HIGH (ref 0–738)

## 2017-04-17 ENCOUNTER — Telehealth: Payer: Self-pay | Admitting: *Deleted

## 2017-04-17 DIAGNOSIS — R7989 Other specified abnormal findings of blood chemistry: Secondary | ICD-10-CM

## 2017-04-17 DIAGNOSIS — I10 Essential (primary) hypertension: Secondary | ICD-10-CM

## 2017-04-17 MED ORDER — FUROSEMIDE 20 MG PO TABS: 60.0000 mg | ORAL_TABLET | Freq: Every day | ORAL | 11 refills | Status: DC

## 2017-04-17 NOTE — Telephone Encounter (Signed)
-----   Message from Dorris Carnes V, MD sent at 04/16/2017 10:28 PM EDT ----- Kidney function is stable  Potassium OK Fluid still appears to be up   Echois pending  I would recomm increasing lasix to 60 mg per day Repeat BMET and BNP in 2 wks   Echo is still pending

## 2017-04-17 NOTE — Telephone Encounter (Signed)
Spoke with pt and went over lab results and recommendations per Dr. Harrington Challenger.  Pt verbalized understanding and was in agreement with this plan.  Pt asked that Rx be sent to [facility].  Pt will have labs drawn on 05/01/17.

## 2017-04-22 ENCOUNTER — Ambulatory Visit (HOSPITAL_COMMUNITY): Payer: Medicare Other | Attending: Cardiovascular Disease

## 2017-04-22 ENCOUNTER — Other Ambulatory Visit: Payer: Self-pay

## 2017-04-22 DIAGNOSIS — I361 Nonrheumatic tricuspid (valve) insufficiency: Secondary | ICD-10-CM | POA: Insufficient documentation

## 2017-04-22 DIAGNOSIS — R0602 Shortness of breath: Secondary | ICD-10-CM | POA: Diagnosis not present

## 2017-04-22 DIAGNOSIS — I340 Nonrheumatic mitral (valve) insufficiency: Secondary | ICD-10-CM | POA: Insufficient documentation

## 2017-04-22 DIAGNOSIS — I071 Rheumatic tricuspid insufficiency: Secondary | ICD-10-CM

## 2017-04-22 DIAGNOSIS — I313 Pericardial effusion (noninflammatory): Secondary | ICD-10-CM | POA: Insufficient documentation

## 2017-04-22 DIAGNOSIS — I517 Cardiomegaly: Secondary | ICD-10-CM | POA: Insufficient documentation

## 2017-04-22 DIAGNOSIS — I420 Dilated cardiomyopathy: Secondary | ICD-10-CM | POA: Diagnosis not present

## 2017-04-25 ENCOUNTER — Telehealth: Payer: Self-pay | Admitting: Internal Medicine

## 2017-04-25 NOTE — Telephone Encounter (Signed)
Returned call to patient-aware echo needs to be reviewed and will call back with results next week.  Pt aware and verbalized understanding.

## 2017-04-25 NOTE — Telephone Encounter (Signed)
Patient is calling for echo results, thanks.

## 2017-04-28 ENCOUNTER — Ambulatory Visit: Payer: Medicare Other | Admitting: Internal Medicine

## 2017-04-30 ENCOUNTER — Ambulatory Visit: Payer: Medicare Other | Admitting: Family Medicine

## 2017-05-01 ENCOUNTER — Other Ambulatory Visit: Payer: Medicare Other

## 2017-05-05 ENCOUNTER — Ambulatory Visit: Payer: Medicare Other | Admitting: Family Medicine

## 2017-05-07 ENCOUNTER — Ambulatory Visit (INDEPENDENT_AMBULATORY_CARE_PROVIDER_SITE_OTHER): Payer: Medicare Other | Admitting: Family Medicine

## 2017-05-07 ENCOUNTER — Encounter: Payer: Self-pay | Admitting: Family Medicine

## 2017-05-07 VITALS — BP 124/78 | HR 101 | Temp 97.6°F | Ht 65.0 in | Wt 133.8 lb

## 2017-05-07 DIAGNOSIS — I10 Essential (primary) hypertension: Secondary | ICD-10-CM

## 2017-05-07 DIAGNOSIS — F329 Major depressive disorder, single episode, unspecified: Secondary | ICD-10-CM

## 2017-05-07 DIAGNOSIS — K219 Gastro-esophageal reflux disease without esophagitis: Secondary | ICD-10-CM | POA: Diagnosis not present

## 2017-05-07 DIAGNOSIS — R5383 Other fatigue: Secondary | ICD-10-CM | POA: Diagnosis not present

## 2017-05-07 DIAGNOSIS — E039 Hypothyroidism, unspecified: Secondary | ICD-10-CM | POA: Diagnosis not present

## 2017-05-07 DIAGNOSIS — I2510 Atherosclerotic heart disease of native coronary artery without angina pectoris: Secondary | ICD-10-CM | POA: Diagnosis not present

## 2017-05-07 DIAGNOSIS — F419 Anxiety disorder, unspecified: Secondary | ICD-10-CM

## 2017-05-07 DIAGNOSIS — F32A Depression, unspecified: Secondary | ICD-10-CM

## 2017-05-07 MED ORDER — FAMOTIDINE 20 MG PO TABS: 20.0000 mg | ORAL_TABLET | Freq: Every day | ORAL | 3 refills | Status: DC

## 2017-05-07 NOTE — Progress Notes (Signed)
[name] [name] is a 83 y.o. female is here to [employer].   Patient Care Team: Briscoe Deutscher, DO as PCP - General (Family Medicine)   History of Present Illness:   [name] [name], CMA, acting as scribe for Dr. Juleen China.  CC: Patient presents today to establish care.  States she has right hip and left knee pain that is monitored by Dr. Tamala Julian.  Reports feeling down at times and very fatigued.  States her husband has dementia and she is his caregiver.  This is very difficult for her.  No other complaints today.  HPI:  1. Gastroesophageal reflux disease. History of reflux. The patient has been on Nexium for years. She would like to try getting off of the medication. Denies any reflux symptoms.    3. Caregiver with fatigue. Husband with dementia. Sounds like his dementia has been be obtained due to end-stage. She is his caregiver. She has been struggling with expectations. She feels alone. She is tired. Her husband has 3 adult children that are not involved. She does generally feel safe in her home. Her husband does have an appointment with neurology early next week.    4. Acquired hypothyroidism. Ongoing. No medication changes in quite some time. Previously followed at [facility].    5. Essential hypertension. With history of atrial fibrillation and valvular heart disease. She is followed by cardiology. Those notes were reviewed by me. She is stable and taking medications without complications.    Health Maintenance Due  Topic Date Due  . PNA vac Low Risk Adult (2 of 2 - PCV13) 12/30/2006  . TETANUS/TDAP  12/30/2010   PMHx, SurgHx, SocialHx, Medications, and Allergies were reviewed in the Visit Navigator and updated as appropriate.   Past Medical History:  Diagnosis Date  . Allergic rhinitis, cause unspecified   . Anxiety and depression   . Atrial fibrillation ([place]) 05/08/2017  . Atrial septal aneurysm 05/08/2017  . Avitaminosis D 05/08/2017  . Carotid artery plaque, bilateral 05/08/2017    . Chronic headaches   . Chronic venous insufficiency 04/28/2015  . Constipation 08/29/2014  . Coronary artery disease   . DJD (degenerative joint disease)   . Family history of malignant neoplasm of gastrointestinal tract   . GERD (gastroesophageal reflux disease)   . H/O: hysterectomy   . Hyperlipidemia   . Hypertension   . Hypothyroidism   . Internal hemorrhoids 07/30/2011   Grade 2 hemorrhoids 10/19/14 band ligation right posterior hemorrhoidal bundle 12/07/14 band ligation right anterior hemorrhoidal bundle  02/03/15  band ligation left lateral hemorrhoidal bundle   . Orthostatic hypotension   . Osteoarthrosis, unspecified whether generalized or localized, unspecified site   . Osteoporosis   . Postthrombotic syndrome 06/23/2012   Left Leg   . Rectal bleeding 08/29/2014  . TI (tricuspid incompetence) 05/08/2017  . Unspecified functional disorder of intestine   . Varicose veins of leg with complications 8/76/8115   Past Surgical History:  Procedure Laterality Date  . ABDOMINAL HYSTERECTOMY    . APPENDECTOMY    . CARDIAC CATHETERIZATION    . CATARACT EXTRACTION W/ INTRAOCULAR LENS IMPLANT Bilateral   . CERVICAL SPINE SURGERY    . cyst removal on back    . GANGLION CYST EXCISION Bilateral   . KNEE SURGERY Left   . NASAL SINUS SURGERY    . TONSILLECTOMY    . TOTAL KNEE ARTHROPLASTY Right    Family History  Problem Relation Age of Onset  . Colon cancer Mother   .  Cancer Mother   . Varicose Veins Mother   . Heart attack Father 59  . Heart disease Father   . Brain cancer Other        1st degree relative  . Heart disease Maternal Uncle        x 4  . Heart disease Maternal Aunt        x 1  . Pancreatic cancer Neg Hx   . Prostate cancer Neg Hx   . Stomach cancer Neg Hx        pt notes that mother says she had "part of her stomach removed"  . Liver disease Neg Hx   . Kidney disease Neg Hx    Social History  Substance Use Topics  . Smoking status: Never Smoker  . Smokeless  tobacco: Never Used     Comment: was a passive smoker for 15 yrs  . Alcohol use No   Current Medications and Allergies:   .  acetaminophen (TYLENOL) 500 MG tablet, Take 500 mg by mouth every 6 (six) hours as needed for moderate pain. , Disp: , Rfl:  .  atenolol (TENORMIN) 50 MG tablet, Take 50 mg by mouth 2 (two) times daily., Disp: , Rfl:  .  atorvastatin (LIPITOR) 10 MG tablet, Take 10 mg by mouth daily., Disp: , Rfl:  .  ELIQUIS 5 MG TABS tablet, TAKE 1 TABLET BY MOUTH TWICE A DAY, Disp: 180 tablet, Rfl: 1 .  furosemide (LASIX) 20 MG tablet, Take 3 tablets (60 mg total) by mouth daily., Disp: 90 tablet, Rfl: 11 .  gabapentin (NEURONTIN) 100 MG capsule, TAKE 2 CAPSULES (200 MG TOTAL) BY MOUTH AT BEDTIME., Disp: 180 capsule, Rfl: 1 .  levothyroxine (SYNTHROID, LEVOTHROID) 112 MCG tablet, Take 112 mcg by mouth daily before breakfast., Disp: , Rfl:  .  potassium chloride (K-DUR) 10 MEQ tablet, Take 1 tablet (10 mEq total) by mouth daily., Disp: 30 tablet, Rfl: 11 .  valsartan (DIOVAN) 80 MG tablet, Take 80 mg by mouth 2 (two) times daily., Disp: , Rfl:  .  verapamil (CALAN-SR) 180 MG CR tablet, Take 180 mg by mouth at bedtime., Disp: , Rfl:  .  Vitamin D, Ergocalciferol, (DRISDOL) 50000 units CAPS capsule, TAKE 1 CAPSULE EVERY 7 DAYS, Disp: 12 capsule, Rfl: 0  Allergies  Allergen Reactions  . Isosorbide Dinitrate Other (See Comments)    Other reaction(s): severe headaches  . Prednisone Other (See Comments)    Heart palpitations  . Amoxicillin Swelling    Lip/throat swelling  . Sudafed [Pseudoephedrine] Other (See Comments)    Other reaction(s): rash  . Aspirin Other (See Comments)    Baby aspirin is tolerated, itching in the past  . Amoxicillin-Pot Clavulanate Swelling and Other (See Comments)    Other reaction(s): Facial and tounge swelling  . Cephalexin Other (See Comments)  . Percocet [Oxycodone-Acetaminophen] Other (See Comments)  . Sulfa Antibiotics Other (See Comments)  .  Sulfonamide Derivatives Other (See Comments)    Unknown reaction a long time ago   Review of Systems:   Review of Systems  Constitutional: Negative for chills and fever.  HENT: Negative for congestion, ear pain and sore throat.   Eyes: Negative for blurred vision.  Respiratory: Negative for cough and shortness of breath.   Cardiovascular: Negative for chest pain and palpitations.  Gastrointestinal: Negative for abdominal pain, nausea and vomiting.  Genitourinary: Negative for frequency.  Musculoskeletal: Negative for back pain and neck pain.  Skin: Negative for rash.  Neurological: Negative  for dizziness, loss of consciousness and headaches.   Vitals:   Vitals:   05/07/17 1459  BP: 124/78  Pulse: (!) 101  Temp: 97.6 F (36.4 C)  TempSrc: Oral  SpO2: 100%  Weight: 133 lb 12.8 oz (60.7 kg)  Height: 5\' 5"  (1.651 m)     Body mass index is 22.27 kg/m.  Physical Exam:   Physical Exam  Constitutional: She appears well-nourished.  HENT:  Head: Normocephalic and atraumatic.  Eyes: EOM are normal. Pupils are equal, round, and reactive to light.  Neck: Normal range of motion. Neck supple.  Cardiovascular: Normal rate, regular rhythm, normal heart sounds and intact distal pulses.   Pulmonary/Chest: Effort normal.  Abdominal: Soft.  Skin: Skin is warm.  Psychiatric: She has a normal mood and affect. Her behavior is normal.  Nursing note and vitals reviewed.   Results for orders placed or performed in visit on 72/62/03  Basic Metabolic Panel (BMET)  Result Value Ref Range   Glucose 89 65 - 99 mg/dL   BUN 22 8 - 27 mg/dL   Creatinine, Ser 1.17 (H) 0.57 - 1.00 mg/dL   GFR calc non Af Amer 43 (L) >59 mL/min/1.73   GFR calc Af Amer 50 (L) >59 mL/min/1.73   BUN/Creatinine Ratio 19 12 - 28   Sodium 140 134 - 144 mmol/L   Potassium 4.5 3.5 - 5.2 mmol/L   Chloride 98 96 - 106 mmol/L   CO2 26 18 - 29 mmol/L   Calcium 9.2 8.7 - 10.3 mg/dL  Pro b natriuretic peptide  Result  Value Ref Range   NT-Pro BNP 2,762 (H) 0 - 738 pg/mL   Assessment and Plan:   Adrien was seen today for establish care.  Diagnoses and all orders for this visit:  Gastroesophageal reflux disease, esophagitis presence not specified Comments: Patient is interested in de-prescribing. She states that she has a history of gastric ulcer though I cannot find that documented. She has been on a PPI for years. Okay trial Pepcid with intent to wean if not needed. Orders: -     famotidine (PEPCID) 20 MG tablet; Take 1 tablet (20 mg total) by mouth daily.  Anxiety and depression Comments: The patient is not interested in medication at this time. I did offer the option of therapy. Reviewed the importance of self care.  Caregiver with fatigue Comments: Husband with dementia. We reviewed what she should expect with this. A handout with resources for her and her husband was provided.   Acquired hypothyroidism Comments: Requesting records from previous provider.  Essential hypertension Comments: Followed by cardiology.    . Reviewed expectations re: course of current medical issues. . Discussed self-management of symptoms. . Outlined signs and symptoms indicating need for more acute intervention. . Patient verbalized understanding and all questions were answered. . See orders for this visit as documented in the electronic medical record. . Patient received an After Visit Summary.  Records requested if needed. I spent 45 minutes with this patient, greater than 50% was face-to-face time counseling regarding the above diagnoses.  CMA served as Education administrator during this visit. History, Physical, and Plan performed by medical provider. Documentation and orders reviewed and attested to. Briscoe Deutscher, D.O.  Briscoe Deutscher, DO [place] 05/08/2017  Future Appointments Date Time Provider [place]  08/07/2017 1:30 PM Briscoe Deutscher, DO [facility] None

## 2017-05-08 ENCOUNTER — Encounter: Payer: Self-pay | Admitting: Family Medicine

## 2017-05-08 DIAGNOSIS — M810 Age-related osteoporosis without current pathological fracture: Secondary | ICD-10-CM | POA: Insufficient documentation

## 2017-05-08 DIAGNOSIS — E559 Vitamin D deficiency, unspecified: Secondary | ICD-10-CM

## 2017-05-08 DIAGNOSIS — R51 Headache: Secondary | ICD-10-CM

## 2017-05-08 DIAGNOSIS — Z96659 Presence of unspecified artificial knee joint: Secondary | ICD-10-CM | POA: Insufficient documentation

## 2017-05-08 DIAGNOSIS — G4486 Cervicogenic headache: Secondary | ICD-10-CM | POA: Insufficient documentation

## 2017-05-08 DIAGNOSIS — F329 Major depressive disorder, single episode, unspecified: Secondary | ICD-10-CM | POA: Insufficient documentation

## 2017-05-08 DIAGNOSIS — I4891 Unspecified atrial fibrillation: Secondary | ICD-10-CM

## 2017-05-08 DIAGNOSIS — I6523 Occlusion and stenosis of bilateral carotid arteries: Secondary | ICD-10-CM | POA: Insufficient documentation

## 2017-05-08 DIAGNOSIS — I071 Rheumatic tricuspid insufficiency: Secondary | ICD-10-CM

## 2017-05-08 DIAGNOSIS — Z86718 Personal history of other venous thrombosis and embolism: Secondary | ICD-10-CM | POA: Insufficient documentation

## 2017-05-08 DIAGNOSIS — E039 Hypothyroidism, unspecified: Secondary | ICD-10-CM | POA: Insufficient documentation

## 2017-05-08 DIAGNOSIS — K219 Gastro-esophageal reflux disease without esophagitis: Secondary | ICD-10-CM | POA: Insufficient documentation

## 2017-05-08 DIAGNOSIS — F32A Depression, unspecified: Secondary | ICD-10-CM | POA: Insufficient documentation

## 2017-05-08 DIAGNOSIS — F419 Anxiety disorder, unspecified: Secondary | ICD-10-CM | POA: Insufficient documentation

## 2017-05-08 DIAGNOSIS — I253 Aneurysm of heart: Secondary | ICD-10-CM

## 2017-05-08 HISTORY — DX: Aneurysm of heart: I25.3

## 2017-05-08 HISTORY — DX: Rheumatic tricuspid insufficiency: I07.1

## 2017-05-08 HISTORY — DX: Unspecified atrial fibrillation: I48.91

## 2017-05-08 HISTORY — DX: Occlusion and stenosis of bilateral carotid arteries: I65.23

## 2017-05-08 HISTORY — DX: Vitamin D deficiency, unspecified: E55.9

## 2017-05-14 ENCOUNTER — Telehealth: Payer: Self-pay | Admitting: Internal Medicine

## 2017-05-14 DIAGNOSIS — H1131 Conjunctival hemorrhage, right eye: Secondary | ICD-10-CM | POA: Diagnosis not present

## 2017-05-14 DIAGNOSIS — R0609 Other forms of dyspnea: Secondary | ICD-10-CM | POA: Diagnosis not present

## 2017-05-14 NOTE — Telephone Encounter (Signed)
New message    Ebony Hail from [facility] is calling about the pt. She went there for elevated bp. Ebony Hail states that pt has red eyes like a blood vessel may have burst, fatigue, and sob w/ exertion and dull headache and strange chest pains or twinges lasting a few seconds every now and then. They want to fax over EKG from today.

## 2017-05-15 NOTE — Telephone Encounter (Signed)
Received patient as a walk in-requesting next avalaible appt.  States she has been feeling fatigue and had an episode yesterday where she became SOB and weak when walking and had to sit down. [facility] done (see prior note).  States they advised her to go to ER if episode occurred again.  Denies SOB, CP, weakness at current.  States she wishes she could of made it to appt offerred this AM but had to get her husband to his appt.   Reports she was seen by PCP recently as well.    Next available appt scheduled-5/29 at 3:30 PM.  Again advised if symptoms occur again or get worse to proceed with plan to go to ER.  Patient agreed with plan and verbalized understanding.

## 2017-05-15 NOTE — Telephone Encounter (Incomplete)
Called pt   She wnt to walk dog  Got winded  Had pains in chest  COuldnt breathe Went down to neighbors house  She cant remember BP  Low  Per pt   Went to [employer]   RIght now pt is tired   Chest discomfort  Tight in chest  Couldn't breathe  Panting  Felt dizzy   Night before had dzizness with near syncope Today when walking with cane  Feels tired  Walking slow

## 2017-05-15 NOTE — Telephone Encounter (Signed)
Spoke with patient to advise her that I have received ekg from [facility] clinic. The ekg was reviewed in comparison to her last ekg from our office on 01/11/16 by Dr. Radford Pax, DOD. Patient states she is trying to get out the door to take her husband to an appointment. She requests an appointment with Dr. Harrington Challenger.  I advised her that Dr. Harrington Challenger is not in the office today. The only opening today is at 10:30 with K. Grandville Silos, [place] and patient states she cannot be here that soon. She states she will call back to schedule an appointment with Dr. Harrington Challenger. She states she has been feeling fatigued lately.  I advised her that Dr. Radford Pax did not note any distinct changes on EKG from [facility]; I placed ekg in Dr. Alan Ripper mailbox for follow-up.

## 2017-05-19 ENCOUNTER — Telehealth: Payer: Self-pay | Admitting: Family Medicine

## 2017-05-19 ENCOUNTER — Encounter: Payer: Self-pay | Admitting: Internal Medicine

## 2017-05-19 ENCOUNTER — Emergency Department (HOSPITAL_COMMUNITY)
Admission: EM | Admit: 2017-05-19 | Discharge: 2017-05-19 | Discharge status: Absent without leave | Payer: Medicare Other

## 2017-05-19 ENCOUNTER — Ambulatory Visit (INDEPENDENT_AMBULATORY_CARE_PROVIDER_SITE_OTHER): Payer: Medicare Other | Admitting: Internal Medicine

## 2017-05-19 ENCOUNTER — Ambulatory Visit (HOSPITAL_COMMUNITY)
Admission: RE | Admit: 2017-05-19 | Discharge: 2017-05-19 | Disposition: A | Discharge status: Home / Self Care | Payer: Medicare Other | Source: Ambulatory Visit | Attending: Internal Medicine | Admitting: Internal Medicine

## 2017-05-19 VITALS — BP 124/70 | HR 95 | Ht 65.0 in | Wt 135.0 lb

## 2017-05-19 DIAGNOSIS — R7989 Other specified abnormal findings of blood chemistry: Secondary | ICD-10-CM | POA: Insufficient documentation

## 2017-05-19 DIAGNOSIS — R0602 Shortness of breath: Secondary | ICD-10-CM | POA: Insufficient documentation

## 2017-05-19 DIAGNOSIS — I2699 Other pulmonary embolism without acute cor pulmonale: Secondary | ICD-10-CM | POA: Diagnosis not present

## 2017-05-19 DIAGNOSIS — I2510 Atherosclerotic heart disease of native coronary artery without angina pectoris: Secondary | ICD-10-CM

## 2017-05-19 DIAGNOSIS — R0789 Other chest pain: Secondary | ICD-10-CM | POA: Diagnosis not present

## 2017-05-19 DIAGNOSIS — I4891 Unspecified atrial fibrillation: Secondary | ICD-10-CM | POA: Diagnosis not present

## 2017-05-19 DIAGNOSIS — E039 Hypothyroidism, unspecified: Secondary | ICD-10-CM | POA: Diagnosis not present

## 2017-05-19 DIAGNOSIS — I10 Essential (primary) hypertension: Secondary | ICD-10-CM | POA: Diagnosis not present

## 2017-05-19 LAB — D-DIMER, QUANTITATIVE (NOT AT ARMC): D-DIMER: 2.21 mg{FEU}/L — AB (ref 0.00–0.49)

## 2017-05-19 MED ORDER — IOPAMIDOL (ISOVUE-300) INJECTION 61%: 80.0000 mL | Freq: Once | INTRAVENOUS | Status: DC | PRN

## 2017-05-19 MED ORDER — IOPAMIDOL (ISOVUE-370) INJECTION 76%
INTRAVENOUS | Status: AC
  Administered 2017-05-19: 80 mL via INTRAVENOUS
  Filled 2017-05-19: qty 100

## 2017-05-19 NOTE — Patient Instructions (Signed)
Your physician recommends that you continue on your current medications as directed. Please refer to the Current Medication list given to you today. Your physician recommends that you return for lab work today (CBC, BMET, BNP, TSH, INR, D DIMER)

## 2017-05-19 NOTE — Progress Notes (Signed)
Cardiology Office Note   Date:  05/19/2017   ID:  [name] [name], DOB May 01, 1933, MRN [id number]  PCP:  Briscoe Deutscher, DO  Cardiologist:   Dorris Carnes, MD       History of Present Illness: [name] [name] is a 83 y.o. female with a history of HTN, moderate CAD by remote cath, atrial fib  I saw her in April   At that time she had some SOB   Echo done  LVEF 60 to 65%   Mild MR  Mod TR   Labs showed kidney function stable  Fluid up  I recomm lasix 60 mg per day The pt tood for a short time    She called in last week complaining of chest tightness, SOB  She was seen by primeary MD  EKG done  Atrial fib 85 bpm    Recomm she come in for f//u      Pt says she gets SOB  Does have some tightness with activity  Relieved by rest COmes in today with brother in law  He questions if she is taking meds as instructed  She did stop taking lasix        Current Meds  Medication Sig  . acetaminophen (TYLENOL) 500 MG tablet Take 500 mg by mouth every 6 (six) hours as needed for moderate pain.   Marland Kitchen atorvastatin (LIPITOR) 10 MG tablet Take 10 mg by mouth daily.  Marland Kitchen ELIQUIS 5 MG TABS tablet TAKE 1 TABLET BY MOUTH TWICE A DAY  . furosemide (LASIX) 20 MG tablet Take 3 tablets (60 mg total) by mouth daily. (Patient taking differently: Take 60 mg by mouth as directed. )  . levothyroxine (SYNTHROID, LEVOTHROID) 112 MCG tablet Take 112 mcg by mouth daily before breakfast.  . valsartan (DIOVAN) 80 MG tablet Take 80 mg by mouth 2 (two) times daily.  . verapamil (CALAN-SR) 180 MG CR tablet Take 180 mg by mouth at bedtime.     Allergies:   Isosorbide dinitrate; Prednisone; Amoxicillin; Sudafed [pseudoephedrine]; Aspirin; Amoxicillin-pot clavulanate; Cephalexin; Percocet [oxycodone-acetaminophen]; Sulfa antibiotics; and Sulfonamide derivatives   Past Medical History:  Diagnosis Date  . Allergic rhinitis, cause unspecified   . Anxiety and depression   . Atrial fibrillation ([place]) 05/08/2017  . Atrial septal  aneurysm 05/08/2017  . Avitaminosis D 05/08/2017  . Carotid artery plaque, bilateral 05/08/2017  . Chronic headaches   . Chronic venous insufficiency 04/28/2015  . Constipation 08/29/2014  . Coronary artery disease   . DJD (degenerative joint disease)   . Family history of malignant neoplasm of gastrointestinal tract   . GERD (gastroesophageal reflux disease)   . H/O: hysterectomy   . Hyperlipidemia   . Hypertension   . Hypothyroidism   . Internal hemorrhoids 07/30/2011   Grade 2 hemorrhoids 10/19/14 band ligation right posterior hemorrhoidal bundle 12/07/14 band ligation right anterior hemorrhoidal bundle  02/03/15  band ligation left lateral hemorrhoidal bundle   . Orthostatic hypotension   . Osteoarthrosis, unspecified whether generalized or localized, unspecified site   . Osteoporosis   . Postthrombotic syndrome 06/23/2012   Left Leg   . Rectal bleeding 08/29/2014  . TI (tricuspid incompetence) 05/08/2017  . Unspecified functional disorder of intestine   . Varicose veins of leg with complications 4/97/0263    Past Surgical History:  Procedure Laterality Date  . ABDOMINAL HYSTERECTOMY    . APPENDECTOMY    . CARDIAC CATHETERIZATION    . CATARACT EXTRACTION W/ INTRAOCULAR LENS IMPLANT Bilateral   .  CERVICAL SPINE SURGERY    . cyst removal on back    . GANGLION CYST EXCISION Bilateral   . KNEE SURGERY Left   . NASAL SINUS SURGERY    . TONSILLECTOMY    . TOTAL KNEE ARTHROPLASTY Right      Social History:  The patient  reports that she has never smoked. She has never used smokeless tobacco. She reports that she does not drink alcohol or use drugs.   Family History:  The patient's family history includes Brain cancer in her other; Cancer in her mother; Colon cancer in her mother; Heart attack (age of onset: 46) in her father; Heart disease in her father, maternal aunt, and maternal uncle; Varicose Veins in her mother.    ROS:  Please see the history of present illness. All other  systems are reviewed and  Negative to the above problem except as noted.    PHYSICAL EXAM: VS:  BP 124/70   Pulse 95   Ht 5\' 5"  (1.651 m)   Wt 135 lb (61.2 kg)   SpO2 98%   BMI 22.47 kg/m   GEN: Well nourished, well developed, in no acute distress  HEENT: normal  Neck: Mild increase JVP, carotid bruits, or masses Cardiac:  Irreg irreg  ; no murmurs, rubs, or gallops,1+   edema  Respiratory:  clear to auscultation bilaterally, normal work of breathing GI: soft, nontender, nondistended, + BS  No hepatomegaly  MS: no deformity Moving all extremities   Skin: warm and dry, no rash Neuro:  Strength and sensation are intact Psych: euthymic mood, full affect   EKG:  EKG is not  ordered today.   Lipid Panel    Component Value Date/Time   CHOL 155 03/17/2017 1043   TRIG 75 03/17/2017 1043   TRIG 128 12/26/2006 1227   HDL 67 03/17/2017 1043   CHOLHDL 2.3 03/17/2017 1043   CHOLHDL 3.3 11/18/2016 1016   VLDL 25 11/18/2016 1016   LDLCALC 73 03/17/2017 1043      Wt Readings from Last 3 Encounters:  05/19/17 135 lb (61.2 kg)  05/07/17 133 lb 12.8 oz (60.7 kg)  04/07/17 136 lb 1.9 oz (61.7 kg)      ASSESSMENT AND PLAN:  1  Chest tightness/SOB  Volume is up a little but at times symtpoms seem out of proportion to this  ? Worsening CAD with ischemia I would recomm checking labs  Will check with pharmacy Check D Dimer   Check BNP   2  CAD  Mod on cath remoted  3  Chr atrial fib  On Eliqus  Rate appears to be OK    4  HL  Continue statin     Current medicines are reviewed at length with the patient today.  The patient does not have concerns regarding medicines.  Signed, Dorris Carnes, MD  05/19/2017 1:03 PM    [address], [place], [place]  [address] Phone: [phone]; Fax: [phone]  \

## 2017-05-19 NOTE — Telephone Encounter (Signed)
Pt to come today   Need CBC, BMET, BNP and INR d dimer I will also plan to see

## 2017-05-19 NOTE — Telephone Encounter (Signed)
Per Dr. Harrington Challenger, added patient to DOD schedule today.  Per Dr. Harrington Challenger patient is aware to come at 12N.

## 2017-05-19 NOTE — Telephone Encounter (Signed)
Called pt to schedule AWV. No answer, just kept ringing. Will try again later.

## 2017-05-19 NOTE — Telephone Encounter (Signed)
AWV appt on hold for 08/07/17 with Deanna Artis, RN if pt agrees to appt.

## 2017-05-20 LAB — BASIC METABOLIC PANEL
BUN / CREAT RATIO: 27 (ref 12–28)
BUN: 32 mg/dL — ABNORMAL HIGH (ref 8–27)
CO2: 25 mmol/L (ref 18–29)
Calcium: 9.2 mg/dL (ref 8.7–10.3)
Chloride: 97 mmol/L (ref 96–106)
Creatinine, Ser: 1.2 mg/dL — ABNORMAL HIGH (ref 0.57–1.00)
GFR calc Af Amer: 49 mL/min/{1.73_m2} — ABNORMAL LOW (ref >59)
GFR, EST NON AFRICAN AMERICAN: 42 mL/min/{1.73_m2} — AB (ref >59)
GLUCOSE: 75 mg/dL (ref 65–99)
POTASSIUM: 3.5 mmol/L (ref 3.5–5.2)
SODIUM: 139 mmol/L (ref 134–144)

## 2017-05-20 LAB — CBC
Hematocrit: 35.3 % (ref 34.0–46.6)
Hemoglobin: 11.2 g/dL (ref 11.1–15.9)
MCH: 26.5 pg — AB (ref 26.6–33.0)
MCHC: 31.7 g/dL (ref 31.5–35.7)
MCV: 84 fL (ref 79–97)
Platelets: 281 10*3/uL (ref 150–379)
RBC: 4.23 x10E6/uL (ref 3.77–5.28)
RDW: 15.6 % — AB (ref 12.3–15.4)
WBC: 7.1 10*3/uL (ref 3.4–10.8)

## 2017-05-20 LAB — PROTIME-INR
INR: 1.2 (ref 0.8–1.2)
Prothrombin Time: 12.9 s — ABNORMAL HIGH (ref 9.1–12.0)

## 2017-05-20 LAB — PRO B NATRIURETIC PEPTIDE: NT-Pro BNP: 2655 pg/mL — ABNORMAL HIGH (ref 0–738)

## 2017-05-20 LAB — TSH: TSH: 6.14 u[IU]/mL — AB (ref 0.450–4.500)

## 2017-05-22 ENCOUNTER — Telehealth: Payer: Self-pay | Admitting: *Deleted

## 2017-05-22 DIAGNOSIS — R0602 Shortness of breath: Secondary | ICD-10-CM

## 2017-05-22 DIAGNOSIS — R7989 Other specified abnormal findings of blood chemistry: Secondary | ICD-10-CM

## 2017-05-22 MED ORDER — FUROSEMIDE 40 MG PO TABS: 40.0000 mg | ORAL_TABLET | Freq: Every day | ORAL | 3 refills | Status: AC

## 2017-05-22 MED ORDER — POTASSIUM CHLORIDE ER 10 MEQ PO TBCR: 10.0000 meq | EXTENDED_RELEASE_TABLET | Freq: Every day | ORAL | 3 refills | Status: AC

## 2017-05-22 MED ORDER — POTASSIUM CHLORIDE ER 10 MEQ PO TBCR: 10.0000 meq | EXTENDED_RELEASE_TABLET | Freq: Every day | ORAL | 3 refills | Status: DC

## 2017-05-22 MED ORDER — FUROSEMIDE 40 MG PO TABS: 40.0000 mg | ORAL_TABLET | Freq: Every day | ORAL | 3 refills | Status: DC

## 2017-05-22 NOTE — Telephone Encounter (Signed)
Informed patient.  Sent prescriptions for lasix and potassium and reviewed instructions for taking these and for repeat labs on 06/02/17.  Pt will pick up today when she feels better.  She is "worn out" and has been sleeping most of today. Advised to call back if feels worse or develops other symptoms. She is appreciative for the call/info.

## 2017-05-22 NOTE — Telephone Encounter (Signed)
-----   Message from Dorris Carnes V, MD sent at 05/20/2017  6:10 PM EDT ----- Called pt  CBC OK Fluid is Up Thyroid a little off Recomm:  Lasix 40 with 10 KCL daily   Repeat BMET and BNP in 10 days  Check free T3, free T4 at that time Will call into [facility]

## 2017-05-23 NOTE — Progress Notes (Signed)
Check on patient.

## 2017-05-27 ENCOUNTER — Ambulatory Visit: Payer: Medicare Other | Admitting: Physician Assistant

## 2017-05-29 ENCOUNTER — Inpatient Hospital Stay (HOSPITAL_COMMUNITY): Payer: Medicare Other

## 2017-05-29 ENCOUNTER — Encounter (HOSPITAL_COMMUNITY): Admission: EM | Disposition: E | Payer: Self-pay | Source: Home / Self Care | Attending: Emergency Medicine

## 2017-05-29 ENCOUNTER — Emergency Department (HOSPITAL_COMMUNITY): Payer: Medicare Other

## 2017-05-29 ENCOUNTER — Inpatient Hospital Stay (HOSPITAL_COMMUNITY)
Admission: EM | Admit: 2017-05-29 | Discharge: 2017-06-29 | DRG: 308 | Disposition: E | Payer: Medicare Other | Attending: Emergency Medicine | Admitting: Emergency Medicine

## 2017-05-29 DIAGNOSIS — F329 Major depressive disorder, single episode, unspecified: Secondary | ICD-10-CM | POA: Diagnosis present

## 2017-05-29 DIAGNOSIS — E559 Vitamin D deficiency, unspecified: Secondary | ICD-10-CM | POA: Diagnosis present

## 2017-05-29 DIAGNOSIS — I5021 Acute systolic (congestive) heart failure: Secondary | ICD-10-CM | POA: Diagnosis present

## 2017-05-29 DIAGNOSIS — Z01818 Encounter for other preprocedural examination: Secondary | ICD-10-CM

## 2017-05-29 DIAGNOSIS — I253 Aneurysm of heart: Secondary | ICD-10-CM | POA: Diagnosis present

## 2017-05-29 DIAGNOSIS — Z4682 Encounter for fitting and adjustment of non-vascular catheter: Secondary | ICD-10-CM | POA: Diagnosis not present

## 2017-05-29 DIAGNOSIS — E039 Hypothyroidism, unspecified: Secondary | ICD-10-CM | POA: Diagnosis present

## 2017-05-29 DIAGNOSIS — Z96651 Presence of right artificial knee joint: Secondary | ICD-10-CM | POA: Diagnosis present

## 2017-05-29 DIAGNOSIS — R42 Dizziness and giddiness: Secondary | ICD-10-CM

## 2017-05-29 DIAGNOSIS — M810 Age-related osteoporosis without current pathological fracture: Secondary | ICD-10-CM | POA: Diagnosis present

## 2017-05-29 DIAGNOSIS — K7200 Acute and subacute hepatic failure without coma: Secondary | ICD-10-CM | POA: Diagnosis present

## 2017-05-29 DIAGNOSIS — N179 Acute kidney failure, unspecified: Secondary | ICD-10-CM

## 2017-05-29 DIAGNOSIS — Z79899 Other long term (current) drug therapy: Secondary | ICD-10-CM

## 2017-05-29 DIAGNOSIS — Z8249 Family history of ischemic heart disease and other diseases of the circulatory system: Secondary | ICD-10-CM

## 2017-05-29 DIAGNOSIS — K219 Gastro-esophageal reflux disease without esophagitis: Secondary | ICD-10-CM | POA: Diagnosis present

## 2017-05-29 DIAGNOSIS — R579 Shock, unspecified: Secondary | ICD-10-CM | POA: Diagnosis present

## 2017-05-29 DIAGNOSIS — R001 Bradycardia, unspecified: Secondary | ICD-10-CM | POA: Diagnosis not present

## 2017-05-29 DIAGNOSIS — J309 Allergic rhinitis, unspecified: Secondary | ICD-10-CM | POA: Diagnosis present

## 2017-05-29 DIAGNOSIS — Z881 Allergy status to other antibiotic agents status: Secondary | ICD-10-CM

## 2017-05-29 DIAGNOSIS — R0902 Hypoxemia: Secondary | ICD-10-CM

## 2017-05-29 DIAGNOSIS — I4891 Unspecified atrial fibrillation: Secondary | ICD-10-CM | POA: Diagnosis not present

## 2017-05-29 DIAGNOSIS — E872 Acidosis: Secondary | ICD-10-CM | POA: Diagnosis present

## 2017-05-29 DIAGNOSIS — R110 Nausea: Secondary | ICD-10-CM | POA: Diagnosis present

## 2017-05-29 DIAGNOSIS — I6523 Occlusion and stenosis of bilateral carotid arteries: Secondary | ICD-10-CM | POA: Diagnosis present

## 2017-05-29 DIAGNOSIS — G9340 Encephalopathy, unspecified: Secondary | ICD-10-CM | POA: Diagnosis not present

## 2017-05-29 DIAGNOSIS — I959 Hypotension, unspecified: Secondary | ICD-10-CM | POA: Diagnosis not present

## 2017-05-29 DIAGNOSIS — Z886 Allergy status to analgesic agent status: Secondary | ICD-10-CM

## 2017-05-29 DIAGNOSIS — A419 Sepsis, unspecified organism: Secondary | ICD-10-CM

## 2017-05-29 DIAGNOSIS — R0600 Dyspnea, unspecified: Secondary | ICD-10-CM | POA: Diagnosis not present

## 2017-05-29 DIAGNOSIS — R0602 Shortness of breath: Secondary | ICD-10-CM | POA: Diagnosis not present

## 2017-05-29 DIAGNOSIS — Z90710 Acquired absence of both cervix and uterus: Secondary | ICD-10-CM

## 2017-05-29 DIAGNOSIS — J9811 Atelectasis: Secondary | ICD-10-CM | POA: Diagnosis not present

## 2017-05-29 DIAGNOSIS — Z888 Allergy status to other drugs, medicaments and biological substances status: Secondary | ICD-10-CM

## 2017-05-29 DIAGNOSIS — Z961 Presence of intraocular lens: Secondary | ICD-10-CM | POA: Diagnosis present

## 2017-05-29 DIAGNOSIS — I361 Nonrheumatic tricuspid (valve) insufficiency: Secondary | ICD-10-CM | POA: Diagnosis present

## 2017-05-29 DIAGNOSIS — J9601 Acute respiratory failure with hypoxia: Secondary | ICD-10-CM | POA: Diagnosis not present

## 2017-05-29 DIAGNOSIS — Z882 Allergy status to sulfonamides status: Secondary | ICD-10-CM

## 2017-05-29 DIAGNOSIS — I482 Chronic atrial fibrillation: Secondary | ICD-10-CM | POA: Diagnosis present

## 2017-05-29 DIAGNOSIS — I499 Cardiac arrhythmia, unspecified: Secondary | ICD-10-CM | POA: Diagnosis not present

## 2017-05-29 DIAGNOSIS — I2510 Atherosclerotic heart disease of native coronary artery without angina pectoris: Secondary | ICD-10-CM | POA: Diagnosis present

## 2017-05-29 DIAGNOSIS — I10 Essential (primary) hypertension: Secondary | ICD-10-CM | POA: Diagnosis present

## 2017-05-29 DIAGNOSIS — Z452 Encounter for adjustment and management of vascular access device: Secondary | ICD-10-CM

## 2017-05-29 DIAGNOSIS — I130 Hypertensive heart and chronic kidney disease with heart failure and stage 1 through stage 4 chronic kidney disease, or unspecified chronic kidney disease: Secondary | ICD-10-CM | POA: Diagnosis present

## 2017-05-29 DIAGNOSIS — Z8262 Family history of osteoporosis: Secondary | ICD-10-CM

## 2017-05-29 DIAGNOSIS — D631 Anemia in chronic kidney disease: Secondary | ICD-10-CM | POA: Diagnosis present

## 2017-05-29 DIAGNOSIS — Z86718 Personal history of other venous thrombosis and embolism: Secondary | ICD-10-CM

## 2017-05-29 DIAGNOSIS — I872 Venous insufficiency (chronic) (peripheral): Secondary | ICD-10-CM | POA: Diagnosis present

## 2017-05-29 DIAGNOSIS — F419 Anxiety disorder, unspecified: Secondary | ICD-10-CM | POA: Diagnosis present

## 2017-05-29 DIAGNOSIS — Z7901 Long term (current) use of anticoagulants: Secondary | ICD-10-CM

## 2017-05-29 DIAGNOSIS — J9600 Acute respiratory failure, unspecified whether with hypoxia or hypercapnia: Secondary | ICD-10-CM

## 2017-05-29 DIAGNOSIS — E785 Hyperlipidemia, unspecified: Secondary | ICD-10-CM | POA: Diagnosis present

## 2017-05-29 DIAGNOSIS — Z885 Allergy status to narcotic agent status: Secondary | ICD-10-CM

## 2017-05-29 DIAGNOSIS — R531 Weakness: Secondary | ICD-10-CM | POA: Diagnosis not present

## 2017-05-29 DIAGNOSIS — N183 Chronic kidney disease, stage 3 (moderate): Secondary | ICD-10-CM | POA: Diagnosis present

## 2017-05-29 LAB — CBC WITH DIFFERENTIAL/PLATELET
Basophils Absolute: 0 10*3/uL (ref 0.0–0.1)
Basophils Relative: 1 %
Eosinophils Absolute: 0 10*3/uL (ref 0.0–0.7)
Eosinophils Relative: 1 %
HEMATOCRIT: 34.4 % — AB (ref 36.0–46.0)
HEMOGLOBIN: 10.4 g/dL — AB (ref 12.0–15.0)
LYMPHS PCT: 18 %
Lymphs Abs: 1.5 10*3/uL (ref 0.7–4.0)
MCH: 25.5 pg — ABNORMAL LOW (ref 26.0–34.0)
MCHC: 30.2 g/dL (ref 30.0–36.0)
MCV: 84.3 fL (ref 78.0–100.0)
MONO ABS: 0.7 10*3/uL (ref 0.1–1.0)
Monocytes Relative: 8 %
NEUTROS ABS: 6.4 10*3/uL (ref 1.7–7.7)
NEUTROS PCT: 72 %
PLATELETS: 279 10*3/uL (ref 150–400)
RBC: 4.08 MIL/uL (ref 3.87–5.11)
RDW: 16.5 % — ABNORMAL HIGH (ref 11.5–15.5)
WBC: 8.7 10*3/uL (ref 4.0–10.5)

## 2017-05-29 LAB — BLOOD GAS, ARTERIAL
Acid-base deficit: 25.5 mmol/L — ABNORMAL HIGH (ref 0.0–2.0)
Bicarbonate: 3.8 mmol/L — ABNORMAL LOW (ref 20.0–28.0)
DRAWN BY: 41875
FIO2: 100
MECHVT: 460 mL
O2 Saturation: 93.2 %
PATIENT TEMPERATURE: 98.6
PEEP/CPAP: 5 cmH2O
PO2 ART: 119 mmHg — AB (ref 83.0–108.0)
RATE: 28 resp/min
pH, Arterial: 6.981 — CL (ref 7.350–7.450)

## 2017-05-29 LAB — TROPONIN I
Troponin I: <0.03 ng/mL (ref <0.03)
Troponin I: <0.03 ng/mL (ref <0.03)

## 2017-05-29 LAB — URINALYSIS, ROUTINE W REFLEX MICROSCOPIC
BILIRUBIN URINE: NEGATIVE
Glucose, UA: NEGATIVE mg/dL
HGB URINE DIPSTICK: NEGATIVE
Ketones, ur: NEGATIVE mg/dL
Leukocytes, UA: NEGATIVE
Nitrite: NEGATIVE
PROTEIN: 100 mg/dL — AB
Specific Gravity, Urine: 1.018 (ref 1.005–1.030)
pH: 5 (ref 5.0–8.0)

## 2017-05-29 LAB — I-STAT ARTERIAL BLOOD GAS, ED
ACID-BASE DEFICIT: 10 mmol/L — AB (ref 0.0–2.0)
Acid-base deficit: 15 mmol/L — ABNORMAL HIGH (ref 0.0–2.0)
Acid-base deficit: 24 mmol/L — ABNORMAL HIGH (ref 0.0–2.0)
BICARBONATE: 10.5 mmol/L — AB (ref 20.0–28.0)
Bicarbonate: 14.1 mmol/L — ABNORMAL LOW (ref 20.0–28.0)
Bicarbonate: 4.8 mmol/L — ABNORMAL LOW (ref 20.0–28.0)
O2 SAT: 87 %
O2 Saturation: 86 %
O2 Saturation: 98 %
PCO2 ART: 17.9 mmHg — AB (ref 32.0–48.0)
PH ART: 7.335 — AB (ref 7.350–7.450)
PH ART: 7.252 — AB (ref 7.350–7.450)
PH ART: 7.035 — AB (ref 7.350–7.450)
PO2 ART: 55 mmHg — AB (ref 83.0–108.0)
TCO2: 15 mmol/L (ref 0–100)
TCO2: 11 mmol/L (ref 0–100)
TCO2: 5 mmol/L (ref 0–100)
pCO2 arterial: 26.5 mmHg — ABNORMAL LOW (ref 32.0–48.0)
pCO2 arterial: 23.9 mmHg — ABNORMAL LOW (ref 32.0–48.0)
pO2, Arterial: 58 mmHg — ABNORMAL LOW (ref 83.0–108.0)
pO2, Arterial: 156 mmHg — ABNORMAL HIGH (ref 83.0–108.0)

## 2017-05-29 LAB — BASIC METABOLIC PANEL
ANION GAP: 15 (ref 5–15)
ANION GAP: 20 — AB (ref 5–15)
BUN: 34 mg/dL — ABNORMAL HIGH (ref 6–20)
BUN: 37 mg/dL — ABNORMAL HIGH (ref 6–20)
BUN: 37 mg/dL — ABNORMAL HIGH (ref 6–20)
CALCIUM: 9.1 mg/dL (ref 8.9–10.3)
CALCIUM: 8.2 mg/dL — AB (ref 8.9–10.3)
CO2: 17 mmol/L — ABNORMAL LOW (ref 22–32)
CO2: 11 mmol/L — AB (ref 22–32)
CO2: <7 mmol/L — ABNORMAL LOW (ref 22–32)
CREATININE: 2.47 mg/dL — AB (ref 0.44–1.00)
Calcium: 8.9 mg/dL (ref 8.9–10.3)
Chloride: 101 mmol/L (ref 101–111)
Chloride: 106 mmol/L (ref 101–111)
Chloride: 103 mmol/L (ref 101–111)
Creatinine, Ser: 1.93 mg/dL — ABNORMAL HIGH (ref 0.44–1.00)
Creatinine, Ser: 2.85 mg/dL — ABNORMAL HIGH (ref 0.44–1.00)
GFR calc Af Amer: 27 mL/min — ABNORMAL LOW (ref >60)
GFR, EST AFRICAN AMERICAN: 20 mL/min — AB (ref >60)
GFR, EST AFRICAN AMERICAN: 17 mL/min — AB (ref >60)
GFR, EST NON AFRICAN AMERICAN: 23 mL/min — AB (ref >60)
GFR, EST NON AFRICAN AMERICAN: 17 mL/min — AB (ref >60)
GFR, EST NON AFRICAN AMERICAN: 14 mL/min — AB (ref >60)
Glucose, Bld: 122 mg/dL — ABNORMAL HIGH (ref 65–99)
Glucose, Bld: 98 mg/dL (ref 65–99)
Glucose, Bld: 183 mg/dL — ABNORMAL HIGH (ref 65–99)
POTASSIUM: 4.7 mmol/L (ref 3.5–5.1)
POTASSIUM: 4.8 mmol/L (ref 3.5–5.1)
Potassium: 5.2 mmol/L — ABNORMAL HIGH (ref 3.5–5.1)
SODIUM: 133 mmol/L — AB (ref 135–145)
SODIUM: 137 mmol/L (ref 135–145)
SODIUM: 133 mmol/L — AB (ref 135–145)

## 2017-05-29 LAB — I-STAT CG4 LACTIC ACID, ED: Lactic Acid, Venous: 2.81 mmol/L (ref 0.5–1.9)

## 2017-05-29 LAB — RAPID URINE DRUG SCREEN, HOSP PERFORMED
AMPHETAMINES: NOT DETECTED
BENZODIAZEPINES: NOT DETECTED
Barbiturates: NOT DETECTED
Cocaine: NOT DETECTED
OPIATES: NOT DETECTED
Tetrahydrocannabinol: NOT DETECTED

## 2017-05-29 LAB — HEPATIC FUNCTION PANEL
ALBUMIN: 3.2 g/dL — AB (ref 3.5–5.0)
ALT: 108 U/L — ABNORMAL HIGH (ref 14–54)
AST: 216 U/L — AB (ref 15–41)
Alkaline Phosphatase: 125 U/L (ref 38–126)
BILIRUBIN DIRECT: 1 mg/dL — AB (ref 0.1–0.5)
Indirect Bilirubin: 1.5 mg/dL — ABNORMAL HIGH (ref 0.3–0.9)
Total Bilirubin: 2.5 mg/dL — ABNORMAL HIGH (ref 0.3–1.2)
Total Protein: 5.5 g/dL — ABNORMAL LOW (ref 6.5–8.1)

## 2017-05-29 LAB — POCT I-STAT 3, ART BLOOD GAS (G3+)
Acid-base deficit: 28 mmol/L — ABNORMAL HIGH (ref 0.0–2.0)
BICARBONATE: 3.7 mmol/L — AB (ref 20.0–28.0)
O2 Saturation: 99 %
PO2 ART: 214 mmHg — AB (ref 83.0–108.0)
TCO2: <5 mmol/L (ref 0–100)
pCO2 arterial: 19.3 mmHg — CL (ref 32.0–48.0)
pH, Arterial: 6.887 — CL (ref 7.350–7.450)

## 2017-05-29 LAB — LACTIC ACID, PLASMA
LACTIC ACID, VENOUS: 5.3 mmol/L — AB (ref 0.5–1.9)
LACTIC ACID, VENOUS: 15.3 mmol/L — AB (ref 0.5–1.9)
Lactic Acid, Venous: 7.2 mmol/L (ref 0.5–1.9)
Lactic Acid, Venous: 12.9 mmol/L (ref 0.5–1.9)

## 2017-05-29 LAB — D-DIMER, QUANTITATIVE: D-Dimer, Quant: 1.86 ug/mL-FEU — ABNORMAL HIGH (ref 0.00–0.50)

## 2017-05-29 LAB — I-STAT TROPONIN, ED: TROPONIN I, POC: 0.01 ng/mL (ref 0.00–0.08)

## 2017-05-29 LAB — APTT: APTT: 39 s — AB (ref 24–36)

## 2017-05-29 LAB — GLUCOSE, CAPILLARY: GLUCOSE-CAPILLARY: 161 mg/dL — AB (ref 65–99)

## 2017-05-29 LAB — TSH: TSH: 2.036 u[IU]/mL (ref 0.350–4.500)

## 2017-05-29 LAB — CBG MONITORING, ED
GLUCOSE-CAPILLARY: 103 mg/dL — AB (ref 65–99)
GLUCOSE-CAPILLARY: 82 mg/dL (ref 65–99)

## 2017-05-29 LAB — T4, FREE: FREE T4: 2.69 ng/dL — AB (ref 0.61–1.12)

## 2017-05-29 LAB — PROCALCITONIN

## 2017-05-29 LAB — PHOSPHORUS: PHOSPHORUS: 6.7 mg/dL — AB (ref 2.5–4.6)

## 2017-05-29 LAB — BRAIN NATRIURETIC PEPTIDE: B NATRIURETIC PEPTIDE 5: 373.1 pg/mL — AB (ref 0.0–100.0)

## 2017-05-29 LAB — HEPARIN LEVEL (UNFRACTIONATED): Heparin Unfractionated: >2.2 IU/mL — ABNORMAL HIGH (ref 0.30–0.70)

## 2017-05-29 LAB — CORTISOL: CORTISOL PLASMA: 57.4 ug/dL

## 2017-05-29 LAB — MAGNESIUM: MAGNESIUM: 2.1 mg/dL (ref 1.7–2.4)

## 2017-05-29 SURGERY — TEMPORARY PACEMAKER
Anesthesia: LOCAL

## 2017-05-29 MED ORDER — NOREPINEPHRINE BITARTRATE 1 MG/ML IV SOLN
0.0000 ug/min | INTRAVENOUS | Status: DC
  Administered 2017-05-29: 60 ug/min via INTRAVENOUS
  Administered 2017-05-30: 80 ug/min via INTRAVENOUS
  Administered 2017-05-30: 100 ug/min via INTRAVENOUS
  Administered 2017-05-30: 80 ug/min via INTRAVENOUS
  Administered 2017-05-30: 100 ug/min via INTRAVENOUS
  Filled 2017-05-29 (×8): qty 16

## 2017-05-29 MED ORDER — MIDAZOLAM HCL 2 MG/2ML IJ SOLN
1.0000 mg | INTRAMUSCULAR | Status: DC | PRN
  Filled 2017-05-29: qty 2

## 2017-05-29 MED ORDER — FENTANYL 2500MCG IN NS 250ML (10MCG/ML) PREMIX INFUSION
50.0000 ug/h | INTRAVENOUS | Status: DC
  Administered 2017-05-29: 50 ug/h via INTRAVENOUS
  Filled 2017-05-29: qty 250

## 2017-05-29 MED ORDER — SODIUM CHLORIDE 0.9 % IV BOLUS (SEPSIS)
1000.0000 mL | Freq: Once | INTRAVENOUS | Status: AC
  Administered 2017-05-29: 1000 mL via INTRAVENOUS

## 2017-05-29 MED ORDER — HEPARIN (PORCINE) IN NACL 100-0.45 UNIT/ML-% IJ SOLN: 700.0000 [IU]/h | INTRAMUSCULAR | Status: DC

## 2017-05-29 MED ORDER — IOPAMIDOL (ISOVUE-300) INJECTION 61%
INTRAVENOUS | Status: AC
  Administered 2017-05-29: 30 mL
  Filled 2017-05-29: qty 30

## 2017-05-29 MED ORDER — ONDANSETRON HCL 4 MG/2ML IJ SOLN
4.0000 mg | INTRAMUSCULAR | Status: AC
  Administered 2017-05-29: 4 mg via INTRAVENOUS
  Filled 2017-05-29: qty 2

## 2017-05-29 MED ORDER — STERILE WATER FOR INJECTION IV SOLN
INTRAVENOUS | Status: DC
  Administered 2017-05-29 – 2017-05-30 (×2): via INTRAVENOUS
  Filled 2017-05-29 (×9): qty 1700

## 2017-05-29 MED ORDER — SODIUM CHLORIDE 0.9 % IV SOLN: INTRAVENOUS | Status: DC | PRN

## 2017-05-29 MED ORDER — SODIUM CHLORIDE 0.9 % IV SOLN: 250.0000 mL | INTRAVENOUS | Status: DC | PRN

## 2017-05-29 MED ORDER — DEXTROSE 5 % IV SOLN
0.0000 ug/min | INTRAVENOUS | Status: DC
  Administered 2017-05-29 (×2): 20 ug/min via INTRAVENOUS
  Administered 2017-05-29: 10 ug/min via INTRAVENOUS
  Filled 2017-05-29 (×3): qty 4

## 2017-05-29 MED ORDER — HEPARIN (PORCINE) IN NACL 100-0.45 UNIT/ML-% IJ SOLN
900.0000 [IU]/h | INTRAMUSCULAR | Status: DC
  Administered 2017-05-29 (×2): 900 [IU]/h via INTRAVENOUS
  Filled 2017-05-29: qty 250

## 2017-05-29 MED ORDER — ATORVASTATIN CALCIUM 10 MG PO TABS: 10.0000 mg | ORAL_TABLET | Freq: Every day | ORAL | Status: DC

## 2017-05-29 MED ORDER — PANTOPRAZOLE SODIUM 40 MG IV SOLR
40.0000 mg | INTRAVENOUS | Status: DC
  Administered 2017-05-29: 40 mg via INTRAVENOUS
  Filled 2017-05-29: qty 40

## 2017-05-29 MED ORDER — LEVOTHYROXINE SODIUM 100 MCG IV SOLR
56.0000 ug | Freq: Every day | INTRAVENOUS | Status: DC
  Administered 2017-05-30: 56 ug via INTRAVENOUS
  Filled 2017-05-29: qty 5

## 2017-05-29 MED ORDER — SODIUM CHLORIDE 0.9% FLUSH
3.0000 mL | Freq: Two times a day (BID) | INTRAVENOUS | Status: DC
  Administered 2017-05-29 – 2017-05-30 (×3): 3 mL via INTRAVENOUS

## 2017-05-29 MED ORDER — ETOMIDATE 2 MG/ML IV SOLN
INTRAVENOUS | Status: AC | PRN
  Administered 2017-05-29: 20 mg via INTRAVENOUS

## 2017-05-29 MED ORDER — LEVOTHYROXINE SODIUM 112 MCG PO TABS
112.0000 ug | ORAL_TABLET | Freq: Every day | ORAL | Status: DC
  Filled 2017-05-29: qty 1

## 2017-05-29 MED ORDER — HEPARIN (PORCINE) IN NACL 2-0.9 UNIT/ML-% IJ SOLN
INTRAMUSCULAR | Status: AC
  Filled 2017-05-29: qty 500

## 2017-05-29 MED ORDER — SODIUM CHLORIDE 0.9 % IV BOLUS (SEPSIS)
500.0000 mL | Freq: Once | INTRAVENOUS | Status: AC
  Administered 2017-05-29: 500 mL via INTRAVENOUS

## 2017-05-29 MED ORDER — FENTANYL CITRATE (PF) 100 MCG/2ML IJ SOLN
50.0000 ug | Freq: Once | INTRAMUSCULAR | Status: AC
  Administered 2017-05-29: 50 ug via INTRAVENOUS

## 2017-05-29 MED ORDER — SODIUM CHLORIDE 0.9 % IV SOLN
1.0000 g | Freq: Once | INTRAVENOUS | Status: AC
  Administered 2017-05-29: 1 g via INTRAVENOUS
  Filled 2017-05-29: qty 10

## 2017-05-29 MED ORDER — LIDOCAINE HCL (PF) 1 % IJ SOLN
INTRAMUSCULAR | Status: AC
  Filled 2017-05-29: qty 30

## 2017-05-29 MED ORDER — ACETAMINOPHEN 500 MG PO TABS: 500.0000 mg | ORAL_TABLET | Freq: Four times a day (QID) | ORAL | Status: DC | PRN

## 2017-05-29 MED ORDER — DOPAMINE-DEXTROSE 3.2-5 MG/ML-% IV SOLN
0.0000 ug/kg/min | INTRAVENOUS | Status: DC
  Administered 2017-05-29: 5 ug/kg/min via INTRAVENOUS
  Filled 2017-05-29 (×4): qty 250

## 2017-05-29 MED ORDER — ONDANSETRON HCL 4 MG/2ML IJ SOLN: 4.0000 mg | Freq: Four times a day (QID) | INTRAMUSCULAR | Status: DC | PRN

## 2017-05-29 MED ORDER — FENTANYL CITRATE (PF) 100 MCG/2ML IJ SOLN
INTRAMUSCULAR | Status: AC
  Filled 2017-05-29: qty 2

## 2017-05-29 MED ORDER — MIDAZOLAM HCL 2 MG/2ML IJ SOLN
1.0000 mg | INTRAMUSCULAR | Status: DC | PRN
  Administered 2017-05-29: 1 mg via INTRAVENOUS

## 2017-05-29 MED ORDER — ATROPINE SULFATE 1 MG/ML IJ SOLN
0.5000 mg | Freq: Once | INTRAMUSCULAR | Status: AC
  Administered 2017-05-29: 1 mg via INTRAVENOUS

## 2017-05-29 MED ORDER — SODIUM CHLORIDE 0.9 % IV SOLN
INTRAVENOUS | Status: DC
  Administered 2017-05-29 (×2): via INTRAVENOUS

## 2017-05-29 MED ORDER — FENTANYL CITRATE (PF) 100 MCG/2ML IJ SOLN
50.0000 ug | INTRAMUSCULAR | Status: DC | PRN
  Administered 2017-05-29: 50 ug via INTRAVENOUS

## 2017-05-29 MED ORDER — FENTANYL CITRATE (PF) 100 MCG/2ML IJ SOLN
50.0000 ug | INTRAMUSCULAR | Status: DC | PRN
  Filled 2017-05-29: qty 2

## 2017-05-29 NOTE — Procedures (Signed)
Central Venous Catheter Insertion Procedure Note [name] [name] [id number] 11/23/1933  Procedure: Insertion of Central Venous Catheter Indications: Assessment of intravascular volume, Drug and/or fluid administration and Frequent blood sampling  Procedure Details Consent: Risks of procedure as well as the alternatives and risks of each were explained to the (patient/caregiver).  Consent for procedure obtained. Time Out: Verified patient identification, verified procedure, site/side was marked, verified correct patient position, special equipment/implants available, medications/allergies/relevent history reviewed, required imaging and test results available.  Performed  Maximum sterile technique was used including antiseptics, cap, gloves, gown, hand hygiene, mask and sheet. Skin prep: Chlorhexidine; local anesthetic administered A antimicrobial bonded/coated triple lumen catheter was placed in the right internal jugular vein using the Seldinger technique.  Evaluation Blood flow good Complications: No apparent complications Patient did tolerate procedure well. Chest X-ray ordered to verify placement.  CXR: pending.  Hayden Pedro, AGACNP-BC [facility]  Pgr: [phone]  PCCM Pgr: [phone]

## 2017-05-29 NOTE — ED Notes (Signed)
CT 3

## 2017-05-29 NOTE — Progress Notes (Signed)
RT attempted radial arterial line x2 per order from Dr. Lamonte Sakai and Hayden Pedro, NP.  RT was unsuccessful.  Louretta Parma, NP aware.

## 2017-05-29 NOTE — Progress Notes (Signed)
ABG results called to Kathyrn Drown, RN at [facility].

## 2017-05-29 NOTE — ED Notes (Signed)
Ambrose Pancoast, RN 2H plan for patient prior to floor arrival

## 2017-05-29 NOTE — ED Notes (Signed)
Report attempted 

## 2017-05-29 NOTE — Code Documentation (Signed)
Pt has now been intubated

## 2017-05-29 NOTE — Progress Notes (Signed)
[place] Progress Note Patient Name: [name] [name] DOB: 28-Jan-1933 MRN: [id number]   Date of Service  05/23/2017  HPI/Events of Note  Notified by bedside nurse of family's desire to change CODE STATUS to no CPR. Awaiting additional family member arrival from [facility]. Continuing support.   eICU Interventions  CODE STATUS changed to Limited code.      Intervention Category Major Interventions: End of life / care limitation discussion  Tera Partridge 05/22/2017, 9:20 PM

## 2017-05-29 NOTE — Progress Notes (Signed)
ANTICOAGULATION CONSULT NOTE - Follow-up Consult  Pharmacy Consult for Heparin (while Eliquis on hold) Indication: atrial fibrillation  Allergies  Allergen Reactions  . Isosorbide Dinitrate Other (See Comments)    Other reaction(s): severe headaches  . Prednisone Other (See Comments)    Heart palpitations  . Amoxicillin Swelling    Lip/throat swelling  . Sudafed [Pseudoephedrine] Other (See Comments)    Other reaction(s): rash  . Aspirin Other (See Comments)    Baby aspirin is tolerated, itching in the past  . Amoxicillin-Pot Clavulanate Swelling and Other (See Comments)    Other reaction(s): Facial and tounge swelling  . Cephalexin Other (See Comments)  . Percocet [Oxycodone-Acetaminophen] Other (See Comments)  . Sulfa Antibiotics Other (See Comments)  . Sulfonamide Derivatives Other (See Comments)    Unknown reaction a long time ago    Patient Measurements: Height: 5\' 5"  (165.1 cm) Weight: 135 lb (61.2 kg) IBW/kg (Calculated) : 57 Heparin Dosing Weight: 61 kg  Vital Signs: BP: 98/62 (05/31 2300) Pulse Rate: 79 (05/31 1915)  Labs:  Recent Labs  05/07/2017 0202 05/07/2017 0657 05/28/2017 1336 05/18/2017 1800 05/16/2017 1830 05/14/2017 2000  HGB 10.4*  --   --   --   --   --   HCT 34.4*  --   --   --   --   --   PLT 279  --   --   --   --   --   APTT  --  39*  --   --   --  >200*  HEPARINUNFRC  --  >2.20*  --   --   --   --   CREATININE 1.93*  --  2.47* 2.85*  --   --   TROPONINI  --   --  <0.03  --  <0.03  --     Estimated Creatinine Clearance: 13.7 mL/min (A) (by C-G formula based on SCr of 2.85 mg/dL (H)).  Assessment: 83 y.o. F presents with weakness and dyspnea. Pt on Eliquis PTA for afib. Last dose taken 5/30 2100. Eliquis on hold with acute renal dysfunction (SCr up to 2.85, baseline ~1.2). Eliquis affecting heparin levels (>2.2) so will utilize PTT for heparin monitoring until PTT and heparin level correlate.  PTT >200 sec (supratherapeutic) on gtt at 900  units/hr. Spoke with RN who drew lab from arm opposite where heparin running. No bleeding noted.  Goal of Therapy:  Heparin level 0.3-0.7 units/ml; PTT 66-102 sec Monitor platelets by anticoagulation protocol: Yes   Plan:  Hold heparin x 1 hour Decrease heparin gtt to 700 units/hr Will f/u 8hr PTT and heparin level  Sherlon Handing, PharmD, BCPS Clinical pharmacist, pager [phone] 05/13/2017,11:32 PM

## 2017-05-29 NOTE — ED Notes (Signed)
Central line okay to use per Louretta Parma, NP.  Levophed new bag switched to central line.

## 2017-05-29 NOTE — Progress Notes (Signed)
Called to see pt re: ongoing bradycardia  Pt w/ recent hx DOE and orthostatic dizzyness. Came to ER this am and was bradycardic w/ HR 30s-40s. Rhythm afib.  Trop neg. Echo ordered, quick look at bedside by Dr Acie Fredrickson showed near-nl EF, RV functioning well, no peric effusion, no evidence of R heart strain.  Pt has deteriorated during the day, is now intubated and HR in the 30s at times despite 20 mcg/kg/min of dopamine.  She has been hypotensive and her MAP is generally 55 or less despite norepi at 20 mcg/min in addition to the dopa.  Her lactic acid levels and BUN/Cr are increasing. No cause for this on CXR, UA, UDS or exam. CT chest/abd/pelvis pending.  Dr Acie Fredrickson in to see the pt and recommended a trial of external pacing, this helped her HR.    Lenoard Aden 05/08/2017 5:25 PM Beeper [phone]  Attending Note:   The patient was seen and examined.  Agree with assessment and plan as noted above.  Changes made to the above note as needed.  Patient seen and independently examined with Rosaria Ferries, PA.   We discussed all aspects of the encounter. I agree with the assessment and plan as stated above.  1. Persistent bradycardia. Pt's prognosis is poor She is very acidoticl. We are able to pace her externally but not at a HR of above 50-60 The pacer spikes are going through but she has a long refractory period during which the pacing does not capture, I've discussed this with Dr. Irish Lack who thinks that  Placing a temporary pacing wire would not benefit more than what we are able to accomplish with the transcutaneous pacer.    Her effective HR is 50-60 and she has good LV function by bedside echo.   I think that her cardiac output should be adequate and does not appear to the cause of her profound acidosis.      I have spent a total of 70 minutes with patient reviewing hospital  notes , telemetry, EKGs, labs and examining patient as well as establishing an assessment and  plan that was discussed with the patient. > 50% of time was spent in direct patient care.    Thayer Headings, Brooke Bonito., MD, [facility] 05/04/2017, 5:57 PM [address]  [place] Pager [phone]

## 2017-05-29 NOTE — ED Notes (Signed)
Dr. Maryland Pink, admitting, at bedside.

## 2017-05-29 NOTE — ED Notes (Signed)
Critical care NP at bedside.  

## 2017-05-29 NOTE — Progress Notes (Signed)
[facility] Progress Note Patient Name: [name] [name] DOB: October 08, 1933 MRN: [id number]   Date of Service  05/02/2017  HPI/Events of Note  Notified of ABG post intubation. Worsening acidosis which seems to be predominantly metabolic with elevated lactic acid. Ventilator optimized with PCO2 17.9.   eICU Interventions  1. Starting bicarbonate drip 2. Continuing to trend lactic acid 3. Repeat ABG in 2 hours      Intervention Category Major Interventions: Acid-Base disturbance - evaluation and management  Tera Partridge 05/17/2017, 5:44 PM

## 2017-05-29 NOTE — Progress Notes (Signed)
ANTICOAGULATION CONSULT NOTE - Initial Consult  Pharmacy Consult for Heparin (while Eliquis on hold) Indication: atrial fibrillation  Allergies  Allergen Reactions  . Isosorbide Dinitrate Other (See Comments)    Other reaction(s): severe headaches  . Prednisone Other (See Comments)    Heart palpitations  . Amoxicillin Swelling    Lip/throat swelling  . Sudafed [Pseudoephedrine] Other (See Comments)    Other reaction(s): rash  . Aspirin Other (See Comments)    Baby aspirin is tolerated, itching in the past  . Amoxicillin-Pot Clavulanate Swelling and Other (See Comments)    Other reaction(s): Facial and tounge swelling  . Cephalexin Other (See Comments)  . Percocet [Oxycodone-Acetaminophen] Other (See Comments)  . Sulfa Antibiotics Other (See Comments)  . Sulfonamide Derivatives Other (See Comments)    Unknown reaction a long time ago    Patient Measurements: Height: 5\' 5"  (165.1 cm) Weight: 135 lb (61.2 kg) IBW/kg (Calculated) : 57 Heparin Dosing Weight: 61 kg  Vital Signs: BP: 112/62 (05/31 0430) Pulse Rate: 70 (05/31 0430)  Labs:  Recent Labs  05/14/2017 0202  HGB 10.4*  HCT 34.4*  PLT 279  CREATININE 1.93*    Estimated Creatinine Clearance: 20.2 mL/min (A) (by C-G formula based on SCr of 1.93 mg/dL (H)).   Medical History: Past Medical History:  Diagnosis Date  . Allergic rhinitis, cause unspecified   . Anxiety and depression   . Atrial fibrillation ([place]) 05/08/2017  . Atrial septal aneurysm 05/08/2017  . Avitaminosis D 05/08/2017  . Carotid artery plaque, bilateral 05/08/2017  . Chronic headaches   . Chronic venous insufficiency 04/28/2015  . Constipation 08/29/2014  . Coronary artery disease   . DJD (degenerative joint disease)   . Family history of malignant neoplasm of gastrointestinal tract   . GERD (gastroesophageal reflux disease)   . H/O: hysterectomy   . Hyperlipidemia   . Hypertension   . Hypothyroidism   . Internal hemorrhoids 07/30/2011   Grade 2 hemorrhoids 10/19/14 band ligation right posterior hemorrhoidal bundle 12/07/14 band ligation right anterior hemorrhoidal bundle  02/03/15  band ligation left lateral hemorrhoidal bundle   . Orthostatic hypotension   . Osteoarthrosis, unspecified whether generalized or localized, unspecified site   . Osteoporosis   . Postthrombotic syndrome 06/23/2012   Left Leg   . Rectal bleeding 08/29/2014  . TI (tricuspid incompetence) 05/08/2017  . Unspecified functional disorder of intestine   . Varicose veins of leg with complications 8/93/7342    Medications:  See electronic med rec  Assessment: 83 y.o. F presents with weakness and dyspnea. Pt on Eliquis PTA for afib. Last dose taken 5/30 2100. Eliquis on hold with acute renal dysfunction (SCr up to 1.93, baseline ~1.2). Eliquis will likely be affecting heparin levels so will utilize PTT for heparin monitoring until PTT and heparin level correlate.  Goal of Therapy:  Heparin level 0.3-0.7 units/ml; PTT 66-102 sec Monitor platelets by anticoagulation protocol: Yes   Plan:  Baseline heparin level and PTT At 0900 (12 hrs post last dose of Eliquis), begin heparin 900 units/hr. No bolus. Will f/u 8hr PTT Daily heparin level, PTT, and CBC  Sherlon Handing, PharmD, BCPS Clinical pharmacist, pager [phone] 05/26/2017,4:52 AM

## 2017-05-29 NOTE — ED Notes (Signed)
Dr. Rancour at bedside. 

## 2017-05-29 NOTE — Progress Notes (Addendum)
[facility] Progress Note Patient Name: KYRIANNA BARLETTA DOB: [birth year] MRN: [id number]   Date of Service  05/12/2017  HPI/Events of Note  Patient has deteriorated over the course of the day. Last Lactic Acid = 15.3 - DDx includes: 1. Bowel Ischemia (unable to do contrasted Abdominal CT Scan d/t poor renal function) vs 2. Poor LV function (less likely d/t bedside echo by cardiology which  Reveals near normal EF. Troponin < 0.03 argues against acute MI as well.) vs 3. Severe Liver Dysfunction (doubt on the basis of Bilirubin = 2.5 and AST/ALT = 100-200 range) vs 4. Inadvertent Ca++ Channel OD (bradycardia suggests this, however, would expect global hypokinesia on cardiac echo which was not seen).   eICU Interventions  Will order: 1. CVP monitoring.  2. ABG now. 3. CBC now. 4. COOX when CVP and Hgb acceptable.      Intervention Category Major Interventions: Acid-Base disturbance - evaluation and management;Respiratory failure - evaluation and management  Lysle Dingwall 05/05/2017, 11:55 PM

## 2017-05-29 NOTE — Procedures (Signed)
Intubation Procedure Note [name] [name] [id number] 1933/04/29  Procedure: Intubation Indications: Respiratory insufficiency  Procedure Details Consent: Risks of procedure as well as the alternatives and risks of each were explained to the (patient/caregiver).  Consent for procedure obtained. Time Out: Verified patient identification, verified procedure, site/side was marked, verified correct patient position, special equipment/implants available, medications/allergies/relevent history reviewed, required imaging and test results available.  Performed  Maximum sterile technique was used including cap, gloves and hand hygiene.  3    Evaluation Hemodynamic Status: BP stable throughout; O2 sats: stable throughout Patient's Current Condition: stable Complications: No apparent complications Patient did tolerate procedure well. Chest X-ray ordered to verify placement.  CXR: pending.   Hayden Pedro, AGACNP-BC [facility]  Pgr: [phone]  PCCM Pgr: [phone]

## 2017-05-29 NOTE — Procedures (Signed)
Arterial Catheter Insertion Procedure Note [name] [name] [id number] October 13, 1933  Procedure: Insertion of Arterial Catheter  Indications: Blood pressure monitoring and Frequent blood sampling  Procedure Details Consent: Risks of procedure as well as the alternatives and risks of each were explained to the (patient/caregiver).  Consent for procedure obtained. Time Out: Verified patient identification, verified procedure, site/side was marked, verified correct patient position, special equipment/implants available, medications/allergies/relevent history reviewed, required imaging and test results available.  Performed  Maximum sterile technique was used including antiseptics, cap, gloves, gown, hand hygiene, mask and sheet. Skin prep: Chlorhexidine; local anesthetic administered 22 gauge catheter was inserted into left femoral artery using the Seldinger technique.  Evaluation Blood flow good; BP tracing good. Complications: No apparent complications.   Hayden Pedro, AGACNP-BC [facility]  Pgr: [phone]  PCCM Pgr: [phone]

## 2017-05-29 NOTE — ED Notes (Signed)
CT advised wait 30 minutes after contrast is given before CT to be performed

## 2017-05-29 NOTE — Progress Notes (Signed)
[facility] Progress Note Patient Name: LIANNY MOLTER DOB: Nov 18, 1933 MRN: [id number]   Date of Service  05/15/2017  HPI/Events of Note  Lengthy discussion with the patient's sister at bedside via camera. Explained that the patient's degree of shock is continuing to worsen as is her acidosis and multisystem organ failure. Prognosis is dismal with dear 0% chance of recovery. Discussed limiting resuscitative measures versus transitioning to full comfort care. Sister is struggling with discussing the plan of care with patient's husband who has dementia of unclear severity. Family request more time to discuss their wishes.   eICU Interventions  Requested nurse contact hospital chaplain to provide family with spiritual support.      Intervention Category Major Interventions: End of life / care limitation discussion  Tera Partridge 05/15/2017, 9:05 PM

## 2017-05-29 NOTE — ED Provider Notes (Signed)
[facility] DEPT Provider Note   CSN: [id number] Arrival date & time: 05/11/2017  0103     History   Chief Complaint Chief Complaint  Patient presents with  . Shortness of Breath  . Weakness    HPI [name] [name] is a 83 y.o. female.  [name] [name] is a 83 y.o. Female with a history of coronary artery disease, hypertension, atrial fibrillation, DVT and on Eliquis who presents to the emergency department complaining of shortness of breath as well as dyspnea on exertion that has worsened over the past week. Patient reports over the past week she's developed shortness of breath that is worsened with exertion. She reports today she can hardly walk but a few steps without feeling very short of breath and feeling that she "might die." She was currently she feels short of breath even at rest. She denies any chest pain currently. She reports having some chest pain about 2-3 days ago. She reports she's been compliant with her medications including Eliquis. She sees cardiologist Dr. Harrington Challenger. Last echo showed an EF of about 60% in April of this year. Patient does not use oxygen at home. EMS reports her oxygen saturation was 88% on room air on their arrival which improved with oxygen via nonrebreather. She reports bilateral lower extremity edema which is mild and not much worse than usual. She does report some increased ankle edema. She denies fevers, coughing, nausea, vomiting, abdominal pain, urinary symptoms, rashes, syncope or lightheadedness.   The history is provided by the patient and medical records. No language interpreter was used.  Shortness of Breath  Associated symptoms include chest pain (resolved. ) and leg swelling. Pertinent negatives include no fever, no headaches, no sore throat, no neck pain, no cough, no wheezing, no vomiting, no abdominal pain and no rash.  Weakness  Associated symptoms include shortness of breath and chest pain (resolved. ). Pertinent negatives include no vomiting  and no headaches.    Past Medical History:  Diagnosis Date  . Allergic rhinitis, cause unspecified   . Anxiety and depression   . Atrial fibrillation ([place]) 05/08/2017  . Atrial septal aneurysm 05/08/2017  . Avitaminosis D 05/08/2017  . Carotid artery plaque, bilateral 05/08/2017  . Chronic headaches   . Chronic venous insufficiency 04/28/2015  . Constipation 08/29/2014  . Coronary artery disease   . DJD (degenerative joint disease)   . Family history of malignant neoplasm of gastrointestinal tract   . GERD (gastroesophageal reflux disease)   . H/O: hysterectomy   . Hyperlipidemia   . Hypertension   . Hypothyroidism   . Internal hemorrhoids 07/30/2011   Grade 2 hemorrhoids 10/19/14 band ligation right posterior hemorrhoidal bundle 12/07/14 band ligation right anterior hemorrhoidal bundle  02/03/15  band ligation left lateral hemorrhoidal bundle   . Orthostatic hypotension   . Osteoarthrosis, unspecified whether generalized or localized, unspecified site   . Osteoporosis   . Postthrombotic syndrome 06/23/2012   Left Leg   . Rectal bleeding 08/29/2014  . TI (tricuspid incompetence) 05/08/2017  . Unspecified functional disorder of intestine   . Varicose veins of leg with complications 06/12/4314    Patient Active Problem List   Diagnosis Date Noted  . AKI (acute kidney injury) ([place]) 05/18/2017  . Symptomatic bradycardia 05/15/2017  . Atrial fibrillation with slow ventricular response ([place]) 05/23/2017  . Carotid artery plaque, bilateral 05/08/2017  . Atrial fibrillation ([place]) 05/08/2017  . Atrial septal aneurysm 05/08/2017  . History of DVT (deep vein thrombosis) 05/08/2017  . Artificial  knee joint present 05/08/2017  . TI (tricuspid incompetence) 05/08/2017  . Avitaminosis D 05/08/2017  . Osteoporosis   . GERD (gastroesophageal reflux disease)   . Anxiety and depression   . Hypothyroidism   . Piriformis syndrome, right 03/12/2017  . Lumbar radiculopathy 02/21/2016  . Greater  trochanteric bursitis of right hip 01/26/2016  . Degenerative arthritis of left knee 01/26/2016  . Neck pain 08/15/2015  . Varicose veins of leg with complications 66/44/0347  . Chronic venous insufficiency 04/28/2015  . Postthrombotic syndrome 06/23/2012  . Internal hemorrhoids 07/30/2011  . Arteriosclerosis of coronary artery 03/15/2010  . Hyperlipemia 04/08/2009  . HYPOTENSION, ORTHOSTATIC 04/08/2009  . Essential hypertension 08/12/2007  . Allergic rhinitis 08/12/2007  . Osteoarthritis 08/12/2007    Past Surgical History:  Procedure Laterality Date  . ABDOMINAL HYSTERECTOMY    . APPENDECTOMY    . CARDIAC CATHETERIZATION    . CATARACT EXTRACTION W/ INTRAOCULAR LENS IMPLANT Bilateral   . CERVICAL SPINE SURGERY    . cyst removal on back    . GANGLION CYST EXCISION Bilateral   . KNEE SURGERY Left   . NASAL SINUS SURGERY    . TONSILLECTOMY    . TOTAL KNEE ARTHROPLASTY Right     OB History    No data available       Home Medications    Prior to Admission medications   Medication Sig Start Date End Date Taking? Authorizing Provider  acetaminophen (TYLENOL) 500 MG tablet Take 500 mg by mouth every 6 (six) hours as needed for moderate pain.    Yes [provider]  atorvastatin (LIPITOR) 10 MG tablet Take 10 mg by mouth daily.   Yes [provider]  ELIQUIS 5 MG TABS tablet TAKE 1 TABLET BY MOUTH TWICE A DAY 02/10/17  Yes Fay Records, MD  furosemide (LASIX) 40 MG tablet Take 1 tablet (40 mg total) by mouth daily. 05/22/17 08/20/17 Yes Fay Records, MD  levothyroxine (SYNTHROID, LEVOTHROID) 112 MCG tablet Take 112 mcg by mouth daily before breakfast.   Yes [provider]  potassium chloride (K-DUR) 10 MEQ tablet Take 1 tablet (10 mEq total) by mouth daily. 05/22/17 08/20/17 Yes Fay Records, MD  valsartan (DIOVAN) 80 MG tablet Take 80 mg by mouth 2 (two) times daily.   Yes [provider]  verapamil (CALAN-SR) 180 MG CR tablet Take 180 mg by  mouth at bedtime.   Yes [provider]    Family History Family History  Problem Relation Age of Onset  . Colon cancer Mother   . Cancer Mother   . Varicose Veins Mother   . Heart attack Father 67  . Heart disease Father   . Brain cancer Other        1st degree relative  . Heart disease Maternal Uncle        x 4  . Heart disease Maternal Aunt        x 1  . Pancreatic cancer Neg Hx   . Prostate cancer Neg Hx   . Stomach cancer Neg Hx        pt notes that mother says she had "part of her stomach removed"  . Liver disease Neg Hx   . Kidney disease Neg Hx     Social History Social History  Substance Use Topics  . Smoking status: Never Smoker  . Smokeless tobacco: Never Used     Comment: was a passive smoker for 15 yrs  . Alcohol use No  Allergies   Isosorbide dinitrate; Prednisone; Amoxicillin; Sudafed [pseudoephedrine]; Aspirin; Amoxicillin-pot clavulanate; Cephalexin; Percocet [oxycodone-acetaminophen]; Sulfa antibiotics; and Sulfonamide derivatives   Review of Systems Review of Systems  Constitutional: Positive for fatigue. Negative for chills and fever.  HENT: Negative for congestion and sore throat.   Eyes: Negative for visual disturbance.  Respiratory: Positive for shortness of breath. Negative for cough and wheezing.   Cardiovascular: Positive for chest pain (resolved. ) and leg swelling. Negative for palpitations.  Gastrointestinal: Negative for abdominal pain, diarrhea, nausea and vomiting.  Genitourinary: Negative for dysuria.  Musculoskeletal: Negative for back pain and neck pain.  Skin: Negative for rash.  Neurological: Negative for headaches.     Physical Exam Updated Vital Signs BP 96/61   Pulse (!) 44   Resp 19   Ht 5\' 5"  (1.651 m)   Wt 61.2 kg (135 lb)   SpO2 93%   BMI 22.47 kg/m   Physical Exam  Constitutional: She is oriented to person, place, and time. She appears well-developed and well-nourished. No distress.  Nontoxic  appearing.  HENT:  Head: Normocephalic and atraumatic.  Mouth/Throat: Oropharynx is clear and moist.  Eyes: Conjunctivae are normal. Pupils are equal, round, and reactive to light. Right eye exhibits no discharge. Left eye exhibits no discharge.  Neck: Neck supple. No JVD present.  Cardiovascular: Normal heart sounds and intact distal pulses.  Exam reveals no gallop and no friction rub.   Irregular irregular rhythm. Bradycardia in the 40s-50s.   Pulmonary/Chest: Effort normal. No stridor. No respiratory distress. She has no wheezes. She has no rales.  Diminished lung sounds bilaterally. Some slight increased work of breathing. Respirations of 24. Patient speaking in complete sentences.  Abdominal: Soft. There is no tenderness. There is no guarding.  Musculoskeletal: She exhibits edema. She exhibits no tenderness.  Mild bilateral lower extremity edema without tenderness. No pitting edema.  Lymphadenopathy:    She has no cervical adenopathy.  Neurological: She is alert and oriented to person, place, and time. Coordination normal.  Skin: Skin is warm and dry. Capillary refill takes less than 2 seconds. No rash noted. She is not diaphoretic. No erythema. No pallor.  Small old abrasion noted to her left lower leg. No bleeding or signs of infection   Psychiatric: She has a normal mood and affect. Her behavior is normal.  Nursing note and vitals reviewed.    ED Treatments / Results  Labs (all labs ordered are listed, but only abnormal results are displayed) Labs Reviewed  CBC WITH DIFFERENTIAL/PLATELET - Abnormal; Notable for the following:       Result Value   Hemoglobin 10.4 (*)    HCT 34.4 (*)    MCH 25.5 (*)    RDW 16.5 (*)    All other components within normal limits  BASIC METABOLIC PANEL - Abnormal; Notable for the following:    Sodium 133 (*)    CO2 17 (*)    Glucose, Bld 122 (*)    BUN 34 (*)    Creatinine, Ser 1.93 (*)    GFR calc non Af Amer 23 (*)    GFR calc Af Amer 27  (*)    All other components within normal limits  BRAIN NATRIURETIC PEPTIDE - Abnormal; Notable for the following:    B Natriuretic Peptide 373.1 (*)    All other components within normal limits  HEPARIN LEVEL (UNFRACTIONATED) - Abnormal; Notable for the following:    Heparin Unfractionated >2.20 (*)    All other components within  normal limits  APTT - Abnormal; Notable for the following:    aPTT 39 (*)    All other components within normal limits  I-STAT CG4 LACTIC ACID, ED - Abnormal; Notable for the following:    Lactic Acid, Venous 2.81 (*)    All other components within normal limits  CBG MONITORING, ED - Abnormal; Notable for the following:    Glucose-Capillary 103 (*)    All other components within normal limits  URINALYSIS, ROUTINE W REFLEX MICROSCOPIC  APTT  LACTIC ACID, PLASMA  I-STAT TROPOININ, ED  I-STAT ARTERIAL BLOOD GAS, ED    EKG  EKG Interpretation  Date/Time:  Thursday May 29 2017 02:01:41 EDT Ventricular Rate:  48 PR Interval:    QRS Duration: 101 QT Interval:  509 QTC Calculation: 455 R Axis:   87 Text Interpretation:  Bradycardia with irregular rate Prolonged PR interval Anteroseptal infarct, age indeterminate Artifact in lead(s) I II aVR aVL aVF V1 slow atrial fibrillation? Confirmed by Ezequiel Essex [phone]) on 05/02/2017 2:35:37 AM       Radiology Dg Chest 2 View  Result Date: 05/27/2017 CLINICAL DATA:  Acute onset of worsening generalized weakness and difficulty breathing. Exertional dyspnea. Initial encounter. EXAM: CHEST  2 VIEW COMPARISON:  Chest radiograph performed 03/14/2017, and CTA of the chest performed 05/19/2017 FINDINGS: The lungs are well-aerated and clear. There is no evidence of focal opacification, pleural effusion or pneumothorax. The heart is enlarged. No acute osseous abnormalities are seen. Scattered calcification is noted along the proximal abdominal aorta. IMPRESSION: 1. No acute cardiopulmonary process seen. 2. Cardiomegaly.  3. Scattered aortic atherosclerosis. Electronically Signed   By: Garald Balding M.D.   On: 05/18/2017 02:24    Procedures Procedures (including critical care time)  CRITICAL CARE Performed by: Hanley Hays   Total critical care time: 50 minutes  Critical care time was exclusive of separately billable procedures and treating other patients.  Critical care was necessary to treat or prevent imminent or life-threatening deterioration.  Critical care was time spent personally by me on the following activities: development of treatment plan with patient and/or surrogate as well as nursing, discussions with consultants, evaluation of patient's response to treatment, examination of patient, obtaining history from patient or surrogate, ordering and performing treatments and interventions, ordering and review of laboratory studies, ordering and review of radiographic studies, pulse oximetry and re-evaluation of patient's condition.   Medications Ordered in ED Medications  levothyroxine (SYNTHROID, LEVOTHROID) tablet 112 mcg (not administered)  atorvastatin (LIPITOR) tablet 10 mg (not administered)  acetaminophen (TYLENOL) tablet 500 mg (not administered)  sodium chloride flush (NS) 0.9 % injection 3 mL (not administered)  0.9 %  sodium chloride infusion ( Intravenous New Bag/Given 05/12/2017 0658)  heparin ADULT infusion 100 units/mL (25000 units/21mL sodium chloride 0.45%) (not administered)  sodium chloride 0.9 % bolus 500 mL (0 mLs Intravenous Stopped 05/07/2017 0700)  ondansetron (ZOFRAN) injection 4 mg (4 mg Intravenous Given 05/24/2017 0500)  atropine injection 0.5 mg (1 mg Intravenous Given 05/20/2017 0511)  calcium gluconate 1 g in sodium chloride 0.9 % 100 mL IVPB (0 g Intravenous Stopped 04/29/2017 0701)     Initial Impression / Assessment and Plan / ED Course  I have reviewed the triage vital signs and the nursing notes.  Pertinent labs & imaging results that were available during my  care of the patient were reviewed by me and considered in my medical decision making (see chart for details).    This  is a 83 y.o.  Female with a history of coronary artery disease, hypertension, atrial fibrillation, DVT and on Eliquis who presents to the emergency department complaining of shortness of breath as well as dyspnea on exertion that has worsened over the past week. Patient reports over the past week she's developed shortness of breath that is worsened with exertion. She reports today she can hardly walk but a few steps without feeling very short of breath and feeling that she "might die." She was currently she feels short of breath even at rest. She denies any chest pain currently. She reports having some chest pain about 2-3 days ago. She reports she's been compliant with her medications including Eliquis. She sees cardiologist Dr. Harrington Challenger. Last echo showed an EF of about 60% in April of this year. She had a CT angiogram of her chest about 10 days ago that showed no evidence of PE. Patient does not use oxygen at home. EMS reports her oxygen saturation was 88% on room air on their arrival which improved with oxygen via nonrebreather.  On exam patient is afebrile nontoxic appearing. She is some slight increased work of breathing. She has diminished lung sounds to her bilateral bases, no wheezes, rales or rhonchi.  No significant lower extremity edema. She is bradycardic and in A. fib on the monitor. Heart rate between 40s and 50s initially. She is mentating well. Good blood pressure. Suspect patient's symptoms are related to her bradycardia. Troponin is not elevated. CBC shows some anemia that is around her baseline. BMP reveals an acute kidney injury with a creatinine of 1.93. BNP is only mildly elevated at 373. I have low suspicion for COPD, or CHF. I consulted with cardiology fellow Dr. Koleen Nimrod who suspects her symptoms may be related to verapamil. He thinks due to her kidney function she may  have problems with excretion of verapamil and this could lower her heart rate. She has a reversible cause of bradycardia and feels patient can be admitted to medicine. Stop her verapamil. She agrees with plan for admission. At reevaluation patient has bradycardic episodes into the upper 30s to 40s. She reports feeling more dizzy. Pacer pads to bedside. Dr. Wyvonnia Dusky at bedside and patient given an amp of atropine. Patient responded well to this and had heart rates into the 50s. She still has some episodes of hypoxia, however this is often with poor waveform. She is requiring oxygen to maintain her oxygen saturation. She is mentating well and has normal blood pressure. I spoke with hospice doctor Alcario Drought who would like cardiology to see the patient. Dr. Koleen Nimrod is down to see the patient. He advises Dr. Alcario Drought on same plan to hold off on her verapamil. Hospital service plans to admit.  This patient was discussed with and evaluated by Dr. Wyvonnia Dusky who agrees with assessment and plan.   Final Clinical Impressions(s) / ED Diagnoses   Final diagnoses:  Symptomatic bradycardia  Hypoxia  AKI (acute kidney injury) [facility])    New Prescriptions New Prescriptions   No medications on file     Waynetta Pean, Hershal Coria 05/05/2017 2409    Ezequiel Essex, MD 05/05/2017 [phone]

## 2017-05-29 NOTE — Progress Notes (Signed)
ABG results given to Hayden Pedro, NP and Dr. Lamonte Sakai.  No orders at this time.  BiPAP not needed at this time per them.

## 2017-05-29 NOTE — ED Notes (Signed)
Dopamine replacement bag requested

## 2017-05-29 NOTE — H&P (Addendum)
History and Physical    [name] [name] WPY:[id number] DOB: 1933-11-23 DOA: 05/08/2017  PCP: Briscoe Deutscher, DO  Patient coming from: Home  I have personally briefly reviewed patient's old medical records in [place]  Chief Complaint: Weakness, SOB  HPI: [name] [name] is a 83 y.o. female with medical history significant of A.Fib, CAD, DVT, on eliquis.  Patient presents to the ED with c/o SOB, DOE, that has worsened over the past 1 week.  Today she is so generally weak that she can hardly walk more than a few steps.  Reports having some CP about 2-3 days ago but is CP free currently.  She does endorse nausea.  EMS called and patient noted to be satting 88% on RA.  ED Course: In the ED patient noted to have rather slow HR in the low 40s and even upper 30s at times.  EKG appears to be A.fib with slow ventricular response.  Her creat today is 1.9 up from 1.2 baseline just 10 days ago.  She was given 1mg  atropine with improvement of HR to the 70s before it has trended back down but is now in the upper 40s lower 50s.   Review of Systems: As per HPI otherwise 10 point review of systems negative.   Past Medical History:  Diagnosis Date  . Allergic rhinitis, cause unspecified   . Anxiety and depression   . Atrial fibrillation ([place]) 05/08/2017  . Atrial septal aneurysm 05/08/2017  . Avitaminosis D 05/08/2017  . Carotid artery plaque, bilateral 05/08/2017  . Chronic headaches   . Chronic venous insufficiency 04/28/2015  . Constipation 08/29/2014  . Coronary artery disease   . DJD (degenerative joint disease)   . Family history of malignant neoplasm of gastrointestinal tract   . GERD (gastroesophageal reflux disease)   . H/O: hysterectomy   . Hyperlipidemia   . Hypertension   . Hypothyroidism   . Internal hemorrhoids 07/30/2011   Grade 2 hemorrhoids 10/19/14 band ligation right posterior hemorrhoidal bundle 12/07/14 band ligation right anterior hemorrhoidal bundle  02/03/15  band ligation left  lateral hemorrhoidal bundle   . Orthostatic hypotension   . Osteoarthrosis, unspecified whether generalized or localized, unspecified site   . Osteoporosis   . Postthrombotic syndrome 06/23/2012   Left Leg   . Rectal bleeding 08/29/2014  . TI (tricuspid incompetence) 05/08/2017  . Unspecified functional disorder of intestine   . Varicose veins of leg with complications 0/53/9767    Past Surgical History:  Procedure Laterality Date  . ABDOMINAL HYSTERECTOMY    . APPENDECTOMY    . CARDIAC CATHETERIZATION    . CATARACT EXTRACTION W/ INTRAOCULAR LENS IMPLANT Bilateral   . CERVICAL SPINE SURGERY    . cyst removal on back    . GANGLION CYST EXCISION Bilateral   . KNEE SURGERY Left   . NASAL SINUS SURGERY    . TONSILLECTOMY    . TOTAL KNEE ARTHROPLASTY Right      reports that she has never smoked. She has never used smokeless tobacco. She reports that she does not drink alcohol or use drugs.  Allergies  Allergen Reactions  . Isosorbide Dinitrate Other (See Comments)    Other reaction(s): severe headaches  . Prednisone Other (See Comments)    Heart palpitations  . Amoxicillin Swelling    Lip/throat swelling  . Sudafed [Pseudoephedrine] Other (See Comments)    Other reaction(s): rash  . Aspirin Other (See Comments)    Baby aspirin is tolerated, itching in the past  .  Amoxicillin-Pot Clavulanate Swelling and Other (See Comments)    Other reaction(s): Facial and tounge swelling  . Cephalexin Other (See Comments)  . Percocet [Oxycodone-Acetaminophen] Other (See Comments)  . Sulfa Antibiotics Other (See Comments)  . Sulfonamide Derivatives Other (See Comments)    Unknown reaction a long time ago    Family History  Problem Relation Age of Onset  . Colon cancer Mother   . Cancer Mother   . Varicose Veins Mother   . Heart attack Father 10  . Heart disease Father   . Brain cancer Other        1st degree relative  . Heart disease Maternal Uncle        x 4  . Heart disease  Maternal Aunt        x 1  . Pancreatic cancer Neg Hx   . Prostate cancer Neg Hx   . Stomach cancer Neg Hx        pt notes that mother says she had "part of her stomach removed"  . Liver disease Neg Hx   . Kidney disease Neg Hx      Prior to Admission medications   Medication Sig Start Date End Date Taking? Authorizing Provider  acetaminophen (TYLENOL) 500 MG tablet Take 500 mg by mouth every 6 (six) hours as needed for moderate pain.    Yes [provider]  atorvastatin (LIPITOR) 10 MG tablet Take 10 mg by mouth daily.   Yes [provider]  ELIQUIS 5 MG TABS tablet TAKE 1 TABLET BY MOUTH TWICE A DAY 02/10/17  Yes Fay Records, MD  furosemide (LASIX) 40 MG tablet Take 1 tablet (40 mg total) by mouth daily. 05/22/17 08/20/17 Yes Fay Records, MD  levothyroxine (SYNTHROID, LEVOTHROID) 112 MCG tablet Take 112 mcg by mouth daily before breakfast.   Yes [provider]  potassium chloride (K-DUR) 10 MEQ tablet Take 1 tablet (10 mEq total) by mouth daily. 05/22/17 08/20/17 Yes Fay Records, MD  valsartan (DIOVAN) 80 MG tablet Take 80 mg by mouth 2 (two) times daily.   Yes [provider]  verapamil (CALAN-SR) 180 MG CR tablet Take 180 mg by mouth at bedtime.   Yes [provider]    Physical Exam: Vitals:   04/29/2017 0330 05/11/2017 0400 05/05/2017 0430 05/24/2017 0500  BP: 111/62 107/63 112/62 (!) 96/48  Pulse: (!) 41 (!) 48 70 (!) 33  Resp: 20 20 19  (!) 29  SpO2: (!) 86% (!) 89% 90% (!) 88%  Weight:      Height:        Constitutional: NAD, calm, comfortable Eyes: PERRL, lids and conjunctivae normal ENMT: Mucous membranes are moist. Posterior pharynx clear of any exudate or lesions.Normal dentition.  Neck: normal, supple, no masses, no thyromegaly Respiratory: clear to auscultation bilaterally, no wheezing, no crackles. Normal respiratory effort. No accessory muscle use.  Cardiovascular: Bradycardic, irregular. Abdomen: no tenderness, no masses  palpated. No hepatosplenomegaly. Bowel sounds positive.  Musculoskeletal: no clubbing / cyanosis. No joint deformity upper and lower extremities. Good ROM, no contractures. Normal muscle tone.  Skin: no rashes, lesions, ulcers. No induration Neurologic: CN 2-12 grossly intact. Sensation intact, DTR normal. Strength 5/5 in all 4.  Psychiatric: Normal judgment and insight. Alert and oriented x 3. Normal mood.    Labs on Admission: I have personally reviewed following labs and imaging studies  CBC:  Recent Labs Lab 05/10/2017 0202  WBC 8.7  NEUTROABS 6.4  HGB 10.4*  HCT 34.4*  MCV 84.3  PLT 299   Basic Metabolic Panel:  Recent Labs Lab 05/28/2017 0202  NA 133*  K 4.7  CL 101  CO2 17*  GLUCOSE 122*  BUN 34*  CREATININE 1.93*  CALCIUM 9.1   GFR: Estimated Creatinine Clearance: 20.2 mL/min (A) (by C-G formula based on SCr of 1.93 mg/dL (H)). Liver Function Tests: No results for input(s): AST, ALT, ALKPHOS, BILITOT, PROT, ALBUMIN in the last 168 hours. No results for input(s): LIPASE, AMYLASE in the last 168 hours. No results for input(s): AMMONIA in the last 168 hours. Coagulation Profile: No results for input(s): INR, PROTIME in the last 168 hours. Cardiac Enzymes: No results for input(s): CKTOTAL, CKMB, CKMBINDEX, TROPONINI in the last 168 hours. BNP (last 3 results)  Recent Labs  04/07/17 1500 04/15/17 1402 05/19/17 1401  PROBNP 2,688* 2,762* 2,655*   HbA1C: No results for input(s): HGBA1C in the last 72 hours. CBG: No results for input(s): GLUCAP in the last 168 hours. Lipid Profile: No results for input(s): CHOL, HDL, LDLCALC, TRIG, CHOLHDL, LDLDIRECT in the last 72 hours. Thyroid Function Tests: No results for input(s): TSH, T4TOTAL, FREET4, T3FREE, THYROIDAB in the last 72 hours. Anemia Panel: No results for input(s): VITAMINB12, FOLATE, FERRITIN, TIBC, IRON, RETICCTPCT in the last 72 hours. Urine analysis:    Component Value Date/Time   COLORURINE  YELLOW 03/11/2016 2314   APPEARANCEUR CLEAR 03/11/2016 2314   LABSPEC 1.009 03/11/2016 2314   PHURINE 7.0 03/11/2016 2314   GLUCOSEU NEGATIVE 03/11/2016 2314   HGBUR NEGATIVE 03/11/2016 2314   BILIRUBINUR NEGATIVE 03/11/2016 2314   KETONESUR NEGATIVE 03/11/2016 2314   PROTEINUR NEGATIVE 03/11/2016 2314   UROBILINOGEN 1.0 06/29/2012 1756   NITRITE NEGATIVE 03/11/2016 2314   LEUKOCYTESUR NEGATIVE 03/11/2016 2314    Radiological Exams on Admission: Dg Chest 2 View  Result Date: 05/02/2017 CLINICAL DATA:  Acute onset of worsening generalized weakness and difficulty breathing. Exertional dyspnea. Initial encounter. EXAM: CHEST  2 VIEW COMPARISON:  Chest radiograph performed 03/14/2017, and CTA of the chest performed 05/19/2017 FINDINGS: The lungs are well-aerated and clear. There is no evidence of focal opacification, pleural effusion or pneumothorax. The heart is enlarged. No acute osseous abnormalities are seen. Scattered calcification is noted along the proximal abdominal aorta. IMPRESSION: 1. No acute cardiopulmonary process seen. 2. Cardiomegaly. 3. Scattered aortic atherosclerosis. Electronically Signed   By: Garald Balding M.D.   On: 05/05/2017 02:24    EKG: Independently reviewed.  Assessment/Plan Principal Problem:   Symptomatic bradycardia Active Problems:   Essential hypertension   AKI (acute kidney injury) ([facility])   Atrial fibrillation with slow ventricular response ([place])    1. Symptomatic bradycardia - with episodes of bradycardia to the upper 30s. 1. Likely related to CCB 2. Holding CCB and allowing it to wash out of system 3. Cards consulting on patient, see their note. 4. Tele monitor 5. O2 via [place] 6. Continuous pulse ox 2. H/o HTN - hold all BP meds given SBP in 90s, AKI. 3. AKI - 1. suspect secondary to hypoperfusion related to symptomatic bradycardia. 2. Alternatively could be due to volume depletion from poor PO intake over past few days.   3. Will keep on IVF to  see if this helps 4. Repeat BMP in AM 4. Hypothyroidism - 1. Continue synthroid for now 2. Note TSH slightly elevated x10 days ago on labs. 5. A.Fib - 1. Holding rate control meds (patient very bradycardic) 2. Holding eliquis and using heparin gtt instead (given AKI)  DVT  prophylaxis: Heparin gtt (holding eliquis due to AKI) Code Status: Full Family Communication: No family in room Disposition Plan: Home after admit Consults called: Cardiology has seen patient Admission status: Admit to inpatient   Etta Quill DO [facility] Pager [phone]  If 7AM-7PM, please contact day team taking care of patient [email or web address] Password [email or web address]  05/24/2017, 5:51 AM

## 2017-05-29 NOTE — Consult Note (Signed)
Cardiology Consult    Patient ID: [name] [name] MRN: [id number], DOB/AGE: June 03, 1933   Admit date: 05/09/2017 Date of Consult: 05/10/2017  Primary Physician: Briscoe Deutscher, DO Reason for Consult:  Symptomatic bradycardia Primary Cardiologist: Dorris Carnes, MD Requesting Provider: Waynetta Pean, PA-C   History of Present Illness    [name] [name] is an 83 year old female with a significant past medical history of permanent atrial fibrillation now Eliquis, hypertension, and stage II chronic kidney disease is here for shortness of breath and dizziness.  Cardiology was asked to consult on patient for potential symptomatic bradycardia.  [name] [name] says for the past week, she has not been feeling well.  She recently started noticing that every time she got up to move around at home, she would become short of breath and lightheaded.  Her symptoms would eventually go away when she sat and rested.  She has felt at times as if she was going to faint but denies fainting.  [name] [name] also says that her appetite has decreased and she has not been doing much eating or drinking.  Along with her decreased appetite is a feeling as if she is going to vomit.  She feels that she is using the urinating less than she normally does.  She denies any other associated symptoms such as palpitations, chest pain, waking up in the middle of the night due to shortness of breath, or problems with laying flat due to shortness of breath.  She does state some swelling in her legs.  She feels that she has been taking all of her medications as prescribed but is not completely certain.  She does not recall taking more of her verapamil than what is prescribed.  At this time she says that she feels nauseous but denies any other symptoms.  She denies ever fainting before in the past.    Past Medical History   Past Medical History:  Diagnosis Date  . Allergic rhinitis, cause unspecified   . Anxiety and depression   . Atrial  fibrillation ([place]) 05/08/2017  . Atrial septal aneurysm 05/08/2017  . Avitaminosis D 05/08/2017  . Carotid artery plaque, bilateral 05/08/2017  . Chronic headaches   . Chronic venous insufficiency 04/28/2015  . Constipation 08/29/2014  . Coronary artery disease   . DJD (degenerative joint disease)   . Family history of malignant neoplasm of gastrointestinal tract   . GERD (gastroesophageal reflux disease)   . H/O: hysterectomy   . Hyperlipidemia   . Hypertension   . Hypothyroidism   . Internal hemorrhoids 07/30/2011   Grade 2 hemorrhoids 10/19/14 band ligation right posterior hemorrhoidal bundle 12/07/14 band ligation right anterior hemorrhoidal bundle  02/03/15  band ligation left lateral hemorrhoidal bundle   . Orthostatic hypotension   . Osteoarthrosis, unspecified whether generalized or localized, unspecified site   . Osteoporosis   . Postthrombotic syndrome 06/23/2012   Left Leg   . Rectal bleeding 08/29/2014  . TI (tricuspid incompetence) 05/08/2017  . Unspecified functional disorder of intestine   . Varicose veins of leg with complications 1/32/4401    Past Surgical History:  Procedure Laterality Date  . ABDOMINAL HYSTERECTOMY    . APPENDECTOMY    . CARDIAC CATHETERIZATION    . CATARACT EXTRACTION W/ INTRAOCULAR LENS IMPLANT Bilateral   . CERVICAL SPINE SURGERY    . cyst removal on back    . GANGLION CYST EXCISION Bilateral   . KNEE SURGERY Left   . NASAL SINUS SURGERY    .  TONSILLECTOMY    . TOTAL KNEE ARTHROPLASTY Right      Allergies  Allergies  Allergen Reactions  . Isosorbide Dinitrate Other (See Comments)    Other reaction(s): severe headaches  . Prednisone Other (See Comments)    Heart palpitations  . Amoxicillin Swelling    Lip/throat swelling  . Sudafed [Pseudoephedrine] Other (See Comments)    Other reaction(s): rash  . Aspirin Other (See Comments)    Baby aspirin is tolerated, itching in the past  . Amoxicillin-Pot Clavulanate Swelling and Other (See  Comments)    Other reaction(s): Facial and tounge swelling  . Cephalexin Other (See Comments)  . Percocet [Oxycodone-Acetaminophen] Other (See Comments)  . Sulfa Antibiotics Other (See Comments)  . Sulfonamide Derivatives Other (See Comments)    Unknown reaction a long time ago    Inpatient Medications    . atorvastatin  10 mg Oral q1800  . levothyroxine  112 mcg Oral QAC breakfast  . sodium chloride flush  3 mL Intravenous Q12H    Family History    Family History  Problem Relation Age of Onset  . Colon cancer Mother   . Cancer Mother   . Varicose Veins Mother   . Heart attack Father 76  . Heart disease Father   . Brain cancer Other        1st degree relative  . Heart disease Maternal Uncle        x 4  . Heart disease Maternal Aunt        x 1  . Pancreatic cancer Neg Hx   . Prostate cancer Neg Hx   . Stomach cancer Neg Hx        pt notes that mother says she had "part of her stomach removed"  . Liver disease Neg Hx   . Kidney disease Neg Hx     Social History    Social History   Social History  . Marital status: Married    Spouse name: N/A  . Number of children: 0  . Years of education: N/A   Occupational History  . retired Retired   Social History Main Topics  . Smoking status: Never Smoker  . Smokeless tobacco: Never Used     Comment: was a passive smoker for 15 yrs  . Alcohol use No  . Drug use: No  . Sexual activity: Not Currently   Other Topics Concern  . Not on file   Social History Narrative  . No narrative on file     Review of Systems    All other systems reviewed and are otherwise negative except as noted above.  Physical Exam    Blood pressure (!) 96/48, pulse (!) 33, resp. rate (!) 29, height 5\' 5"  (1.651 m), weight 61.2 kg (135 lb), SpO2 (!) 88 %.  General: Pleasant, NAD Psych: Normal affect. Neuro: Alert and oriented X 3. Moves all extremities spontaneously. HEENT: Normal  Neck: Supple without bruits or JVD. Lungs:  Resp  regular and unlabored, CTA. Heart: Irregular-irregular no s3, s4, or murmurs. Abdomen: Soft, non-tender, non-distended, BS + x 4.  Extremities: Trace bilateral lower extremity edema. No clubbing or cyanosis . DP/PT/Radials 2+ and equal bilaterally.  Labs    Troponin [facility] of Care Test)  Recent Labs  05/08/2017 0206  TROPIPOC 0.01   No results for input(s): CKTOTAL, CKMB, TROPONINI in the last 72 hours. Lab Results  Component Value Date   WBC 8.7 05/07/2017   HGB 10.4 (L) 05/05/2017   HCT  34.4 (L) 05/10/2017   MCV 84.3 05/24/2017   PLT 279 05/03/2017    Recent Labs Lab 05/02/2017 0202  NA 133*  K 4.7  CL 101  CO2 17*  BUN 34*  CREATININE 1.93*  CALCIUM 9.1  GLUCOSE 122*   Lab Results  Component Value Date   CHOL 155 03/17/2017   HDL 67 03/17/2017   LDLCALC 73 03/17/2017   TRIG 75 03/17/2017   Lab Results  Component Value Date   DDIMER 2.21 (H) 05/19/2017     Radiology Studies    Dg Chest 2 View  Result Date: 05/20/2017 CLINICAL DATA:  Acute onset of worsening generalized weakness and difficulty breathing. Exertional dyspnea. Initial encounter. EXAM: CHEST  2 VIEW COMPARISON:  Chest radiograph performed 03/14/2017, and CTA of the chest performed 05/19/2017 FINDINGS: The lungs are well-aerated and clear. There is no evidence of focal opacification, pleural effusion or pneumothorax. The heart is enlarged. No acute osseous abnormalities are seen. Scattered calcification is noted along the proximal abdominal aorta. IMPRESSION: 1. No acute cardiopulmonary process seen. 2. Cardiomegaly. 3. Scattered aortic atherosclerosis. Electronically Signed   By: Garald Balding M.D.   On: 05/21/2017 02:24   Ct Angio Chest Pe W Or Wo Contrast  Result Date: 05/19/2017 CLINICAL DATA:  History of DVT with shortness of Breath EXAM: CT ANGIOGRAPHY CHEST WITH CONTRAST TECHNIQUE: Multidetector CT imaging of the chest was performed using the standard protocol during bolus administration of  intravenous contrast. Multiplanar CT image reconstructions and MIPs were obtained to evaluate the vascular anatomy. CONTRAST:  80 mL Isovue 370. COMPARISON:  None. FINDINGS: Cardiovascular: Thoracic aorta shows calcifications without aneurysmal dilatation. Mild cardiomegaly is noted. Pulmonary artery demonstrates a normal branching pattern. No pulmonary emboli are seen. Mild coronary calcifications are noted. Mediastinum/Nodes: Thoracic inlet is within normal limits. No significant hilar or mediastinal adenopathy is noted. Lungs/Pleura: Lungs are clear. No pleural effusion or pneumothorax. Upper Abdomen: Within normal limits. Some reflux into the hepatic veins is seen. Musculoskeletal: Degenerative changes of thoracic spine are noted. Review of the MIP images confirms the above findings. IMPRESSION: No evidence of pulmonary emboli. No acute abnormality noted. These results will be called to the ordering clinician or representative by the Radiologist Assistant, and communication documented in the PACS or zVision Dashboard. Electronically Signed   By: Inez Catalina M.D.   On: 05/19/2017 17:39    EKG & Cardiac Imaging    EKG:  05/05/2017 (05:43):  Atrial fibrillation with slow ventricular rate (heart rate 48 bpm), low-voltage  Echocardiogram: 04/22/17:  - Left ventricle: The cavity size was normal. Wall thickness was   increased in a pattern of mild LVH. Systolic function was normal.   The estimated ejection fraction was in the range of 60% to 65%.   Wall motion was normal; there were no regional wall motion   abnormalities. - Mitral valve: There was mild regurgitation. - Left atrium: The atrium was mildly dilated. - Tricuspid valve: There was moderate regurgitation. - Pulmonary arteries: Systolic pressure was mildly increased. PA   peak pressure: 34 mm Hg (S). - Pericardium, extracardiac: A trivial pericardial effusion was   identified.  Assessment & Plan    # Atrial fibrillation with slow  ventricular rate: Patient with noted permanent atrial fibrillation with rate at this time between 48-60 bpm, with some episodes of a rate as low as 36 bpm.  Unclear at this time if her symptoms of shortness of breath and lightheadedness is related to symptomatic bradycardia versus another  potential etiology in the setting of worsening renal function, nausea, and poor appetite.  However, patient is on verapamil in the setting of worsening renal function, so potential reversible etiology of her bradycardia (prior ECGs showed an average heat rate in the 50's).  Her hypotension is unlikely to be caused by chronotropic incompetence.  Patient is on oral anticoagulation for her atrial fibrillation.  Recent TSH was in the euthyroid range.   - Recommend stopping her verapamil at this time and monitoring in observation. - Consider other etiologies of current symptoms.   - Continue home Eliquis  # Hypotension Patient with potential hypotension of unclear etiology a this time in the setting of taking verapamil at home.  Patient may have signs of shock based on worsening renal function and increased lactic acid.  Potentially iatrogenic etiology due to antihypertensive medications.  Low suspicion for bradycardia to cause this degree of hypotension. - Management per primary team.  # Acute kidney injury Patient with known history of stage II chronic kidney disease.  Renal function substantially worse at this time.   - Management per primary team.     Signed, Jon Billings, MD 05/11/2017, 5:52 AM

## 2017-05-29 NOTE — ED Notes (Signed)
This RN called RT to collect ABG

## 2017-05-29 NOTE — Progress Notes (Addendum)
[facility] PROGRESS NOTE  [name] [name] LOV:[id number] DOB: 29-Apr-1933 DOA: 05/06/2017  PCP: Briscoe Deutscher, DO  Brief History/Interval Summary: 83 year old Caucasian female with a past medical history of atrial fibrillation on anticoagulation, coronary artery disease, history of DVT, presented with complaints of shortness of breath, fatigue and was found to be bradycardic and hypotensive. She was also noted to be hypoxic at times. She was hospitalized for further management.  Reason for Visit: Bradycardia. Possible sepsis  Consultants: Cardiology. Critical care medicine.  Procedures: None yet  Antibiotics: None yet  Subjective/Interval History: Patient is somewhat confused and distracted this morning. Denies any headaches. Has been dizzy at home. Unable to tell me if she has had any loss of consciousness. Some shortness of breath, but denies any chest pain.  ROS: No nausea or vomiting  Objective:  Vital Signs  Vitals:   05/24/2017 0930 05/27/2017 0952 05/23/2017 1000 05/11/2017 1100  BP: (!) 94/44  101/76   Pulse:  (!) 45    Resp: 20 20 18  (!) 23  SpO2:      Weight:      Height:        Intake/Output Summary (Last 24 hours) at 05/03/2017 1149 Last data filed at 05/03/2017 1130  Gross per 24 hour  Intake             1503 ml  Output                0 ml  Net             1503 ml   Filed Weights   05/03/2017 0113  Weight: 61.2 kg (135 lb)    General appearance: alert, appears stated age, distracted and no distress Resp: Diminished air entry at the bases. Mild tachypnea noted. No crackles or wheezing. Cardio: regular rate and rhythm, S1, S2 normal, no murmur, click, rub or gallop GI: soft, non-tender; bowel sounds normal; no masses,  no organomegaly Extremities: extremities normal, atraumatic, no cyanosis or edema Neurologic: Patient is awake. Somewhat distracted and confused. No facial asymmetry. Moving all her extremities. No obvious focal deficits noted.  Lab  Results:  Data Reviewed: I have personally reviewed following labs and imaging studies  CBC:  Recent Labs Lab 05/22/2017 0202  WBC 8.7  NEUTROABS 6.4  HGB 10.4*  HCT 34.4*  MCV 84.3  PLT 884    Basic Metabolic Panel:  Recent Labs Lab 05/09/2017 0202  NA 133*  K 4.7  CL 101  CO2 17*  GLUCOSE 122*  BUN 34*  CREATININE 1.93*  CALCIUM 9.1    GFR: Estimated Creatinine Clearance: 20.2 mL/min (A) (by C-G formula based on SCr of 1.93 mg/dL (H)).   BNP (last 3 results)  Recent Labs  04/07/17 1500 04/15/17 1402 05/19/17 1401  PROBNP 2,688* 2,762* 2,655*    CBG:  Recent Labs Lab 05/23/2017 0719  GLUCAP 103*    Radiology Studies: Dg Chest 2 View  Result Date: 05/16/2017 CLINICAL DATA:  Acute onset of worsening generalized weakness and difficulty breathing. Exertional dyspnea. Initial encounter. EXAM: CHEST  2 VIEW COMPARISON:  Chest radiograph performed 03/14/2017, and CTA of the chest performed 05/19/2017 FINDINGS: The lungs are well-aerated and clear. There is no evidence of focal opacification, pleural effusion or pneumothorax. The heart is enlarged. No acute osseous abnormalities are seen. Scattered calcification is noted along the proximal abdominal aorta. IMPRESSION: 1. No acute cardiopulmonary process seen. 2. Cardiomegaly. 3. Scattered aortic atherosclerosis. Electronically Signed   By: Garald Balding  M.D.   On: 05/24/2017 02:24   Ct Head Wo Contrast  Result Date: 05/08/2017 CLINICAL DATA:  Dizziness EXAM: CT HEAD WITHOUT CONTRAST TECHNIQUE: Contiguous axial images were obtained from the base of the skull through the vertex without intravenous contrast. COMPARISON:  03/11/2016 FINDINGS: Brain: Mild atrophic changes are noted. No findings to suggest acute hemorrhage, acute infarction or space-occupying mass lesion are noted. Vascular: No hyperdense vessel or unexpected calcification. Skull: Normal. Negative for fracture or focal lesion. Sinuses/Orbits: Opacification  of the left maxillary antrum is seen. Prior sinus surgery is noted. Other: None IMPRESSION: Mild atrophic changes. Left maxillary sinus disease. Electronically Signed   By: Inez Catalina M.D.   On: 05/05/2017 11:14   Dg Chest Port 1 View  Result Date: 05/08/2017 CLINICAL DATA:  Hypoxia, confusion, bradycardia EXAM: PORTABLE CHEST 1 VIEW COMPARISON:  05/07/2017 FINDINGS: Stable cardiomegaly. Mild vascular congestion without convincing edema. No air bronchogram, effusion, or pneumothorax. IMPRESSION: Cardiomegaly and vascular congestion. Electronically Signed   By: Monte Fantasia M.D.   On: 05/14/2017 09:53     Medications:  Scheduled: . [START ON 06/01/2017] levothyroxine  56 mcg Intravenous Daily  . sodium chloride flush  3 mL Intravenous Q12H   Continuous: . sodium chloride 100 mL/hr at 05/02/2017 0658  . sodium chloride    . heparin 900 Units/hr (05/08/2017 1124)   EXH:BZJIRC chloride, ondansetron (ZOFRAN) IV  Assessment/Plan:  Principal Problem:   Symptomatic bradycardia Active Problems:   Essential hypertension   AKI (acute kidney injury) (HCC)   Atrial fibrillation with slow ventricular response (HCC)   Bradycardia    Acute respiratory failure with hypoxia. Etiology is unclear. ABG reviewed. Chest x-ray was repeated and does not show any acute findings. She had a CT scan of her chest done recently which did not show any PE. She has been on anticoagulation. She had to be placed on BiPAP. Consult critical care medicine.  Possible sepsis/lactic acidosis. Patient noted to have elevated lactic acid level, which continues to increase. Fluid boluses have been given. Blood pressure has improved some. No obvious source of infection is identified. She has a normal WBC. Chest x-ray does not show any acute findings. UA is pending. Pro-calcitonin pending.  Altered mental status/acute encephalopathy, Possibly due to hypoxia. CT head showed only did not show any evidence for acute intracranial  process. She does not have any focal deficits. Continue to monitor for now.  Bradycardia. Patient has a history of atrial fibrillation and has been on verapamil. Heart rate noted to be in the 30s in the emergency department. She has been seen by cardiology. It is felt that her bradycardia is due to the effects of medication. Hold rate limiting drugs for now.  Hypotension. Could be due to bradycardia, medications or could be due to possible sepsis. Hold blood pressure lowering agents for now.  Acute renal failure. Creatinine noted to be higher than her baseline, which is around 1.2. Continue IV fluids for now. Monitor urine output. Repeat labs in the morning.  History of atrial fibrillation. See above. Holding her rate limiting drugs. Cardiology is following. Recent echocardiogram from April showed normal systolic function.  History of DVT Unclear when this was diagnosed. Lower extremity Doppler from March shows chronic DVT. Continue anticoagulation. She is currently on IV heparin.  History of hypothyroidism. Continue Synthroid. TSH noted to be slightly elevated about 10 days ago. We'll repeat TSH and check free T4 as well.  DVT Prophylaxis: On IV heparin    Code Status:  Full code.  Family Communication: Discussed with patient's sister  Disposition Plan: Await critical care medicine input. Might need to go to intensive care unit.  Critical care time: 35 minutes spent at bedside managing patient's hypoxia, altered mental status and hypotension   LOS: 0 days   [facility] Pager [phone] 05/22/2017, 11:49 AM  If 7PM-7AM, please contact night-coverage at [email or web address], password [facility]

## 2017-05-29 NOTE — ED Notes (Signed)
Dr. Lamonte Sakai at bedside, made aware of lactic 7.2 and d-dimer 1.86.

## 2017-05-29 NOTE — ED Notes (Signed)
Dr. Lamonte Sakai made aware of GCS, response to Dopamine.

## 2017-05-29 NOTE — Progress Notes (Signed)
   05/26/2017 2100  Clinical Encounter Type  Visited With Patient and family together  Visit Type Patient actively dying  Spiritual Encounters  Spiritual Needs Prayer;Emotional  Stress Factors  Patient Stress Factors Health changes  Family Stress Factors Family relationships  Introduction to family as Pt intubated. Family praying. Also offered prayer of comfort and peace. [employer] of presence.

## 2017-05-29 NOTE — Consult Note (Signed)
PULMONARY / CRITICAL CARE MEDICINE   Name: [name] [name] MRN: [id number] DOB: 27-Jun-1933    ADMISSION DATE:  05/06/2017 CONSULTATION DATE:  05/20/2017  REFERRING MD:  Dr. Alcario Drought   CHIEF COMPLAINT: Dyspnea   HISTORY OF PRESENT ILLNESS:   83 year old female with PMH of A.Fib on Eliquis, HTN, CKD stage 2, CAD, GERD, Hypothyroidism, DVT.   Presents to ED on 5/31 with reported dyspnea (increasing for the last few months), dizziness, and nausea. Reported chest pain for 2-3 days that has since resolved. Patient has noted bilateral lower extremity edema that has not much worse then usual. Upon arrival to ED HR 40-50, alert and oriented, creatinine 1.93, BNP 373 with oxygen saturation 89 on room air. Cardiology consulted, believed to be due to verapamil. Patient deemed appropriate for step-down bed. During stay in ED became lethargic, ABG 7.335/26.5/55 and Lactic acid 5.3. Systolic 26R-48N. Patient placed on BiPAP however due to nausea and dry heaving was taken off for the risk of aspiration. CT Head ordered and PCCM asked to consult.   Of note patient went to walk in clinic on 5/16 with complainants of HTN, SOB, and chest pains, however was unable to stay for appointment because she had to take husband to appointment.   PAST MEDICAL HISTORY :  She  has a past medical history of Allergic rhinitis, cause unspecified; Anxiety and depression; Atrial fibrillation ([place]) (05/08/2017); Atrial septal aneurysm (05/08/2017); Avitaminosis D (05/08/2017); Carotid artery plaque, bilateral (05/08/2017); Chronic headaches; Chronic venous insufficiency (04/28/2015); Constipation (08/29/2014); Coronary artery disease; DJD (degenerative joint disease); Family history of malignant neoplasm of gastrointestinal tract; GERD (gastroesophageal reflux disease); H/O: hysterectomy; Hyperlipidemia; Hypertension; Hypothyroidism; Internal hemorrhoids (07/30/2011); Orthostatic hypotension; Osteoarthrosis, unspecified whether generalized or  localized, unspecified site; Osteoporosis; Postthrombotic syndrome (06/23/2012); Rectal bleeding (08/29/2014); TI (tricuspid incompetence) (05/08/2017); Unspecified functional disorder of intestine; and Varicose veins of leg with complications (4/62/7035).  PAST SURGICAL HISTORY: She  has a past surgical history that includes Cardiac catheterization; Total knee arthroplasty (Right); Nasal sinus surgery; Tonsillectomy; Appendectomy; cyst removal on back; Cervical spine surgery; Knee surgery (Left); Abdominal hysterectomy; Cataract extraction w/ intraocular lens implant (Bilateral); and Ganglion cyst excision (Bilateral).  Allergies  Allergen Reactions  . Isosorbide Dinitrate Other (See Comments)    Other reaction(s): severe headaches  . Prednisone Other (See Comments)    Heart palpitations  . Amoxicillin Swelling    Lip/throat swelling  . Sudafed [Pseudoephedrine] Other (See Comments)    Other reaction(s): rash  . Aspirin Other (See Comments)    Baby aspirin is tolerated, itching in the past  . Amoxicillin-Pot Clavulanate Swelling and Other (See Comments)    Other reaction(s): Facial and tounge swelling  . Cephalexin Other (See Comments)  . Percocet [Oxycodone-Acetaminophen] Other (See Comments)  . Sulfa Antibiotics Other (See Comments)  . Sulfonamide Derivatives Other (See Comments)    Unknown reaction a long time ago    No current facility-administered medications on file prior to encounter.    Current Outpatient Prescriptions on File Prior to Encounter  Medication Sig  . acetaminophen (TYLENOL) 500 MG tablet Take 500 mg by mouth every 6 (six) hours as needed for moderate pain.   Marland Kitchen atorvastatin (LIPITOR) 10 MG tablet Take 10 mg by mouth daily.  Marland Kitchen ELIQUIS 5 MG TABS tablet TAKE 1 TABLET BY MOUTH TWICE A DAY  . furosemide (LASIX) 40 MG tablet Take 1 tablet (40 mg total) by mouth daily.  Marland Kitchen levothyroxine (SYNTHROID, LEVOTHROID) 112 MCG tablet Take 112 mcg by mouth  daily before breakfast.   . potassium chloride (K-DUR) 10 MEQ tablet Take 1 tablet (10 mEq total) by mouth daily.  . valsartan (DIOVAN) 80 MG tablet Take 80 mg by mouth 2 (two) times daily.  . verapamil (CALAN-SR) 180 MG CR tablet Take 180 mg by mouth at bedtime.    FAMILY HISTORY:  Her indicated that her mother is deceased. She indicated that her father is deceased. She indicated that her maternal grandmother is deceased. She indicated that her maternal grandfather is deceased. She indicated that her paternal grandmother is deceased. She indicated that her paternal grandfather is deceased. She indicated that her maternal aunt is deceased. She indicated that her maternal uncle is deceased. She indicated that the status of her neg hx is unknown. She indicated that the status of her other is unknown.    SOCIAL HISTORY: She  reports that she has never smoked. She has never used smokeless tobacco. She reports that she does not drink alcohol or use drugs.  REVIEW OF SYSTEMS:   Unable to review as patient is confused and lethargic   SUBJECTIVE:  Currently in ED, confused, states she is at sisters house and is experiencing nausea.   VITAL SIGNS: BP 101/76   Pulse (!) 45   Resp 18   Ht 5\' 5"  (1.651 m)   Wt 61.2 kg (135 lb)   SpO2 (!) 71%   BMI 22.47 kg/m   HEMODYNAMICS:    VENTILATOR SETTINGS:    INTAKE / OUTPUT: I/O last 3 completed shifts: In: 500 [IV Piggyback:500] Out: -   PHYSICAL EXAMINATION: General:  Elderly female, no distress  Neuro:  Lethargic, confused, moves all extremities, pupils intake  HEENT:  Normocephalic  Cardiovascular:  Brady, no MRG, no JVD Lungs:  Clear breath sounds, no wheeze, non-labored  Abdomen:  Non-tender, active bowel sounds  Musculoskeletal:  +2 edema to BLE Skin:  Abrasion to right shin  LABS:  BMET  Recent Labs Lab 05/18/2017 0202  NA 133*  K 4.7  CL 101  CO2 17*  BUN 34*  CREATININE 1.93*  GLUCOSE 122*    Electrolytes  Recent Labs Lab  05/12/2017 0202  CALCIUM 9.1    CBC  Recent Labs Lab 05/06/2017 0202  WBC 8.7  HGB 10.4*  HCT 34.4*  PLT 279    Coag's  Recent Labs Lab 04/29/2017 0657  APTT 39*    Sepsis Markers  Recent Labs Lab 05/14/2017 0449 05/28/2017 0810  LATICACIDVEN 2.81* 5.3*    ABG  Recent Labs Lab 05/28/2017 0845  PHART 7.335*  PCO2ART 26.5*  PO2ART 55.0*    Liver Enzymes No results for input(s): AST, ALT, ALKPHOS, BILITOT, ALBUMIN in the last 168 hours.  Cardiac Enzymes No results for input(s): TROPONINI, PROBNP in the last 168 hours.  Glucose  Recent Labs Lab 05/15/2017 0719  GLUCAP 103*    Imaging Dg Chest 2 View  Result Date: 05/06/2017 CLINICAL DATA:  Acute onset of worsening generalized weakness and difficulty breathing. Exertional dyspnea. Initial encounter. EXAM: CHEST  2 VIEW COMPARISON:  Chest radiograph performed 03/14/2017, and CTA of the chest performed 05/19/2017 FINDINGS: The lungs are well-aerated and clear. There is no evidence of focal opacification, pleural effusion or pneumothorax. The heart is enlarged. No acute osseous abnormalities are seen. Scattered calcification is noted along the proximal abdominal aorta. IMPRESSION: 1. No acute cardiopulmonary process seen. 2. Cardiomegaly. 3. Scattered aortic atherosclerosis. Electronically Signed   By: Garald Balding M.D.   On: 04/30/2017 02:24  Dg Chest Port 1 View  Result Date: 05/03/2017 CLINICAL DATA:  Hypoxia, confusion, bradycardia EXAM: PORTABLE CHEST 1 VIEW COMPARISON:  05/13/2017 FINDINGS: Stable cardiomegaly. Mild vascular congestion without convincing edema. No air bronchogram, effusion, or pneumothorax. IMPRESSION: Cardiomegaly and vascular congestion. Electronically Signed   By: Monte Fantasia M.D.   On: 05/27/2017 09:53     STUDIES:  ECHO 4/24 > EF 56-38, systolic function normal, PA pressure 34  CXR 5/31 > No acute, Cardiomegaly, scattered aortic atherosclerosis  CT Head 5/31 > No acute, mild atrophic  changes noted  CT-PA chest 5/21 >> no PE, no significant infiltrates  CULTURES: Blood 5/31 >> Urine 5/31 >>  ANTIBIOTICS: None.   SIGNIFICANT EVENTS: 5/31 > Presents to ED   LINES/TUBES: PIV   DISCUSSION: 83 year old female presents with progressive dyspnea and dizziness. Found to be bradycardiac. During stay in ED became lethargic. Head CT negative for acute process.   ASSESSMENT / PLAN:  PULMONARY A: Acute Hypoxic Respiratory Failure  -Did not tolerate Bipap due to nausea  -CTA Chest negative 5/21 P:   Maintain Saturation >92 Pulmonary Hygiene   CARDIOVASCULAR A:  Bradycardia  -Given Calcium Gluconate and Atropine in ED  -Recent ECHO with EF 75-64, normal systolic function  H/O HTN, CAD, A.fib   P:  Cardiology Following  Cardiac Monitoring  Maintain MAP >65 Hold Eliquis > Continue Heparin Gtt  Hold home Verapamil and Lasix   RENAL A:   Lactic Acidosis  Acute on Chronic CKD (Base Crt 1.1-1.2) P:   Trend BMP Replace electrolytes as needed  Trend Lactic Acid   GASTROINTESTINAL A:   GERD Nausea  P:   NPO PPI Zofran PRN  HEMATOLOGIC A:   Anemia of Chronic Diease  P:  Trend CBC  Maintain Hbg >7   INFECTIOUS A:   Septic Shock with unknown etiology vs progression of CKD with uremia  -afebrile, no leukocytosis, CXR clear  P:   Trend Fever and WBC curve  Trend Procal > if positive will start antibiotics  Trend Lactic Acid  Follow culture data   ENDOCRINE A:   Hypothyroidism    P:   Continue Synthroid  Trend Glucose   NEUROLOGIC A:   Metabolic vs Uremic Encephalopathy  P:   RASS goal: 0 Monitor  Hold Sedating medications  UDS pending   FAMILY  - Updates: Family updated at bedside   - Inter-disciplinary family meet or Palliative Care meeting due by:  06/04/2017   CC Time: 65 minutes   Hayden Pedro, AGACNP-BC [facility]  Pgr: [phone]  PCCM Pgr: [phone]   Attending Note:  I have  examined patient, reviewed labs, studies and notes. I have discussed the case with Beaulah Corin, and I agree with the data and plans as amended above. 83 yo woman with hx DVT, A Fib on Eliquis, HTN, CAD, CKD (stage 2), hypothyroidism. She has experienced progressive dyspnea, dizziness for at least a month. Prompted w/u that included reassuring TTE, normal Ct-PA. more recently had CP, progressive LE edema. In the ED was awake and responsive but noted to have bradycardia and hypotension, hypoxemia on RA, new worsening of chronic renal failure. There was some question as to whether she may have mistakenly taken too much of her home verapamil - was treated for this empirically. Has experienced progressive lethargy, hypotension, hypoxemia. Continues to be bradycardic. On my eval she is difficult to rouse. Manual BP is 09'N systolic, HR 49. She does briefly wake to  sternal rub. Lungs are clear. Heart bradycardic but regular. 2+ pretibial edema. Repeat ABG shows progressive metabolic acidosis with partial resp compensation. No clinical or lab evidence to support an active infection. ECG does not suggest MI or primary cardiac event. Certainly this could be medication effects (verapamil) with decline in MS and progressive lactic acidosis due to shock state. Must consider other causes including PE (although on eliquis at home), occult septic shock. Consider hypothyroidism.   Will start dopamine now, recheck lactate and BMP, stat thyroid labs. Hopefully her MS will improve with her BP on dopa. May need to consider intubation for both MS and hypoxemia. Consider repeat Ct-PA but note S Cr may preclude. She is on empiric heparin in the meantime. If her BP does not respond to dopa will place CVC. If she remains hypotensive will start empiric abx. Pct is pending. Needs ICU bed.   Independent critical care time is 45 minutes.   Baltazar Apo, MD, PhD 04/29/2017, 12:15 PM [facility] [phone] or if no  answer [phone]

## 2017-05-29 NOTE — ED Notes (Signed)
CBG 82 

## 2017-05-29 NOTE — Progress Notes (Signed)
CRITICAL VALUE ALERT  Critical Value:  Lactic acid 15.3  Date & Time Notied:  05/06/2017  Provider Notified: MD Oletta Darter  Orders Received/Actions taken: MD Oletta Darter ordering additional labs and sending practitioner to bedside to speak with family per family request.

## 2017-05-29 NOTE — ED Triage Notes (Signed)
Pt from home via [facility]. Pt c/o increased weakness & difficulty breathing w/ exertional dyspnea x1 wk. Per EMS pt was 88% on RA, brought up to 95% on NRB. Denies pain @ this time. Hx of DVT & afib. A&O x4 on arrival.

## 2017-05-29 NOTE — Progress Notes (Addendum)
CRITICAL VALUE ALERT  Critical Value:  APTT > 200  Date & Time Notied:  05/19/2017 at 2305   Provider Notified: called eLink, MD's in shift change report. RN notified eLink RN of critical lab value and eLink RN to report to MD.   Received return call from [place], critical aPTT needs to be reported to pharmacy.  Called pharmacy, spoke with Santiago Glad and was advised to hold heparin for one hour and pharmacy will place new orders for heparin rate.

## 2017-05-30 ENCOUNTER — Inpatient Hospital Stay (HOSPITAL_COMMUNITY): Payer: Medicare Other

## 2017-05-30 DIAGNOSIS — I2510 Atherosclerotic heart disease of native coronary artery without angina pectoris: Secondary | ICD-10-CM

## 2017-05-30 LAB — BLOOD GAS, ARTERIAL
Acid-base deficit: 22.9 mmol/L — ABNORMAL HIGH (ref 0.0–2.0)
BICARBONATE: 4.9 mmol/L — AB (ref 20.0–28.0)
Drawn by: 244861
FIO2: 60
O2 Saturation: 91.6 %
PEEP: 5 cmH2O
PO2 ART: 83.8 mmHg (ref 83.0–108.0)
Patient temperature: 94
RATE: 35 resp/min
VT: 460 mL
pH, Arterial: 7.169 — CL (ref 7.350–7.450)

## 2017-05-30 LAB — POCT I-STAT 3, ART BLOOD GAS (G3+)
Acid-base deficit: 20 mmol/L — ABNORMAL HIGH (ref 0.0–2.0)
Acid-base deficit: 18 mmol/L — ABNORMAL HIGH (ref 0.0–2.0)
BICARBONATE: 7.7 mmol/L — AB (ref 20.0–28.0)
Bicarbonate: 8.6 mmol/L — ABNORMAL LOW (ref 20.0–28.0)
O2 Saturation: 99 %
O2 Saturation: 100 %
PCO2 ART: 18.8 mmHg — AB (ref 32.0–48.0)
PCO2 ART: 17.8 mmHg — AB (ref 32.0–48.0)
PO2 ART: 157 mmHg — AB (ref 83.0–108.0)
Patient temperature: 91
TCO2: 8 mmol/L (ref 0–100)
TCO2: 9 mmol/L (ref 0–100)
pH, Arterial: 7.195 — CL (ref 7.350–7.450)
pH, Arterial: 7.266 — ABNORMAL LOW (ref 7.350–7.450)
pO2, Arterial: 201 mmHg — ABNORMAL HIGH (ref 83.0–108.0)

## 2017-05-30 LAB — LACTIC ACID, PLASMA
LACTIC ACID, VENOUS: 17.1 mmol/L — AB (ref 0.5–1.9)
Lactic Acid, Venous: 22.8 mmol/L (ref 0.5–1.9)

## 2017-05-30 LAB — HEPARIN LEVEL (UNFRACTIONATED): Heparin Unfractionated: >2.2 IU/mL — ABNORMAL HIGH (ref 0.30–0.70)

## 2017-05-30 LAB — ECHOCARDIOGRAM LIMITED
CHL CUP RV SYS PRESS: 26 mmHg
CHL CUP TV REG PEAK VELOCITY: 238 cm/s
Height: 65 in
LA vol A4C: 63.8 ml
LAVOL: 69.2 mL
LAVOLIN: 38.4 mL/m2
LV dias vol: 45 mL — AB (ref 46–106)
LV e' LATERAL: 8.7 cm/s
LVDIAVOLIN: 25 mL/m2
LVSYSVOL: 34 mL (ref 14–42)
LVSYSVOLIN: 19 mL/m2
Lateral S' vel: 6.74 cm/s
Simpson's disk: 25
Stroke v: 11 ml
TAPSE: 12.9 mm
TDI e' lateral: 8.7
TDI e' medial: 8.7
TR max vel: 238 cm/s
Weight: 2465.62 oz

## 2017-05-30 LAB — URINE CULTURE: CULTURE: NO GROWTH

## 2017-05-30 LAB — COOXEMETRY PANEL
Carboxyhemoglobin: 0.3 % — ABNORMAL LOW (ref 0.5–1.5)
Methemoglobin: 1 % (ref 0.0–1.5)
O2 Saturation: 77.4 %
Total hemoglobin: 10.1 g/dL — ABNORMAL LOW (ref 12.0–16.0)

## 2017-05-30 LAB — COMPREHENSIVE METABOLIC PANEL
ALT: 858 U/L — ABNORMAL HIGH (ref 14–54)
AST: 1611 U/L — ABNORMAL HIGH (ref 15–41)
Albumin: 2.3 g/dL — ABNORMAL LOW (ref 3.5–5.0)
Alkaline Phosphatase: 146 U/L — ABNORMAL HIGH (ref 38–126)
Anion gap: 34 — ABNORMAL HIGH (ref 5–15)
BUN: 37 mg/dL — ABNORMAL HIGH (ref 6–20)
CHLORIDE: 97 mmol/L — AB (ref 101–111)
CO2: 7 mmol/L — AB (ref 22–32)
CREATININE: 3.01 mg/dL — AB (ref 0.44–1.00)
Calcium: 7.4 mg/dL — ABNORMAL LOW (ref 8.9–10.3)
GFR calc non Af Amer: 13 mL/min — ABNORMAL LOW (ref >60)
GFR, EST AFRICAN AMERICAN: 16 mL/min — AB (ref >60)
Glucose, Bld: 120 mg/dL — ABNORMAL HIGH (ref 65–99)
POTASSIUM: 4.3 mmol/L (ref 3.5–5.1)
SODIUM: 138 mmol/L (ref 135–145)
Total Bilirubin: 2.5 mg/dL — ABNORMAL HIGH (ref 0.3–1.2)
Total Protein: 4 g/dL — ABNORMAL LOW (ref 6.5–8.1)

## 2017-05-30 LAB — BASIC METABOLIC PANEL
BUN: 37 mg/dL — ABNORMAL HIGH (ref 6–20)
CHLORIDE: 101 mmol/L (ref 101–111)
CREATININE: 2.88 mg/dL — AB (ref 0.44–1.00)
Calcium: 8 mg/dL — ABNORMAL LOW (ref 8.9–10.3)
GFR calc non Af Amer: 14 mL/min — ABNORMAL LOW (ref >60)
GFR, EST AFRICAN AMERICAN: 16 mL/min — AB (ref >60)
Glucose, Bld: 159 mg/dL — ABNORMAL HIGH (ref 65–99)
POTASSIUM: 4.9 mmol/L (ref 3.5–5.1)
Sodium: 132 mmol/L — ABNORMAL LOW (ref 135–145)

## 2017-05-30 LAB — CBC
HCT: 31.3 % — ABNORMAL LOW (ref 36.0–46.0)
Hemoglobin: 9.3 g/dL — ABNORMAL LOW (ref 12.0–15.0)
MCH: 26.1 pg (ref 26.0–34.0)
MCHC: 29.7 g/dL — AB (ref 30.0–36.0)
MCV: 87.9 fL (ref 78.0–100.0)
PLATELETS: 243 10*3/uL (ref 150–400)
RBC: 3.56 MIL/uL — ABNORMAL LOW (ref 3.87–5.11)
RDW: 16.1 % — AB (ref 11.5–15.5)
WBC: 13.3 10*3/uL — ABNORMAL HIGH (ref 4.0–10.5)

## 2017-05-30 LAB — GLUCOSE, CAPILLARY
GLUCOSE-CAPILLARY: 131 mg/dL — AB (ref 65–99)
Glucose-Capillary: 96 mg/dL (ref 65–99)

## 2017-05-30 LAB — PROCALCITONIN: PROCALCITONIN: 3.7 ng/mL

## 2017-05-30 LAB — LACTATE DEHYDROGENASE: LDH: 4212 U/L — ABNORMAL HIGH (ref 98–192)

## 2017-05-30 LAB — TROPONIN I

## 2017-05-30 LAB — APTT: aPTT: >200 seconds (ref 24–36)

## 2017-05-30 LAB — BETA-HYDROXYBUTYRIC ACID: Beta-Hydroxybutyric Acid: 0.23 mmol/L (ref 0.05–0.27)

## 2017-05-30 LAB — MRSA PCR SCREENING: MRSA BY PCR: NEGATIVE

## 2017-05-30 MED ORDER — VASOPRESSIN 20 UNIT/ML IV SOLN
0.0300 [IU]/min | INTRAVENOUS | Status: DC
  Administered 2017-05-30: 0.03 [IU]/min via INTRAVENOUS
  Filled 2017-05-30 (×2): qty 2

## 2017-05-30 MED ORDER — SODIUM BICARBONATE 8.4 % IV SOLN
100.0000 meq | Freq: Once | INTRAVENOUS | Status: AC
Start: 2017-05-30 — End: 2017-05-30
  Administered 2017-05-30: 100 meq via INTRAVENOUS
  Filled 2017-05-30: qty 100

## 2017-05-30 MED ORDER — VANCOMYCIN HCL IN DEXTROSE 1-5 GM/200ML-% IV SOLN
1000.0000 mg | INTRAVENOUS | Status: DC
  Administered 2017-05-30: 1000 mg via INTRAVENOUS
  Filled 2017-05-30: qty 200

## 2017-05-30 MED ORDER — LEVOFLOXACIN IN D5W 500 MG/100ML IV SOLN
500.0000 mg | INTRAVENOUS | Status: DC
  Administered 2017-05-30: 500 mg via INTRAVENOUS
  Filled 2017-05-30: qty 100

## 2017-05-30 MED ORDER — SODIUM BICARBONATE 8.4 % IV SOLN
200.0000 meq | Freq: Once | INTRAVENOUS | Status: AC
  Administered 2017-05-30: 200 meq via INTRAVENOUS
  Filled 2017-05-30: qty 50
  Filled 2017-05-30: qty 200

## 2017-05-30 MED ORDER — HEPARIN (PORCINE) IN NACL 100-0.45 UNIT/ML-% IJ SOLN: 500.0000 [IU]/h | INTRAMUSCULAR | Status: DC

## 2017-05-30 MED ORDER — ORAL CARE MOUTH RINSE
15.0000 mL | OROMUCOSAL | Status: DC
  Administered 2017-05-30 (×3): 15 mL via OROMUCOSAL

## 2017-05-30 MED ORDER — FLUCONAZOLE IN SODIUM CHLORIDE 200-0.9 MG/100ML-% IV SOLN
200.0000 mg | INTRAVENOUS | Status: DC
  Administered 2017-05-30: 200 mg via INTRAVENOUS
  Filled 2017-05-30 (×2): qty 100

## 2017-05-30 MED ORDER — CHLORHEXIDINE GLUCONATE 0.12% ORAL RINSE (MEDLINE KIT)
15.0000 mL | Freq: Two times a day (BID) | OROMUCOSAL | Status: DC
  Administered 2017-05-30 (×2): 15 mL via OROMUCOSAL

## 2017-05-30 MED ORDER — EPINEPHRINE PF 1 MG/10ML IJ SOSY
PREFILLED_SYRINGE | INTRAMUSCULAR | Status: AC
  Filled 2017-05-30: qty 20

## 2017-05-30 MED ORDER — DOPAMINE-DEXTROSE 3.2-5 MG/ML-% IV SOLN
8.0000 ug/kg/min | INTRAVENOUS | Status: DC
  Administered 2017-05-30: 20 ug/kg/min via INTRAVENOUS
  Filled 2017-05-30: qty 250

## 2017-05-30 DEATH — deceased

## 2017-05-31 LAB — HEPATITIS PANEL, ACUTE
HCV Ab: 0.1 s/co ratio (ref 0.0–0.9)
HEP B C IGM: NEGATIVE
Hep A IgM: NEGATIVE
Hepatitis B Surface Ag: NEGATIVE

## 2017-06-02 ENCOUNTER — Telehealth: Payer: Self-pay

## 2017-06-02 ENCOUNTER — Other Ambulatory Visit: Payer: Medicare Other

## 2017-06-02 NOTE — Telephone Encounter (Signed)
On 06/02/17 I received a death certificate from [place] (original). The death certificate is for burial. The patient is a patient of Doctor Titus Mould. The death certificate will be taken to E-Link this pm for signature.  On June 16, 2017 I received the death certificate back from Doctor Titus Mould. I got the death certificate ready and called the funeral home to let them know the death certificate is ready for pickup.

## 2017-06-03 LAB — CULTURE, BLOOD (ROUTINE X 2)
CULTURE: NO GROWTH
Culture: NO GROWTH
Special Requests: ADEQUATE

## 2017-06-29 NOTE — Progress Notes (Signed)
ABG results from 0330 not currently showing in Epic: pH  7.26 PCO2 17.8 PO2 201 Bicarbonate 8.6  Results called and reported to [employer].

## 2017-06-29 NOTE — Progress Notes (Signed)
WIS 100 fentanyl with Altha Harm, Therapist, sports.

## 2017-06-29 NOTE — Progress Notes (Signed)
CRITICAL VALUE ALERT  Critical Value:  Lactic acid 22.8  Date & Time Notied:  06-19-2017 at 0640  Provider Notified:  MD Titus Mould on unit making rounds, will discuss with MD when MD rounds on patient.

## 2017-06-29 NOTE — Progress Notes (Signed)
  Echocardiogram 2D Echocardiogram has been performed.  [name] [name] 06-23-17, 8:30 AM

## 2017-06-29 NOTE — Progress Notes (Signed)
ANTICOAGULATION CONSULT NOTE - Follow-up Consult  Pharmacy Consult for Heparin (while Eliquis on hold) Indication: atrial fibrillation  Allergies  Allergen Reactions  . Isosorbide Dinitrate Other (See Comments)    Other reaction(s): severe headaches  . Prednisone Other (See Comments)    Heart palpitations  . Amoxicillin Swelling    Lip/throat swelling  . Sudafed [Pseudoephedrine] Other (See Comments)    Other reaction(s): rash  . Aspirin Other (See Comments)    Baby aspirin is tolerated, itching in the past  . Amoxicillin-Pot Clavulanate Swelling and Other (See Comments)    Other reaction(s): Facial and tounge swelling  . Cephalexin Other (See Comments)  . Percocet [Oxycodone-Acetaminophen] Other (See Comments)  . Sulfa Antibiotics Other (See Comments)  . Sulfonamide Derivatives Other (See Comments)    Unknown reaction a long time ago    Patient Measurements: Height: 5\' 5"  (165.1 cm) Weight: 154 lb 1.6 oz (69.9 kg) IBW/kg (Calculated) : 57 Heparin Dosing Weight: 61 kg  Vital Signs: Temp: 94 F (34.4 C) (06/01 0800) Temp Source: Rectal (06/01 0800) BP: 63/50 (06/01 1137) Pulse Rate: 88 (06/01 1137)  Labs:  Recent Labs  05/02/2017 0202 05/13/2017 0657 05/12/2017 1336 05/23/2017 1800 05/01/2017 1830 05/13/2017 2000 06-26-17 0001 26-Jun-2017 0125 June 26, 2017 0456 2017-06-26 1043  HGB 10.4*  --   --   --   --   --   --  9.3*  --   --   HCT 34.4*  --   --   --   --   --   --  31.3*  --   --   PLT 279  --   --   --   --   --   --  243  --   --   APTT  --  39*  --   --   --  >200*  --   --   --  >200*  HEPARINUNFRC  --  >2.20*  --   --   --   --   --   --   --  >2.20*  CREATININE 1.93*  --  2.47* 2.85*  --   --  2.88*  --  3.01*  --   TROPONINI  --   --  <0.03  --  <0.03  --  <0.03  --   --   --     Estimated Creatinine Clearance: 14.1 mL/min (A) (by C-G formula based on SCr of 3.01 mg/dL (H)).  Assessment: 83 y.o. F presents with weakness and dyspnea. Pt on Eliquis PTA for afib.  Last dose taken 5/30 2100. Eliquis on hold with acute renal dysfunction (SCr up to 2.85, baseline ~1.2). Eliquis affecting heparin levels (>2.2) so will utilize PTT for heparin monitoring until PTT and heparin level correlate.  PTT remains supratherapeutic at >200 sec on heparin 700 units/hr. Nurse reports that level was drawn from A-line away from where heparin is infusing. No issues with infusion or bleeding.   Goal of Therapy:  Heparin level 0.3-0.7 units/ml aPTT 66-102 seconds Monitor platelets by anticoagulation protocol: Yes   Plan:  Hold heparin for 1h and restart at 500 units/hr 8h aPTT Daily aPTT/HL Monitor s/sx of bleeding   Andrey Cota. Diona Foley, PharmD, [place] Clinical Pharmacist [phone] 2017/06/26,12:13 PM

## 2017-06-29 NOTE — Progress Notes (Signed)
[place] Progress Note Patient Name: [name] [name] DOB: 25-Nov-1933 MRN: [id number]   Date of Service  24-Jun-2017  HPI/Events of Note  ABG = 6.887/19.3/214.0/< 5 and CVP = 21. No role for fluid bolus given CVP.  eICU Interventions  Will order: 1. NaHCO3 200 meq IV now. 2. Increase NaHCO3 IV infusion to 125 mL/hour. 3. Repeat ABG at 2 AM. 4. Await CBC results.      Intervention Category Major Interventions: Acid-Base disturbance - evaluation and management Intermediate Interventions: Respiratory distress - evaluation and management  Sommer,Steven Eugene Jun 24, 2017, 12:57 AM

## 2017-06-29 NOTE — Progress Notes (Signed)
Progress Note  Patient Name: [name] [name] Date of Encounter: 06/16/2017  Primary Cardiologist: Dr Harrington Challenger  Patient Profile     83 y.o. female w/ hx perm afib on Eliquis, HTN, CKD II, was admitted 05/30 w/ SOB, dizziness, bradycardia>>verapamil and Eliquis held. Pt developed profound shock, intubated and on pressors, externally pacing to keep HR > 60.  Subjective   Intubated and sedated, family in the room. Pt not currently getting paced, maintaining HR  Inpatient Medications    Scheduled Meds: . chlorhexidine gluconate (MEDLINE KIT)  15 mL Mouth Rinse BID  . EPINEPHrine      . levothyroxine  56 mcg Intravenous Daily  . mouth rinse  15 mL Mouth Rinse 10 times per day  . pantoprazole (PROTONIX) IV  40 mg Intravenous Q24H  . sodium chloride flush  3 mL Intravenous Q12H   Continuous Infusions: . sodium chloride    . sodium chloride    . DOPamine 8 mcg/kg/min (June 16, 2017 0830)  . fentaNYL infusion INTRAVENOUS 50 mcg/hr (June 16, 2017 0453)  . fluconazole (DIFLUCAN) IV    . heparin 700 Units/hr (Jun 16, 2017 0045)  . levofloxacin (LEVAQUIN) IV    . norepinephrine (LEVOPHED) Adult infusion 90 mcg/min (2017-06-16 0830)  .  sodium bicarbonate (isotonic) infusion in sterile water 125 mL/hr at 06-16-2017 0051  . vancomycin    . vasopressin (PITRESSIN) infusion - *FOR SHOCK* 0.03 Units/min (06-16-2017 0204)   PRN Meds: sodium chloride, Place/Maintain arterial line **AND** sodium chloride, fentaNYL (SUBLIMAZE) injection, fentaNYL (SUBLIMAZE) injection, midazolam, midazolam, ondansetron (ZOFRAN) IV   Vital Signs    Vitals:   16-Jun-2017 0615 Jun 16, 2017 0630 Jun 16, 2017 0645 06/16/2017 0700  BP: (!) 90/55 (!) 84/60 (!) 84/60 (!) 85/56  Pulse:      Resp: (!) 27 (!) 28 (!) 27 (!) 28  Temp:      TempSrc:      SpO2:      Weight:      Height:        Intake/Output Summary (Last 24 hours) at 06/16/17 0831 Last data filed at Jun 16, 2017 0700  Gross per 24 hour  Intake          6012.14 ml  Output               100 ml  Net          5912.14 ml   Filed Weights   05/05/2017 0113 05/19/2017 2030 Jun 16, 2017 0500  Weight: 135 lb (61.2 kg) 147 lb 14.9 oz (67.1 kg) 154 lb 1.6 oz (69.9 kg)    Telemetry    Atrial fib, external pacer now off with HR 80s - Personally Reviewed  ECG    n/a - Personally Reviewed  Physical Exam   General: Well developed, well nourished, female  On the vent Head: Normocephalic, atraumatic.  Neck: Supple without bruits, JVD not assessed 2nd lines and equipment. Lungs:  Resp regular and unlabored, clear anteriorly. Heart: Irreg R&R, S1, S2, no S3, S4, or murmur; no rub. Abdomen: distended with no bowel sounds. No hepatomegaly. No obvious abdominal masses. Extremities: No clubbing, cyanosis, no edema. Distal pedal pulses are 2+ bilaterally. Neuro: sedated on the vent.  Labs    Hematology  Recent Labs Lab 05/14/2017 0202 06-16-17 0125  WBC 8.7 13.3*  RBC 4.08 3.56*  HGB 10.4* 9.3*  HCT 34.4* 31.3*  MCV 84.3 87.9  MCH 25.5* 26.1  MCHC 30.2 29.7*  RDW 16.5* 16.1*  PLT 279 243    Chemistry Recent Labs Lab 05/26/2017 1135  05/24/2017 1800 06/28/17 0001 2017-06-28 0456  NA  --   < > 133* 132* 138  K  --   < > 4.8 4.9 4.3  CL  --   < > 103 101 97*  CO2  --   < > <7* <7* 7*  GLUCOSE  --   < > 183* 159* 120*  BUN  --   < > 37* 37* 37*  CREATININE  --   < > 2.85* 2.88* 3.01*  CALCIUM  --   < > 8.2* 8.0* 7.4*  PROT 5.5*  --   --   --  4.0*  ALBUMIN 3.2*  --   --   --  2.3*  AST 216*  --   --   --  1,611*  ALT 108*  --   --   --  858*  ALKPHOS 125  --   --   --  146*  BILITOT 2.5*  --   --   --  2.5*  GFRNONAA  --   < > 14* 14* 13*  GFRAA  --   < > 17* 16* 16*  ANIONGAP  --   < > NOT CALCULATED NOT CALCULATED 34*  < > = values in this interval not displayed.   Cardiac Enzymes  Recent Labs Lab 05/28/2017 1336 05/05/2017 1830 06-28-2017 0001  TROPONINI <0.03 <0.03 <0.03     Recent Labs Lab 05/19/2017 0206  TROPIPOC 0.01     BNP  Recent Labs Lab  05/07/2017 0200  BNP 373.1*     DDimer   Recent Labs Lab 05/28/2017 1052  DDIMER 1.86*     Radiology    Ct Abdomen Pelvis Wo Contrast Result Date: 05/21/2017 CLINICAL DATA:  Sepsis EXAM: CT ABDOMEN AND PELVIS WITHOUT CONTRAST TECHNIQUE: Multidetector CT imaging of the abdomen and pelvis was performed following the standard protocol without IV contrast. COMPARISON:  Abdomen radiographs from earlier the same day, CT from 08/06/2005 FINDINGS: Lower chest: The visualized cardiac chambers are borderline enlarged without pericardial effusion or significant thickening. Small bilateral pleural effusions right greater than left with compressive atelectasis. Gastric tube is seen in the distal esophagus with tip extending into the stomach. Hepatobiliary: The gallbladder and liver are unremarkable for this unenhanced study. No biliary dilatation. No definite space-occupying mass given the limitations of a noncontrast study. Pancreas: Mild atrophy of the pancreatic body and tail. No ductal dilatation or mass. Spleen: Normal size spleen without definite mass. Adrenals/Urinary Tract: No focal adrenal mass. No nephrolithiasis nor obstructive uropathy. Streak artifacts from patient's arm limit assessment through portions of the kidneys. The urinary bladder is decompressed by a Foley catheter. Stomach/Bowel: There is contrast distention of the stomach. The tip of a gastric tube is seen in the antrum. There is normal small bowel rotation. No significant small bowel dilatation. No bowel obstruction is noted. Large bowel is decompressed in appearance and slightly thickened as a result. Appendectomy by report. Vascular/Lymphatic: Aorto iliac atherosclerosis. No aneurysm. No pelvic side wall, retroperitoneal or upper abdominal adenopathy. Reproductive: Hysterectomy.  No adnexal mass. Other: Small amount of ascites within the abdomen and pelvis with anasarca. Surgical sutures are seen along the infra umbilical ventral abdomen.  Musculoskeletal: Degenerative disc disease along the thoracolumbar spine. No acute fracture. Mild spinal stenosis at L3-4 secondary to facet and endplate hypertrophy. IMPRESSION: 1. Bilateral pleural effusions, anasarca and ascites consistent with right heart failure and third spacing of fluid. 2. No CT findings to explain the patient's sepsis on this unenhanced  study. Electronically Signed   By: Ashley Royalty M.D.   On: 05/02/2017 20:05   Dg Chest 2 View Result Date: 05/13/2017 CLINICAL DATA:  Acute onset of worsening generalized weakness and difficulty breathing. Exertional dyspnea. Initial encounter. EXAM: CHEST  2 VIEW COMPARISON:  Chest radiograph performed 03/14/2017, and CTA of the chest performed 05/19/2017 FINDINGS: The lungs are well-aerated and clear. There is no evidence of focal opacification, pleural effusion or pneumothorax. The heart is enlarged. No acute osseous abnormalities are seen. Scattered calcification is noted along the proximal abdominal aorta. IMPRESSION: 1. No acute cardiopulmonary process seen. 2. Cardiomegaly. 3. Scattered aortic atherosclerosis. Electronically Signed   By: Garald Balding M.D.   On: 05/06/2017 02:24   Ct Head Wo Contrast Result Date: 05/04/2017 CLINICAL DATA:  Dizziness EXAM: CT HEAD WITHOUT CONTRAST TECHNIQUE: Contiguous axial images were obtained from the base of the skull through the vertex without intravenous contrast. COMPARISON:  03/11/2016 FINDINGS: Brain: Mild atrophic changes are noted. No findings to suggest acute hemorrhage, acute infarction or space-occupying mass lesion are noted. Vascular: No hyperdense vessel or unexpected calcification. Skull: Normal. Negative for fracture or focal lesion. Sinuses/Orbits: Opacification of the left maxillary antrum is seen. Prior sinus surgery is noted. Other: None IMPRESSION: Mild atrophic changes. Left maxillary sinus disease. Electronically Signed   By: Inez Catalina M.D.   On: 05/12/2017 11:14   Dg Chest Port 1  View  Result Date: 05/20/2017 CLINICAL DATA:  83 year old who presented to the emergency room earlier descent admitted shortness of breath and dyspnea on exertion that have worsened over the past week. Patient had cardiorespiratory arrest while in the emergency department requiring intubation and central venous catheter placement. EXAM: PORTABLE CHEST 1 VIEW COMPARISON:  Portable chest x-ray earlier today 9:40 a.m. Two-view chest x-ray earlier today 1:46 a.m. and previously. CTA chest 05/19/2017. FINDINGS: The endotracheal tube tip projects approximately 2 cm above the carina, though the tube projects slightly to the left of the tracheal air stripe. Right jugular central venous catheter tip projects over the expected location of the mid SVC. No evidence of pneumothorax or mediastinal hematoma. External pacing pads are present. Cardiac silhouette moderately to markedly enlarged. No interval change in pulmonary venous hypertension since earlier today. Worsening aeration in the lung bases with possible small effusions. IMPRESSION: 1. Endotracheal tube tip projects approximately 2 cm above the carina, though the 2 projects slightly to the left of the tracheal air stripe inferiorly. Please exclude esophageal intubation. 2. Central venous catheter tip projects over the mid SVC. No complicating features. 3. Developing atelectasis in the lung bases and possible developing pleural effusions. Stable pulmonary venous hypertension without overt edema. I discussed these results by telephone with the emergency department physician at the time of interpretation on 05/22/2017 at 3:47 p.m. Electronically Signed   By: Evangeline Dakin M.D.   On: 05/28/2017 15:51   Dg Chest Port 1 View Result Date: 05/20/2017 CLINICAL DATA:  Hypoxia, confusion, bradycardia EXAM: PORTABLE CHEST 1 VIEW COMPARISON:  05/25/2017 FINDINGS: Stable cardiomegaly. Mild vascular congestion without convincing edema. No air bronchogram, effusion, or  pneumothorax. IMPRESSION: Cardiomegaly and vascular congestion. Electronically Signed   By: Monte Fantasia M.D.   On: 05/16/2017 09:53   Dg Abd Portable 1 View Result Date: 05/14/2017 CLINICAL DATA:  Orogastric tube placement. EXAM: PORTABLE ABDOMEN - 1 VIEW COMPARISON:  Lumbar spine radiographs dated 02/21/2016. FINDINGS: Orogastric tube tip in the mid stomach and side hole in the proximal to mid stomach. Mild gaseous distention of  the stomach. Normal caliber small bowel loops. No acute bony abnormality. Mild atheromatous arterial calcifications. IMPRESSION: Orogastric tube tip in the mid stomach and side hole in the proximal to mid stomach. Electronically Signed   By: Claudie Revering M.D.   On: 05/05/2017 18:42     Cardiac Studies   ECHO: June 21, 2017 ordered  Patient Profile     83 y.o. female w/ hx perm afib on Eliquis, HTN, CKD II, was admitted 05/30 w/ SOB, dizziness, bradycardia>>verapamil and Eliquis held. Pt developed profound shock, intubated and on pressors, externally pacing to keep HR > 60.  Assessment & Plan      Symptomatic bradycardia   Atrial fibrillation with slow ventricular response (HCC) - external pacing pads in place, not currently needed. - pt has brachial pulses with intrinsic rhythm - no verapamil in > 24 hr - however, BP very low despite high doses of pressors - feel her prognosis is very poor - f/u on echo - continue to pace prn for bradycardia as long as that is appropriate, based on her code status.  Otherwise, per CCM Principal Problem:   Shock [facility]) Active Problems:   Essential hypertension   AKI (acute kidney injury) [facility])  Signed, Lenoard Aden 8:31 AM 06-21-2017 Pager: [phone]   Attending Note:   The patient was seen and examined.  Agree with assessment and plan as noted above.  Changes made to the above note as needed.  Patient seen and independently examined with Rosaria Ferries, PA .   We discussed all aspects of the encounter.  I agree with the assessment and plan as stated above.  1.  Bradycardia:   Thought originally to be due to verapamil. She developed profound acidosis yesterday  Better on bicarb drip and with mechanical ventilation Back up pacing in place although she does not use it continuously .   2.  Acute systolic CHF:  Pre-lim views of echo show moderate LV dysfunction  She is hypotensive, on pressors Her prognosis is very poor.  3.  Acute on chronic kidney disease:  Plans per CCM    I have spent a total of 40 minutes with patient reviewing hospital  notes , telemetry, EKGs, labs and examining patient as well as establishing an assessment and plan that was discussed with the patient. > 50% of time was spent in direct patient care.    Thayer Headings, Brooke Bonito., MD, [facility] 2017/06/21, 10:16 AM [address],  [place] Pager [phone]

## 2017-06-29 NOTE — Progress Notes (Signed)
[place] Progress Note Patient Name: LAVONNE KINDERMAN DOB: 1933/04/24 MRN: [id number]   Date of Service  06/28/2017  HPI/Events of Note  Hgb = 9.3.  eICU Interventions  Will order: 1. COOX now.     Intervention Category Major Interventions: Acid-Base disturbance - evaluation and management  Charly Holcomb Eugene 06/28/17, 1:55 AM

## 2017-06-29 NOTE — Progress Notes (Signed)
Vent changes made per MD order. 

## 2017-06-29 NOTE — Progress Notes (Signed)
[place] Progress Note Patient Name: [name] [name] DOB: 08/11/1933 MRN: [id number]   Date of Service  06/16/2017  HPI/Events of Note  Multiple issues: 1. Hypotension - BP = 95/38 with MAP = 54 by A-line. and 2. Profound Lactic Acidosis - Lactic Acid = 15.3 >> 17.1.  eICU Interventions  Will order: 1. Vasopressin IV infusion at shock dose.      Intervention Category Major Interventions: Acid-Base disturbance - evaluation and management;Hypotension - evaluation and management  Billey Wojciak Cornelia Copa 16-Jun-2017, 1:39 AM

## 2017-06-29 NOTE — Discharge Summary (Signed)
PULMONARY / CRITICAL CARE MEDICINE DEATH SUMMARY  Name: [name] [name] MRN: [id number] DOB: 22-Dec-1933    ADMISSION DATE:  16-Jun-2017 CONSULTATION DATE:  2017-06-16 DATE OF DEATH: Jun 17, 2017  FINAL CAUSE OF DEATH:  Shock, unclear etiology, consider septic and cardiogenic  SECONDARY CAUSES OF DEATH:  Acute encephalopathy Severe bradycardia, etiology unclear Atrial fibrillation Acute respiratory failure with hypoxemia Lactic acidosis/metabolic acidosis Small bilateral pleural effusions Acute on chronic (stage III) renal failure Shock liver GERD Anemia of chronic disease Hypothyroidism History of hypertension History of hyperlipidemia History of CAD History of DVT  CHIEF COMPLAINT: Dyspnea   HISTORY OF PRESENT ILLNESS / HOSPITAL COURSE:   83 year old female with PMH of A.Fib on Eliquis, HTN, CKD stage 2, CAD, GERD, Hypothyroidism, DVT.   Presented to ED on 06/17/2023 with reported dyspnea (increasing for the last few months), dizziness, and nausea. Reported chest pain for 2-3 days that has since resolved. Patient has noted bilateral lower extremity edema that has not much worse then usual. Of note patient went to walk in clinic on 5/16 with complainants of HTN, SOB, and chest pains, however was unable to stay for appointment because she had to take husband to appointment.  Upon arrival to ED HR 40-50, alert and oriented, creatinine 1.93, BNP 373 with oxygen saturation 89 on room air. Cardiology consulted, believed to be due to verapamil. Patient deemed appropriate for step-down bed. During stay in ED became lethargic, ABG 7.335/26.5/55 and Lactic acid 5.3. Systolic 55V-74M. Patient placed on BiPAP however due to nausea and dry heaving was taken off for the risk of aspiration. Experienced progressive lethargy, persistent bradycardia despite initiation of dopamine. She ultimately required intubation for airway protection. There was some suspicion that she may have had an accidental verapamil overdose  and this was treated empirically in the emergency department. This was never able to be substantiated. She unfortunately experienced progressive metabolic acidosis, continued shock with increasing pressor needs. Transcutaneous pacing was initiated but there was never enough capture to achieve a rate greater than 50-60 bpm. CT scan of the chest did not show any explanation. Heparin drip was continued empirically. A CT scan of her abdomen was performed to look for an occult source of sepsis. No contrast transited to her distal bowel, unclear whether there was bowel injury or ischemia. Despite all aggressive interventions the patient did not stabilize. She expired on June 17, 2017 on full support.    Baltazar Apo, MD, PhD 06/19/2017, 11:09 AM [facility] [phone] or if no answer [phone]

## 2017-06-29 NOTE — Consult Note (Signed)
PULMONARY / CRITICAL CARE MEDICINE   Name: [name] [name] MRN: [id number] DOB: 1933-03-05    ADMISSION DATE:  05/22/2017 CONSULTATION DATE:  05/21/2017  REFERRING MD:  Dr. Alcario Drought   CHIEF COMPLAINT: Dyspnea   HISTORY OF PRESENT ILLNESS:   83 year old female with PMH of A.Fib on Eliquis, HTN, CKD stage 2, CAD, GERD, Hypothyroidism, DVT.   Presents to ED on 5/31 with reported dyspnea (increasing for the last few months), dizziness, and nausea. Reported chest pain for 2-3 days that has since resolved. Patient has noted bilateral lower extremity edema that has not much worse then usual. Upon arrival to ED HR 40-50, alert and oriented, creatinine 1.93, BNP 373 with oxygen saturation 89 on room air. Cardiology consulted, believed to be due to verapamil. Patient deemed appropriate for step-down bed. During stay in ED became lethargic, ABG 7.335/26.5/55 and Lactic acid 5.3. Systolic 25K-27C. Patient placed on BiPAP however due to nausea and dry heaving was taken off for the risk of aspiration. CT Head ordered and PCCM asked to consult.   Of note patient went to walk in clinic on 5/16 with complainants of HTN, SOB, and chest pains, however was unable to stay for appointment because she had to take husband to appointment.   SUBJECTIVE:  Lactic acidosis, CT abdo done Externally paced  VITAL SIGNS: BP (!) 85/56   Pulse (!) 233   Temp (!) 93.3 F (34.1 C) (Rectal)   Resp (!) 28   Ht 5\' 5"  (1.651 m)   Wt 69.9 kg (154 lb 1.6 oz)   SpO2 (!) 81%   BMI 25.64 kg/m   HEMODYNAMICS: CVP:  [19 mmHg-21 mmHg] 19 mmHg  VENTILATOR SETTINGS: Vent Mode: PRVC FiO2 (%):  [100 %] 100 % Set Rate:  [28 bmp] 28 bmp Vt Set:  [460 mL-500 mL] 460 mL PEEP:  [5 cmH20] 5 cmH20  INTAKE / OUTPUT: I/O last 3 completed shifts: In: 6512.1 [I.V.:4512.1; IV Piggyback:2000] Out: 100 [Urine:100]  PHYSICAL EXAMINATION: General: on vent,calm Neuro: opening eyes, pupil cataract, sluggish, moves all ext HEENT: ett PULM:  CTA anterior CV: s1 s2 paced external GI: soft, bS low, no r/g Extremities: edema no, no rash   LABS:  BMET  Recent Labs Lab 05/24/2017 1800 06/14/17 0001 06-14-2017 0456  NA 133* 132* 138  K 4.8 4.9 4.3  CL 103 101 97*  CO2 <7* <7* 7*  BUN 37* 37* 37*  CREATININE 2.85* 2.88* 3.01*  GLUCOSE 183* 159* 120*    Electrolytes  Recent Labs Lab 05/11/2017 1037  05/22/2017 1800 06/14/17 0001 June 14, 2017 0456  CALCIUM  --   < > 8.2* 8.0* 7.4*  MG 2.1  --   --   --   --   PHOS 6.7*  --   --   --   --   < > = values in this interval not displayed.  CBC  Recent Labs Lab 05/28/2017 0202 06-14-17 0125  WBC 8.7 13.3*  HGB 10.4* 9.3*  HCT 34.4* 31.3*  PLT 279 243    Coag's  Recent Labs Lab 05/24/2017 0657 05/16/2017 2000  APTT 39* >200*    Sepsis Markers  Recent Labs Lab 05/26/2017 1037  05/19/2017 1747 06/14/2017 0001 06/14/2017 0456  LATICACIDVEN  --   < > 15.3* 17.1* 22.8*  PROCALCITON <0.10  --   --   --  3.70  < > = values in this interval not displayed.  ABG  Recent Labs Lab 05/09/2017 2357 06-14-17 0206 2017/06/14 0341  PHART 6.887* 7.195* 7.266*  PCO2ART 19.3* 18.8* 17.8*  PO2ART 214.0* 157.0* 201.0*    Liver Enzymes  Recent Labs Lab 05/11/2017 1135 06-22-2017 0456  AST 216* 1,611*  ALT 108* 858*  ALKPHOS 125 146*  BILITOT 2.5* 2.5*  ALBUMIN 3.2* 2.3*    Cardiac Enzymes  Recent Labs Lab 05/02/2017 1336 05/11/2017 1830 2017/06/22 0001  TROPONINI <0.03 <0.03 <0.03    Glucose  Recent Labs Lab 05/17/2017 0719 05/01/2017 1639 05/09/2017 2252 Jun 22, 2017 0341  GLUCAP 103* 82 161* 131*    Imaging Ct Abdomen Pelvis Wo Contrast  Result Date: 05/15/2017 CLINICAL DATA:  Sepsis EXAM: CT ABDOMEN AND PELVIS WITHOUT CONTRAST TECHNIQUE: Multidetector CT imaging of the abdomen and pelvis was performed following the standard protocol without IV contrast. COMPARISON:  Abdomen radiographs from earlier the same day, CT from 08/06/2005 FINDINGS: Lower chest: The visualized  cardiac chambers are borderline enlarged without pericardial effusion or significant thickening. Small bilateral pleural effusions right greater than left with compressive atelectasis. Gastric tube is seen in the distal esophagus with tip extending into the stomach. Hepatobiliary: The gallbladder and liver are unremarkable for this unenhanced study. No biliary dilatation. No definite space-occupying mass given the limitations of a noncontrast study. Pancreas: Mild atrophy of the pancreatic body and tail. No ductal dilatation or mass. Spleen: Normal size spleen without definite mass. Adrenals/Urinary Tract: No focal adrenal mass. No nephrolithiasis nor obstructive uropathy. Streak artifacts from patient's arm limit assessment through portions of the kidneys. The urinary bladder is decompressed by a Foley catheter. Stomach/Bowel: There is contrast distention of the stomach. The tip of a gastric tube is seen in the antrum. There is normal small bowel rotation. No significant small bowel dilatation. No bowel obstruction is noted. Large bowel is decompressed in appearance and slightly thickened as a result. Appendectomy by report. Vascular/Lymphatic: Aorto iliac atherosclerosis. No aneurysm. No pelvic side wall, retroperitoneal or upper abdominal adenopathy. Reproductive: Hysterectomy.  No adnexal mass. Other: Small amount of ascites within the abdomen and pelvis with anasarca. Surgical sutures are seen along the infra umbilical ventral abdomen. Musculoskeletal: Degenerative disc disease along the thoracolumbar spine. No acute fracture. Mild spinal stenosis at L3-4 secondary to facet and endplate hypertrophy. IMPRESSION: 1. Bilateral pleural effusions, anasarca and ascites consistent with right heart failure and third spacing of fluid. 2. No CT findings to explain the patient's sepsis on this unenhanced study. Electronically Signed   By: Ashley Royalty M.D.   On: 05/15/2017 20:05   Ct Head Wo Contrast  Result Date:  05/21/2017 CLINICAL DATA:  Dizziness EXAM: CT HEAD WITHOUT CONTRAST TECHNIQUE: Contiguous axial images were obtained from the base of the skull through the vertex without intravenous contrast. COMPARISON:  03/11/2016 FINDINGS: Brain: Mild atrophic changes are noted. No findings to suggest acute hemorrhage, acute infarction or space-occupying mass lesion are noted. Vascular: No hyperdense vessel or unexpected calcification. Skull: Normal. Negative for fracture or focal lesion. Sinuses/Orbits: Opacification of the left maxillary antrum is seen. Prior sinus surgery is noted. Other: None IMPRESSION: Mild atrophic changes. Left maxillary sinus disease. Electronically Signed   By: Inez Catalina M.D.   On: 05/15/2017 11:14   Dg Chest Port 1 View  Result Date: 05/28/2017 CLINICAL DATA:  83 year old who presented to the emergency room earlier descent admitted shortness of breath and dyspnea on exertion that have worsened over the past week. Patient had cardiorespiratory arrest while in the emergency department requiring intubation and central venous catheter placement. EXAM: PORTABLE CHEST 1 VIEW COMPARISON:  Portable  chest x-ray earlier today 9:40 a.m. Two-view chest x-ray earlier today 1:46 a.m. and previously. CTA chest 05/19/2017. FINDINGS: The endotracheal tube tip projects approximately 2 cm above the carina, though the tube projects slightly to the left of the tracheal air stripe. Right jugular central venous catheter tip projects over the expected location of the mid SVC. No evidence of pneumothorax or mediastinal hematoma. External pacing pads are present. Cardiac silhouette moderately to markedly enlarged. No interval change in pulmonary venous hypertension since earlier today. Worsening aeration in the lung bases with possible small effusions. IMPRESSION: 1. Endotracheal tube tip projects approximately 2 cm above the carina, though the 2 projects slightly to the left of the tracheal air stripe inferiorly.  Please exclude esophageal intubation. 2. Central venous catheter tip projects over the mid SVC. No complicating features. 3. Developing atelectasis in the lung bases and possible developing pleural effusions. Stable pulmonary venous hypertension without overt edema. I discussed these results by telephone with the emergency department physician at the time of interpretation on 05/25/2017 at 3:47 p.m. Electronically Signed   By: Evangeline Dakin M.D.   On: 05/09/2017 15:51   Dg Chest Port 1 View  Result Date: 05/23/2017 CLINICAL DATA:  Hypoxia, confusion, bradycardia EXAM: PORTABLE CHEST 1 VIEW COMPARISON:  05/05/2017 FINDINGS: Stable cardiomegaly. Mild vascular congestion without convincing edema. No air bronchogram, effusion, or pneumothorax. IMPRESSION: Cardiomegaly and vascular congestion. Electronically Signed   By: Monte Fantasia M.D.   On: 05/11/2017 09:53   Dg Abd Portable 1 View  Result Date: 05/12/2017 CLINICAL DATA:  Orogastric tube placement. EXAM: PORTABLE ABDOMEN - 1 VIEW COMPARISON:  Lumbar spine radiographs dated 02/21/2016. FINDINGS: Orogastric tube tip in the mid stomach and side hole in the proximal to mid stomach. Mild gaseous distention of the stomach. Normal caliber small bowel loops. No acute bony abnormality. Mild atheromatous arterial calcifications. IMPRESSION: Orogastric tube tip in the mid stomach and side hole in the proximal to mid stomach. Electronically Signed   By: Claudie Revering M.D.   On: 05/28/2017 18:42     STUDIES:  ECHO 4/24 > EF 28-36, systolic function normal, PA pressure 34  CXR 5/31 > No acute, Cardiomegaly, scattered aortic atherosclerosis  CT Head 5/31 > No acute, mild atrophic changes noted  CT-PA chest 5/21 >> no PE, no significant infiltrates CT abdo >>>effusion lung small, abdo min contrast in bowels, neg  otherwise  CULTURES: Blood 5/31 >> Urine 5/31 >>  ANTIBIOTICS: Zosyn 6/1>>> vanc 6/1>>> Diflucan 6/1>>>  SIGNIFICANT EVENTS: 5/31 >  Presents to ED   LINES/TUBES: PIV  5/31 ETT>>> Rt ij 5/31>>> A line left fem 5.31>>>  DISCUSSION: 82 year old female presents with progressive dyspnea and dizziness. Found to be bradycardiac. During stay in ED became lethargic. Head CT negative for acute process.   ASSESSMENT / PLAN:  PULMONARY A: Acute Hypoxic Respiratory Failure  -Did not tolerate Bipap due to nausea  -CTA Chest negative 5/21 Small effusions to moderate on rt P:   abg reviewed, keep high MV NO SBT due to high MV Rate to 35 pcxr in am    CARDIOVASCULAR A:  Bradycardia  -Given Calcium Gluconate and Atropine in ED  -Recent ECHO with EF 62-94, normal systolic function  H/O HTN, CAD, A.fib   P:  Cardiology Following  Cardiac Monitoring  Maintain MAP >60 Continue Heparin Gtt  Echo official awaited Pacer wire transven will NOT change outcome NO ROLE ANG 2 Dopmamine to beta affect 8 mic, can increase levo dop  all alpha at current dose Vaso can remain  RENAL A:   Lactic Acidosis  Acute on Chronic CKD (Base Crt 1.1-1.2) P:   Trend BMP Bicarb is not gonna change her outcome and may increase co2, drip can remain but may need to reduce rate Replace electrolytes as needed  Trend Lactic Acid  Would not tolerate cvvhd or any form hd  GASTROINTESTINAL A:   GERD Nausea Shock liver Still worry dead bowel, CT without contrast to bowels P:   NPO PPI Zofran PRN Assess hep panel ldh Left in am   HEMATOLOGIC A:   Anemia of Chronic Diease  Shock liver P:  Trend CBC  Maintain Hbg >7  coags repeat  INFECTIOUS A:   Septic Shock with unknown etiology vs progression of CKD with uremia  -r/o abdominal source  P:   Pct slight up from renal failure Add antifungal Add abdominal coverage, allergy reviewed  ENDOCRINE A:   Hypothyroidism    P:   Continue Synthroid  Trend Glucose  No rel AI  NEUROLOGIC A:   Metabolic vs Uremic Encephalopathy  P:   RASS goal: 0 No WUA fent  FAMILY  -  Updates: Family updated at bedside , I will update them this does not appear to be suvivable  - Inter-disciplinary family meet or Palliative Care meeting due by:  06/04/2017   Ccm time 40 min   Lavon Paganini. Titus Mould, MD, [place] Pgr: [facility]'

## 2017-06-29 NOTE — Progress Notes (Signed)
Pt time of death 1641. 2 RN's (Kaylei Frink Mel Almond and Jettie Booze) listened and observed for 2 minutes. Elink MD notified. Family at bedside. CDS notified.

## 2017-06-29 NOTE — Progress Notes (Signed)
ABG and co-ox results called to MD Oletta Darter, new orders received.

## 2017-06-29 NOTE — Progress Notes (Signed)
RN called MD Titus Mould to inform that when trying to titrate dopamine down to new ordered dose, patients blood pressure immediately began to drop. MD gave verbal orders to keep the dopamine at the previous 20 mcg/kg/min. Will continue to monitor.

## 2017-06-29 NOTE — Progress Notes (Signed)
Pharmacy Antibiotic Note  [name] [name] is a 83 y.o. female admitted on 05/16/2017 with weakness and SOB, now w/ concern for intra-abdominal infection, ischemic bowel vs type B lactic acidosis (now up to 22.8).  Pharmacy has been consulted for Levaquin and Vancocin dosing.  Plan: Vancomycin 1000mg  IV every 48 hours.  Goal trough 15-20 mcg/mL.  Levaquin 500mg  IV every 48 hours. Follow SCr closely and obtain vanc trough as needed.  Height: 5\' 5"  (165.1 cm) Weight: 154 lb 1.6 oz (69.9 kg) IBW/kg (Calculated) : 57  Temp (24hrs), Avg:92.2 F (33.4 C), Min:91 F (32.8 C), Max:93.3 F (34.1 C)   Recent Labs Lab 05/18/2017 0202  05/19/2017 1153 05/25/2017 1336 05/12/2017 1652 05/10/2017 1747 05/09/2017 1800 06-17-17 0001 June 17, 2017 0125 06-17-2017 0456  WBC 8.7  --   --   --   --   --   --   --  13.3*  --   CREATININE 1.93*  --   --  2.47*  --   --  2.85* 2.88*  --  3.01*  LATICACIDVEN  --   < > 7.2*  --  12.9* 15.3*  --  17.1*  --  22.8*  < > = values in this interval not displayed.  Estimated Creatinine Clearance: 14.1 mL/min (A) (by C-G formula based on SCr of 3.01 mg/dL (H)).    Allergies  Allergen Reactions  . Isosorbide Dinitrate Other (See Comments)    Other reaction(s): severe headaches  . Prednisone Other (See Comments)    Heart palpitations  . Amoxicillin Swelling    Lip/throat swelling  . Sudafed [Pseudoephedrine] Other (See Comments)    Other reaction(s): rash  . Aspirin Other (See Comments)    Baby aspirin is tolerated, itching in the past  . Amoxicillin-Pot Clavulanate Swelling and Other (See Comments)    Other reaction(s): Facial and tounge swelling  . Cephalexin Other (See Comments)  . Percocet [Oxycodone-Acetaminophen] Other (See Comments)  . Sulfa Antibiotics Other (See Comments)  . Sulfonamide Derivatives Other (See Comments)    Unknown reaction a long time ago     Thank you for allowing pharmacy to be a part of this patient's care.  Wynona Neat, PharmD, BCPS   06-17-17 7:23 AM

## 2017-06-29 NOTE — Progress Notes (Signed)
[facility] Progress Note Patient Name: [name] [name] DOB: 1933-03-12 MRN: [id number]   Date of Service  06/08/2017  HPI/Events of Note  ABG = 7.26/17.8/201.0/  eICU Interventions  Continue present management.         [name] Eugene 06-08-17, 3:59 AM

## 2017-06-29 NOTE — Progress Notes (Addendum)
CRITICAL VALUE ALERT  Critical Value:  Arterial Blood Gas -  PH 6.887, pCO2 19.3, pO2 214, Bicarbonate 3.7  Date & Time Notied:  June 10, 2017 at 0000  Provider Notified: called eLink and reported value to [place] RN at [place], [place] RN to report value to MD Oletta Darter when he is available.   0055 - received return call from MD Oletta Darter and new orders received for ABG results.  MD Oletta Darter aware of patient's current blood pressure and levophed rate.

## 2017-06-29 NOTE — Progress Notes (Signed)
CRITICAL VALUE ALERT  Critical Value:  Lactic acid 17.1  Date & Time Notied:  06/29/2017  At 0133  Provider Notified: MD Oletta Darter  Orders Received/Actions taken: MD Oletta Darter waiting on hemoglobin result to determine if co-ox to be collected and follow up ABG at 0200.  MD aware of patient's BP and Levophed rate maxed at 80 mcg/min.  New orders for Vasopressin received.

## 2017-06-29 NOTE — Progress Notes (Signed)
Initial Nutrition Assessment  DOCUMENTATION CODES:   Not applicable  INTERVENTION:   -Await poc, recommend initiation of EN as soon as clinically feasible -TF recommendations: VHP @ goal of 35 ml/hr with Pro-stat TID providing 1140 kcals, 119 g of protein and 706 mL of free water. Meets 100% estimated nutrtional needs. Continue to assess   NUTRITION DIAGNOSIS:   Inadequate oral intake related to acute illness as evidenced by NPO status.  GOAL:   Provide needs based on ASPEN/SCCM guidelines  MONITOR:   Vent status, Labs, Weight trends, I & O's  REASON FOR ASSESSMENT:   Ventilator    ASSESSMENT:   83 yo female admitted with acute respiratory failure with spontaneous tension PTX, AECOPD. Pt with hx of HTN, COPD, RUL lung cancer s/p XRT and chemo (2016)  Patient is currently intubated on ventilator support, sedated, currently requiring 3 vasopressors MV: 15.8 L/min Temp (24hrs), Avg:92.8 F (33.8 C), Min:91 F (32.8 C), Max:94 F (34.4 C)  NPO, per MD notes, concern for dead bowel, CT abdomen  OG tube with tip in stomach  Unable to complete Nutrition-Focused physical exam at this time.   Minimal UOP  Labs: Creatinine 3.01, elevated LFTs Meds: fentanyl, versed, sodium bicarb drip, dopamine, levophed, vasopressin  Diet Order:  Diet NPO time specified  Skin:  Reviewed, no issues  Last BM:  no documented BM  Height:   Ht Readings from Last 1 Encounters:  05/09/2017 5\' 5"  (1.651 m)    Weight:   Wt Readings from Last 1 Encounters:  11-Jun-2017 154 lb 1.6 oz (69.9 kg)    Ideal Body Weight:     BMI:  Body mass index is 25.64 kg/m.  Estimated Nutritional Needs:   Kcal:  1136 kcals  Protein:  105-140 g  Fluid:  >/= 1.5 L  EDUCATION NEEDS:   No education needs identified at this time  [place], [place], LDN [phone] Pager  [phone] Weekend/On-Call Pager

## 2017-06-29 NOTE — Progress Notes (Addendum)
[facility] Progress Note Patient Name: PRISCELLA DONNA DOB: [birth year] MRN: [id number]   Date of Service  2017-06-20  HPI/Events of Note  Multiple issues: 1. COOX + 77.4% and 2. ABG = 7.195/18.8/157.0/7.7. Normal COOX and normal EF makes cardiac dysfunction (Ca++ Channel Blocker OD or AMI) unlikely as etiology of lactic Acidosis. Severe liver dysfunction also unlikely. I see no Metformin on her pre hospital medication list. DDX now evolves to Ischemic bowel vs Type B lactic acidosis (DM (blood glucose only = 159), Malignancy, Alcoholism, HIV infection, Beta Adrenergic Agents and Mitochondrial Dysfunction) vs D-Lactic acidosis.   eICU Interventions  Will order:  1. NaHCO3 100 meq IV now. 2. ABG at 3:30 AM. 3. B-Hydroxybutarate now.      Intervention Category Major Interventions: Acid-Base disturbance - evaluation and management  Sommer,Steven Eugene 20-Jun-2017, 2:42 AM

## 2017-06-29 DEATH — deceased

## 2017-08-07 ENCOUNTER — Ambulatory Visit: Payer: Medicare Other | Admitting: Family Medicine
